# Patient Record
Sex: Male | Born: 1940
Health system: Southern US, Academic
[De-identification: ages and names within clinical notes are randomized; demographics above are authoritative.]

## PROBLEM LIST (undated history)

## (undated) ENCOUNTER — Ambulatory Visit

## (undated) ENCOUNTER — Encounter

## (undated) ENCOUNTER — Encounter
Attending: Pharmacist Clinician (PhC)/ Clinical Pharmacy Specialist | Primary: Pharmacist Clinician (PhC)/ Clinical Pharmacy Specialist

## (undated) ENCOUNTER — Encounter: Attending: Medical Oncology | Primary: Medical Oncology

## (undated) ENCOUNTER — Telehealth

## (undated) ENCOUNTER — Institutional Professional Consult (permissible substitution): Payer: MEDICARE

## (undated) ENCOUNTER — Telehealth: Attending: Medical Oncology | Primary: Medical Oncology

## (undated) ENCOUNTER — Encounter: Attending: Nurse Practitioner | Primary: Nurse Practitioner

## (undated) ENCOUNTER — Ambulatory Visit: Payer: MEDICARE

## (undated) ENCOUNTER — Encounter
Attending: Student in an Organized Health Care Education/Training Program | Primary: Student in an Organized Health Care Education/Training Program

## (undated) ENCOUNTER — Ambulatory Visit: Payer: MEDICARE | Attending: Medical Oncology | Primary: Medical Oncology

## (undated) ENCOUNTER — Ambulatory Visit
Payer: MEDICARE | Attending: Student in an Organized Health Care Education/Training Program | Primary: Student in an Organized Health Care Education/Training Program

## (undated) ENCOUNTER — Telehealth
Attending: Student in an Organized Health Care Education/Training Program | Primary: Student in an Organized Health Care Education/Training Program

## (undated) ENCOUNTER — Telehealth: Attending: Hematology & Oncology | Primary: Hematology & Oncology

## (undated) ENCOUNTER — Institutional Professional Consult (permissible substitution): Payer: MEDICARE | Attending: Medical Oncology | Primary: Medical Oncology

## (undated) ENCOUNTER — Ambulatory Visit: Attending: Radiation Oncology | Primary: Radiation Oncology

## (undated) ENCOUNTER — Ambulatory Visit
Payer: MEDICARE | Attending: Pharmacist Clinician (PhC)/ Clinical Pharmacy Specialist | Primary: Pharmacist Clinician (PhC)/ Clinical Pharmacy Specialist

## (undated) ENCOUNTER — Ambulatory Visit: Payer: MEDICARE | Attending: Hematology & Oncology | Primary: Hematology & Oncology

## (undated) ENCOUNTER — Encounter: Attending: Family Medicine | Primary: Family Medicine

## (undated) ENCOUNTER — Telehealth
Attending: Pharmacist Clinician (PhC)/ Clinical Pharmacy Specialist | Primary: Pharmacist Clinician (PhC)/ Clinical Pharmacy Specialist

## (undated) ENCOUNTER — Telehealth: Attending: Registered" | Primary: Registered"

## (undated) ENCOUNTER — Encounter: Attending: Internal Medicine | Primary: Internal Medicine

## (undated) ENCOUNTER — Encounter: Attending: Orthopaedic Surgery of the Spine | Primary: Orthopaedic Surgery of the Spine

## (undated) DIAGNOSIS — I10 Essential (primary) hypertension: Secondary | ICD-10-CM

## (undated) DIAGNOSIS — N419 Inflammatory disease of prostate, unspecified: Secondary | ICD-10-CM

## (undated) DIAGNOSIS — E785 Hyperlipidemia, unspecified: Secondary | ICD-10-CM

## (undated) HISTORY — PX: BACK SURGERY: SHX140

## (undated) MED ORDER — FERROUS SULFATE 325 MG (65 MG IRON) TABLET: Freq: Every day | ORAL | 0 days

## (undated) MED ORDER — IBUPROFEN 600 MG TABLET: Freq: Three times a day (TID) | ORAL | 0.00000 days | PRN

---
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
## 2006-08-30 ENCOUNTER — Emergency Department: Payer: Self-pay | Admitting: Emergency Medicine

## 2006-09-12 ENCOUNTER — Ambulatory Visit: Payer: Self-pay | Admitting: Urology

## 2007-02-02 ENCOUNTER — Emergency Department: Payer: Self-pay

## 2009-03-06 ENCOUNTER — Emergency Department: Payer: Self-pay | Admitting: Emergency Medicine

## 2011-08-18 ENCOUNTER — Ambulatory Visit: Payer: Self-pay | Admitting: Internal Medicine

## 2011-08-23 ENCOUNTER — Ambulatory Visit: Payer: Self-pay | Admitting: Oncology

## 2011-08-24 ENCOUNTER — Ambulatory Visit: Payer: Self-pay | Admitting: Oncology

## 2011-08-25 LAB — PROT IMMUNOELECTROPHORES(ARMC)

## 2011-08-25 LAB — CEA: CEA: 2.7 ng/mL (ref 0.0–4.7)

## 2011-09-24 ENCOUNTER — Ambulatory Visit: Payer: Self-pay | Admitting: Oncology

## 2012-05-27 ENCOUNTER — Emergency Department: Payer: Self-pay | Admitting: Internal Medicine

## 2012-05-27 LAB — COMPREHENSIVE METABOLIC PANEL
Alkaline Phosphatase: 127 U/L (ref 50–136)
Anion Gap: 13 (ref 7–16)
Bilirubin,Total: 0.5 mg/dL (ref 0.2–1.0)
Calcium, Total: 9.4 mg/dL (ref 8.5–10.1)
Chloride: 104 mmol/L (ref 98–107)
Co2: 25 mmol/L (ref 21–32)
Creatinine: 1.29 mg/dL (ref 0.60–1.30)
EGFR (African American): >60
EGFR (Non-African Amer.): 56 — ABNORMAL LOW
SGOT(AST): 35 U/L (ref 15–37)
SGPT (ALT): 26 U/L (ref 12–78)

## 2012-05-27 LAB — CBC
HCT: 40.2 % (ref 40.0–52.0)
HGB: 13.8 g/dL (ref 13.0–18.0)
MCV: 92 fL (ref 80–100)
RBC: 4.37 10*6/uL — ABNORMAL LOW (ref 4.40–5.90)
WBC: 9.6 10*3/uL (ref 3.8–10.6)

## 2012-05-27 LAB — URINALYSIS, COMPLETE
Ketone: NEGATIVE
Leukocyte Esterase: NEGATIVE
Nitrite: NEGATIVE
Ph: 6 (ref 4.5–8.0)
Protein: 75
Specific Gravity: 1.02 (ref 1.003–1.030)
WBC UR: NONE SEEN /HPF (ref 0–5)

## 2012-05-27 LAB — LIPASE, BLOOD: Lipase: 172 U/L (ref 73–393)

## 2013-11-06 ENCOUNTER — Inpatient Hospital Stay: Payer: Self-pay | Admitting: Urology

## 2013-11-06 LAB — COMPREHENSIVE METABOLIC PANEL
ALK PHOS: 108 U/L
AST: 30 U/L (ref 15–37)
Albumin: 4.2 g/dL (ref 3.4–5.0)
Anion Gap: 6 — ABNORMAL LOW (ref 7–16)
BUN: 29 mg/dL — ABNORMAL HIGH (ref 7–18)
Bilirubin,Total: 0.5 mg/dL (ref 0.2–1.0)
Calcium, Total: 9 mg/dL (ref 8.5–10.1)
Chloride: 102 mmol/L (ref 98–107)
Co2: 24 mmol/L (ref 21–32)
Creatinine: 3.02 mg/dL — ABNORMAL HIGH (ref 0.60–1.30)
EGFR (Non-African Amer.): 20 — ABNORMAL LOW
GFR CALC AF AMER: 23 — AB
GLUCOSE: 99 mg/dL (ref 65–99)
OSMOLALITY: 270 (ref 275–301)
POTASSIUM: 3.9 mmol/L (ref 3.5–5.1)
SGPT (ALT): 21 U/L (ref 12–78)
SODIUM: 132 mmol/L — AB (ref 136–145)
Total Protein: 9 g/dL — ABNORMAL HIGH (ref 6.4–8.2)

## 2013-11-06 LAB — URINALYSIS, COMPLETE
Bilirubin,UR: NEGATIVE
GLUCOSE, UR: NEGATIVE mg/dL (ref 0–75)
Ketone: NEGATIVE
Nitrite: NEGATIVE
PH: 5 (ref 4.5–8.0)
RBC,UR: 893 /HPF (ref 0–5)
Specific Gravity: 1.01 (ref 1.003–1.030)
Squamous Epithelial: <1
WBC UR: 10 /HPF (ref 0–5)

## 2013-11-06 LAB — CBC
HCT: 42.6 % (ref 40.0–52.0)
HGB: 14.1 g/dL (ref 13.0–18.0)
MCH: 30.6 pg (ref 26.0–34.0)
MCHC: 33.1 g/dL (ref 32.0–36.0)
MCV: 92 fL (ref 80–100)
Platelet: 243 10*3/uL (ref 150–440)
RBC: 4.61 10*6/uL (ref 4.40–5.90)
RDW: 13.7 % (ref 11.5–14.5)
WBC: 7.8 10*3/uL (ref 3.8–10.6)

## 2013-11-06 LAB — CK TOTAL AND CKMB (NOT AT ARMC)
CK, TOTAL: 705 U/L — AB
CK-MB: 0.5 ng/mL (ref 0.5–3.6)

## 2013-11-06 LAB — TROPONIN I

## 2013-11-07 LAB — BASIC METABOLIC PANEL
Anion Gap: 5 — ABNORMAL LOW (ref 7–16)
BUN: 28 mg/dL — ABNORMAL HIGH (ref 7–18)
CHLORIDE: 103 mmol/L (ref 98–107)
CO2: 27 mmol/L (ref 21–32)
Calcium, Total: 8.6 mg/dL (ref 8.5–10.1)
Creatinine: 3.05 mg/dL — ABNORMAL HIGH (ref 0.60–1.30)
EGFR (African American): 23 — ABNORMAL LOW
EGFR (Non-African Amer.): 20 — ABNORMAL LOW
GLUCOSE: 92 mg/dL (ref 65–99)
Osmolality: 275 (ref 275–301)
POTASSIUM: 4.1 mmol/L (ref 3.5–5.1)
Sodium: 135 mmol/L — ABNORMAL LOW (ref 136–145)

## 2013-11-07 LAB — CBC WITH DIFFERENTIAL/PLATELET
BASOS PCT: 0.7 %
Basophil #: 0 10*3/uL (ref 0.0–0.1)
Eosinophil #: 0.2 10*3/uL (ref 0.0–0.7)
Eosinophil %: 4.1 %
HCT: 37 % — ABNORMAL LOW (ref 40.0–52.0)
HGB: 12.4 g/dL — ABNORMAL LOW (ref 13.0–18.0)
LYMPHS ABS: 1.5 10*3/uL (ref 1.0–3.6)
LYMPHS PCT: 30.1 %
MCH: 30.9 pg (ref 26.0–34.0)
MCHC: 33.6 g/dL (ref 32.0–36.0)
MCV: 92 fL (ref 80–100)
Monocyte #: 0.8 x10 3/mm (ref 0.2–1.0)
Monocyte %: 15.8 %
Neutrophil #: 2.5 10*3/uL (ref 1.4–6.5)
Neutrophil %: 49.3 %
Platelet: 187 10*3/uL (ref 150–440)
RBC: 4.03 10*6/uL — AB (ref 4.40–5.90)
RDW: 13.8 % (ref 11.5–14.5)
WBC: 5 10*3/uL (ref 3.8–10.6)

## 2013-11-08 LAB — BASIC METABOLIC PANEL
ANION GAP: 5 — AB (ref 7–16)
BUN: 21 mg/dL — ABNORMAL HIGH (ref 7–18)
CALCIUM: 8.3 mg/dL — AB (ref 8.5–10.1)
CO2: 26 mmol/L (ref 21–32)
Chloride: 107 mmol/L (ref 98–107)
Creatinine: 2.44 mg/dL — ABNORMAL HIGH (ref 0.60–1.30)
EGFR (African American): 30 — ABNORMAL LOW
GFR CALC NON AF AMER: 26 — AB
Glucose: 79 mg/dL (ref 65–99)
Osmolality: 278 (ref 275–301)
POTASSIUM: 4.4 mmol/L (ref 3.5–5.1)
SODIUM: 138 mmol/L (ref 136–145)

## 2013-11-08 LAB — URINE CULTURE

## 2013-11-08 LAB — MAGNESIUM: MAGNESIUM: 1.8 mg/dL

## 2013-11-09 LAB — BASIC METABOLIC PANEL
ANION GAP: 4 — AB (ref 7–16)
BUN: 17 mg/dL (ref 7–18)
CREATININE: 1.72 mg/dL — AB (ref 0.60–1.30)
Calcium, Total: 8.4 mg/dL — ABNORMAL LOW (ref 8.5–10.1)
Chloride: 109 mmol/L — ABNORMAL HIGH (ref 98–107)
Co2: 27 mmol/L (ref 21–32)
EGFR (African American): 45 — ABNORMAL LOW
GFR CALC NON AF AMER: 39 — AB
GLUCOSE: 84 mg/dL (ref 65–99)
Osmolality: 280 (ref 275–301)
Potassium: 4.3 mmol/L (ref 3.5–5.1)
Sodium: 140 mmol/L (ref 136–145)

## 2013-11-15 ENCOUNTER — Ambulatory Visit: Payer: Self-pay | Admitting: Urology

## 2013-12-26 ENCOUNTER — Ambulatory Visit: Payer: Self-pay | Admitting: Urology

## 2014-01-10 ENCOUNTER — Ambulatory Visit: Payer: Self-pay | Admitting: Urology

## 2014-01-22 ENCOUNTER — Ambulatory Visit: Payer: Self-pay | Admitting: Urology

## 2014-01-22 LAB — CBC WITH DIFFERENTIAL/PLATELET
Basophil #: 0 10*3/uL (ref 0.0–0.1)
Basophil %: 0.8 %
EOS PCT: 4.3 %
Eosinophil #: 0.2 10*3/uL (ref 0.0–0.7)
HCT: 37.4 % — ABNORMAL LOW (ref 40.0–52.0)
HGB: 12.4 g/dL — AB (ref 13.0–18.0)
Lymphocyte #: 1.8 10*3/uL (ref 1.0–3.6)
Lymphocyte %: 49.2 %
MCH: 30.1 pg (ref 26.0–34.0)
MCHC: 33.1 g/dL (ref 32.0–36.0)
MCV: 91 fL (ref 80–100)
Monocyte #: 0.5 x10 3/mm (ref 0.2–1.0)
Monocyte %: 12.6 %
NEUTROS ABS: 1.2 10*3/uL — AB (ref 1.4–6.5)
Neutrophil %: 33.1 %
Platelet: 142 10*3/uL — ABNORMAL LOW (ref 150–440)
RBC: 4.12 10*6/uL — ABNORMAL LOW (ref 4.40–5.90)
RDW: 15.5 % — ABNORMAL HIGH (ref 11.5–14.5)
WBC: 3.7 10*3/uL — ABNORMAL LOW (ref 3.8–10.6)

## 2014-01-22 LAB — APTT: Activated PTT: 26.4 secs (ref 23.6–35.9)

## 2014-01-22 LAB — POTASSIUM: Potassium: 3.6 mmol/L (ref 3.5–5.1)

## 2014-01-22 LAB — PROTIME-INR
INR: 1.1
Prothrombin Time: 14.2 secs (ref 11.5–14.7)

## 2014-01-23 LAB — CBC WITH DIFFERENTIAL/PLATELET
Basophil #: 0 10*3/uL (ref 0.0–0.1)
Basophil %: 0.1 %
Eosinophil #: 0 10*3/uL (ref 0.0–0.7)
Eosinophil %: 0 %
HCT: 30.8 % — AB (ref 40.0–52.0)
HGB: 10.1 g/dL — AB (ref 13.0–18.0)
Lymphocyte #: 1.1 10*3/uL (ref 1.0–3.6)
Lymphocyte %: 12.2 %
MCH: 30 pg (ref 26.0–34.0)
MCHC: 32.8 g/dL (ref 32.0–36.0)
MCV: 91 fL (ref 80–100)
Monocyte #: 0.8 x10 3/mm (ref 0.2–1.0)
Monocyte %: 8.8 %
NEUTROS ABS: 7.2 10*3/uL — AB (ref 1.4–6.5)
Neutrophil %: 78.9 %
Platelet: 148 10*3/uL — ABNORMAL LOW (ref 150–440)
RBC: 3.37 10*6/uL — ABNORMAL LOW (ref 4.40–5.90)
RDW: 15.6 % — ABNORMAL HIGH (ref 11.5–14.5)
WBC: 9.1 10*3/uL (ref 3.8–10.6)

## 2014-01-23 LAB — MAGNESIUM: Magnesium: 1.6 mg/dL — ABNORMAL LOW

## 2014-01-23 LAB — BASIC METABOLIC PANEL
ANION GAP: 3 — AB (ref 7–16)
BUN: 20 mg/dL — ABNORMAL HIGH (ref 7–18)
CREATININE: 1.47 mg/dL — AB (ref 0.60–1.30)
Calcium, Total: 7.9 mg/dL — ABNORMAL LOW (ref 8.5–10.1)
Chloride: 108 mmol/L — ABNORMAL HIGH (ref 98–107)
Co2: 26 mmol/L (ref 21–32)
EGFR (Non-African Amer.): 47 — ABNORMAL LOW
GFR CALC AF AMER: 54 — AB
GLUCOSE: 127 mg/dL — AB (ref 65–99)
Osmolality: 278 (ref 275–301)
Potassium: 4.4 mmol/L (ref 3.5–5.1)
Sodium: 137 mmol/L (ref 136–145)

## 2014-12-14 NOTE — Discharge Summary (Signed)
PATIENT NAME:  [name] [name], [name] [name] MR#:  [id number] DATE OF BIRTH:  Aug 12, 1941  DATE OF ADMISSION:  11/06/2013 DATE OF DISCHARGE:  11/09/2013  PRINCIPAL DIAGNOSIS: Bilateral nephrolithiasis, right ureterolithiasis with obstruction, acute renal failure.   PROCEDURE: Right ureteroscopy with holmium laser lithotripsy, right double-J ureteral stent placement.   INDICATION: The patient is a 74 year old gentleman with a long history of uric acid nephrolithiasis. He presented to the Emergency Room with right-sided flank pain. A CT scan was obtained demonstrating a large segment of stone obstructing the mid right ureter. The segment was approximately 2.3 to 2.5 cm in length. He also had a large staghorn left calculus compromising renal function. There were other stones also within the right kidney. His serum creatinine had risen to 3. He was admitted for IV hydration, observation and intervention as indicated.   HOSPITAL COURSE: [name] [name] was admitted on 11/06/2013. He was placed on IV hydration and antibiotic therapy. The following morning labs demonstrated no significant change in his overall renal function status. Due to the large obstructing stone within the right ureter and the large left staghorn calculus with renal compromise, the decision was made to proceed with ureteroscopic stone removal. He was taken to the operating room on 11/07/2013 at which time he underwent right ureteroscopy with holmium laser lithotripsy and stent placement. There were no intraoperative problems or complications. His postoperative course was likewise without significant problems or complications. He remained afebrile, vital signs stable. He had minimal pain and discomfort. He had minimal hematuria. He had minimal overall discomfort related to the stent. He remained afebrile, vital signs stable throughout his hospitalization. His pain was easily controlled on oral pain medication. He had subsequent gradual reduction in his serum  creatinine over the course of the hospitalization. It had decreased to 1.72 by March 20th. This demonstrated a significant continued decrease with the stent placement. He was subsequently discharged to home on his usual admission medications in addition to Cipro 500 mg for a 5 day course and Percocet 1 to 2 every 4 to 6 hours as needed for pain. He is to follow up in 1 to 2 weeks for cystoscopy and stent removal. He will need eventual intervention regarding the left staghorn calculus. He is to notify us if there are any further problems or questions in the interim.   ____________________________ Denice Bors Jacqlyn Larsen, MD bsc:sb D: 11/13/2013 12:11:54 ET T: 11/13/2013 12:31:17 ET JOB#: [id number]  cc: Denice Bors. Jacqlyn Larsen, MD, <Dictator> Denice Bors Cruz Devilla MD ELECTRONICALLY SIGNED 11/13/2013 05:69

## 2014-12-14 NOTE — Consult Note (Signed)
Pt doing well.UOP, minimal bloodcontrolled on oral pain meds.Good.close PCN today.CVAT.no pain, will remove PCN tomorrow.to chair today.post op care.  Electronic Signatures: Murrell Redden (MD)  (Signed on 03-Jun-15 07:55)  Authored  Last Updated: 03-Jun-15 07:55 by Murrell Redden (MD)

## 2014-12-14 NOTE — Op Note (Signed)
PATIENT NAME:  [name] [name], [name] [name] MR#:  [id number] DATE OF BIRTH:  12/21/1940  DATE OF PROCEDURE:  11/07/2013  PRINCIPAL DIAGNOSIS:  Right ureterolithiasis, acute renal failure, hydronephrosis.   POSTOPERATIVE DIAGNOSIS:  Right ureterolithiasis, acute renal failure, hydronephrosis.  PROCEDURE:  Right ureteroscopy with holmium laser lithotripsy, right double-J ureteral stent placement.   SURGEON:  Edrick Oh, M.D.   ANESTHESIA:  General endotracheal anesthesia.   INDICATIONS:  The patient is a 74 year old African American gentleman with metastatic prostate cancer controlled on hormonal manipulation.  He also has a history of uric acid nephrolithiasis.  He presented to the Emergency Room with right-sided flank pain.  A CT scan demonstrated a 2.3 cm x 6 mm stone just below the level of the crossing vessels on the right with moderate hydronephrosis.  There was no evidence of sepsis.  His serum creatinine was elevated over 3.0 with his baseline being around 1.1.  He was admitted for IV hydration and observation.  There was persistent elevation of his serum creatinine.  He also has a large staghorn calculus on the left.  He presents for ureteroscopic stone removal.   DESCRIPTION OF PROCEDURE:  After informed consent was obtained, the patient was taken to the Operating Room and placed in the dorsal lithotomy position under general endotracheal anesthesia.  The patient was then prepped and draped in the usual standard fashion.  The 20 French rigid cystoscope was introduced into the urethra under direct vision with no urethral abnormalities noted.  Upon entering the prostatic fossa, moderate bilobar hypertrophy was noted with only partial visual obstruction.  The prostate fossa was noted to be relatively short.  Upon entering the bladder, the mucosa was inspected in its entirety with no gross mucosal lesions noted.  Multiple small sand-like uric acid stone fragments were present on the bladder base.  Blood was  noted oozing from the right ureteral orifice.  The left ureteral orifice demonstrated no significant abnormalities.  A flexible tip Glidewire was introduced into the left ureteral orifice.  Approximately 2 to 3 cm inside the orifice resistance was encountered.  Multiple attempts were made at passing the guidewire and this was initially unsuccessful, however the cystoscope was removed, the 6 French rigid ureteroscope was advanced into the urinary bladder.  It was advanced into the right ureteral orifice utilizing the guidewire through the scope.  The scope was advanced into the distal ureter to the level of the stones.  The guidewire was able to be advanced past the stone into the upper pole collecting system under fluoroscopic guidance without further difficulty.  The ureteroscope was removed.  The guidewire was left in place.  The ureteroscope was then replaced into the ureter to the level of the stone.  The holmium laser fiber was then utilized to fragment a large lead stone.  The initial CT imaging indicated a possible solid stone.  The initial stone was fragmented into multiple smaller pieces.  It fragmented into essentially dust consistent with uric acid-type stone.  As the bulk of the larger stone was removed, multiple smaller stones were encountered.  As these were basket extracted free additional larger stones were encountered.  The ureter appeared to be filled with multiple small and larger stones to the level of the crossing vessels.  This was approximately 3 cm in length total.  Multiple passes were made with the basket for extraction of the stone fragments.  Significant edema was noted of the ureter.  After the bulk of the pieces had been basket extracted  the scope was advanced to the level of the ureteropelvic junction.  Prominent dilation was noted in the ureter proximal to the edge of the stones.  No other significant abnormalities were appreciated.  Due to the degree of edema and his renal failure  status the decision was made to proceed with stent placement.  The ureteroscope was removed.  The cystoscope was replaced into the urinary bladder, backloaded over the guidewire.  A 6 French x 24 cm double-J ureteral stent was advanced over the guidewire into the upper pole collecting system under fluoroscopic and visual guidance.  Adequate curl was noted within the renal pelvis.  Upon withdrawal of the guidewire, adequate curl was also noted within the urinary bladder.  The bladder was irrigated free of the multiple stone fragments.  These were collected and will be sent for stone analysis.  The bladder was then drained.  The cystoscope was removed.  The patient was returned to the supine position and awakened from general endotracheal anesthesia.  He was taken to the recovery room in stable condition.  There were no problems or complications.  The patient tolerated the procedure well.    ____________________________ Denice Bors. Jacqlyn Larsen, MD bsc:ea D: 11/08/2013 19:59:00 ET T: 11/09/2013 03:35:51 ET JOB#: [id number]  cc: Denice Bors. Jacqlyn Larsen, MD, <Dictator> Denice Bors. Jacqlyn Larsen, MD Denice Bors [name] MD ELECTRONICALLY SIGNED 11/09/2013 12:37

## 2014-12-14 NOTE — Op Note (Signed)
PATIENT NAME:  [name] [name], [name] [name] MR#:  [id number] DATE OF BIRTH:  October 16, 1940  DATE OF PROCEDURE:  01/22/2014  PRINCIPAL DIAGNOSIS: Left uric acid nephrolithiasis.   POSTOPERATIVE DIAGNOSIS: Left uric acid nephrolithiasis.   PROCEDURE: Left percutaneous nephrolithotomy.   SURGEON: Edrick Oh, MD   ANESTHESIA: General endotracheal anesthesia.   INDICATIONS: The patient is a 74 year old gentleman who presented recently with right-sided ureteral obstruction related to uric acid stones. He underwent ureteroscopic stone removal. He was noted to have significant effect on renal function. He was noted to have a large stone burden with multiple large stones within the left kidney with minimal obstruction. He has recovered from his initial right stone treatment. He presents for percutaneous nephrolithotomy due to the large stone burden within the left kidney. He underwent nephroureteral stent placement by interventional radiology earlier in the morning. He presents for this purpose.   DESCRIPTION OF PROCEDURE: After informed consent was obtained, the patient was taken to the operating room and placed in the prone position on the operating table under general endotracheal anesthesia. The patient was then prepped and draped in the usual standard fashion. The previously placed nephroureteral stent was utilized. A flexible tip Glidewire was introduced through the nephroureteral stent into the urinary bladder. The nephroureteral stent was then removed under fluoroscopic guidance and dilators were then utilized to dilate the tract from 10-French to 14-French. The stiffener was then placed over the guidewire. The sheath was then placed over the stiffener. A Super Stiff guidewire was advanced through the sheath into the urinary bladder under fluoroscopic guidance. The sheath was then removed. The stiffener was then replaced over the Super Stiff guidewire. The flexible Glidewire was placed to the side as a safety wire. An  approximate 1 cm skin incision was made utilizing a knife. The dilation was then undertaken utilizing sequential dilators from 16-French to 28-French. The sheath was left on the 28-French dilator. The sheath was then advanced into the kidney on initial examination. Multiple very small stones  were encountered.  Many of these were very small and irrigated free through the sheath. Some clot was present within the calyces. This was removed utilizing the ultrasonic device and suction. An approximate 8 mm to 10 mm stone was encountered. This was then removed utilizing the ultrasonic lithotripter. An approximate 2 cm to 2.5 cm stone was then encountered within the renal pelvic region. This was also removed utilizing the ultrasonic lithotriptor. At least 2 additional stones were noted in calyces in very close proximity. Each was also approximately 1 cm in size. These were also removed utilizing the ultrasonic lithotripter. The scope was advanced into the lower pole calyces. Several additional 1 cm stones were identified. Multiple small stones were present. Most of these were either suctioned with the ultrasonic device or removed with irrigation. The larger stones were fragmented. One of the smaller stones was removed for stone analysis. This was performed utilizing graspers. Several additional fragments were also removed utilizing graspers for stone analysis. The scope was unable to be advanced into the more upper pole portion of the kidney with the bulk of the stones and fragments removed. A flexible ureteroscope was advanced into the upper pole calyces. One small stone was present in the upper pole calyx. This was washed back into the renal pelvis. There was an additional area of a calyx which could not be easily identified due to compression from a large upper pole renal cyst. Based on imaging, a small stone was present within this calyx. This  was unable to be accessed. The scope was removed. The rigid scope was  replaced back into the renal pelvis. Several additional smaller stones and fragments were removed utilizing the ultrasonic probe. There was 1 fragment approximately 6 mm in the proximal ureter. This was able to be removed utilizing grasping forceps through the rigid scope. After the bulk of the fragments and tiny stones were removed, the decision was made to place a stent. It was initially attempted over a second glidewire placed through the scope into the ureter and urinary bladder. As the stent was advanced, resistance was met in the distal ureter. We were not able to get adequate curl as had been hoped. The stent was removed utilizing grasping forceps. The guidewire was replaced back into the ureter. The stent was advanced through the scope under direct vision instead of fluoroscopic guidance. The guidewire was able to be advanced into the urinary bladder. The curl within the bladder was more than sufficient. The curl, however, in the renal pelvis was only partial. Several attempts were made at grasping the stent utilizing grasping forceps. This was difficult due to angulation. The decision was made to leave the stent at its current location due to concerns over potential injury from continued scope angulation. The scope was then removed. A 10-French nephrostomy tube was then placed through the sheath. The tip was present within the renal pelvis as the string was pulled to provide curl. Some resistance was met. Manipulation of the nephrostomy tube was undertaken with subsequent curl. Bright red blood was noted coming through the sheath and nephrostomy tube. The amount of bleeding and color did provide concern. As a result, the nephrostomy tube was removed. The [facility] tamponade balloon catheter was then placed through the sheath. The sheath was then removed over and around the tamponade catheter. The catheter was then inflated to maximum pressure. It was held for approximately 15 minutes. The tamponade catheter was  then deflated. No significant bleeding was noted. It was observed that the previously placed nephrostomy tube did not have the safety tip on the end as it should. It was noted that this was not present at the time of initial insertion. A second nephrostomy tube was obtained that did have the safety tip in place. This was placed without difficulty under fluoroscopic guidance. Good curl was noted within the renal pelvis. No significant bleeding was encountered. A nephrostogram was then performed demonstrating no evidence of extravasation except along the tract region. Contrast was noted to easily go down the ureter. The nephrostomy tube was secured to the skin utilizing a 2-0 Prolene suture. The skin was closed utilizing 3 interrupted 2-0 Prolene sutures. The remaining safety wire was then removed under fluoroscopy. The patient was returned to the supine position after placing a dressing over the site. He was then awakened from general endotracheal anesthesia and taken to the recovery room in stable condition.  The complication was bleeding after placement of the nephrostomy tube. This was controlled with a tamponade catheter and new catheter was placed.   ESTIMATED BLOOD LOSS: Approximately 250 mL.   SPECIMEN: Multiple fragments of uric acid stone.    ____________________________ Denice Bors Jacqlyn Larsen, MD bsc:dd D: 01/22/2014 28:00:34 ET T: 01/22/2014 21:38:26 ET JOB#: [id number]  cc: Denice Bors. Jacqlyn Larsen, MD, <Dictator> Denice Bors Serafin Decatur MD ELECTRONICALLY SIGNED 01/23/2014 21:26

## 2015-06-29 IMAGING — CT CT STONE STUDY
2 of 4 series · 15 of 46 positions shown, 17 images · non-contrast
Comparison: 05/27/2012

CLINICAL DATA: Sharp right flank pain. Hematuria. nausea and
vomiting.

EXAM:
CT ABDOMEN AND PELVIS WITHOUT CONTRAST
TECHNIQUE: Multidetector CT imaging of the abdomen and pelvis was performed
following the standard protocol without IV contrast.

[Series 2: stone standard full · axial · 0.72mm/px · z∈[-855,-490]mm · 12 of 84 slices shown, 14 images]
[im 7/84  soft-tissue]
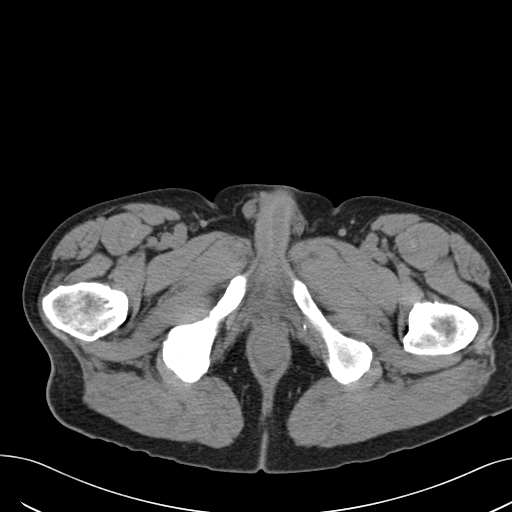
[im 7/84  bone]
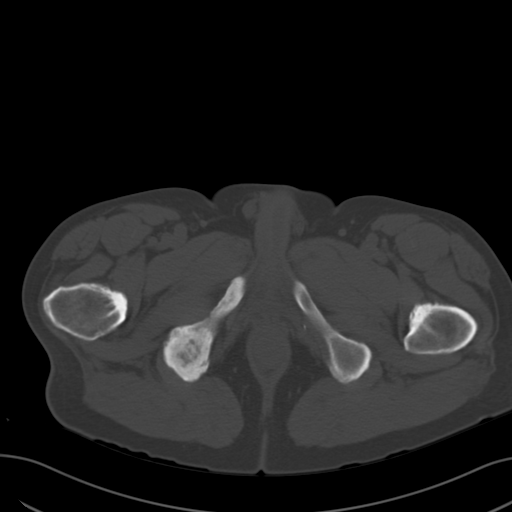
[im 14/84  soft-tissue]
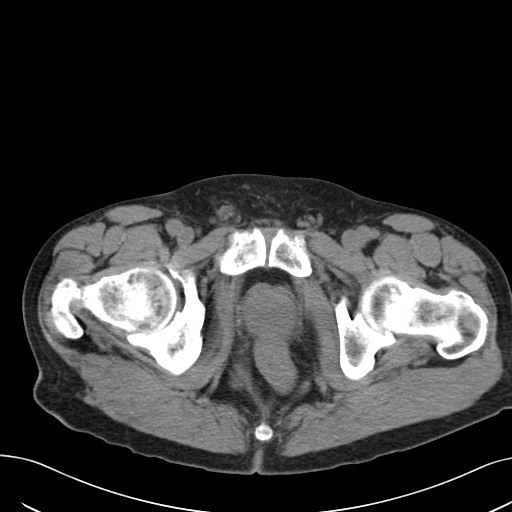
[im 20/84  soft-tissue]
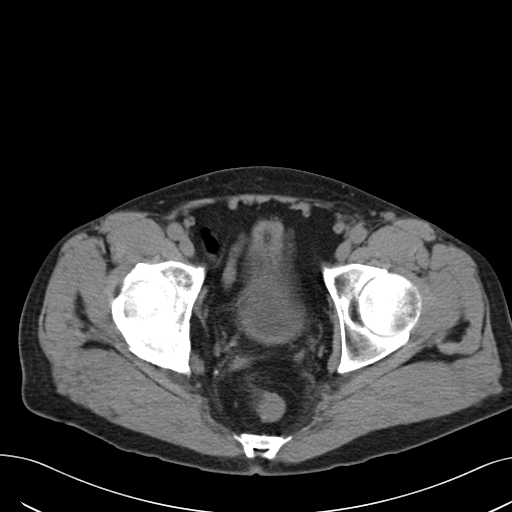
[im 27/84  soft-tissue]
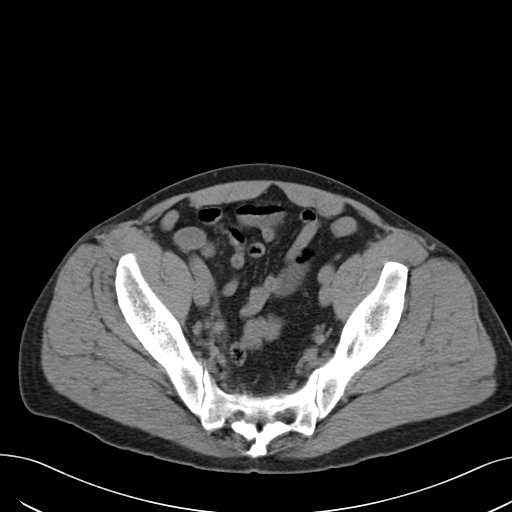
[im 34/84  soft-tissue]
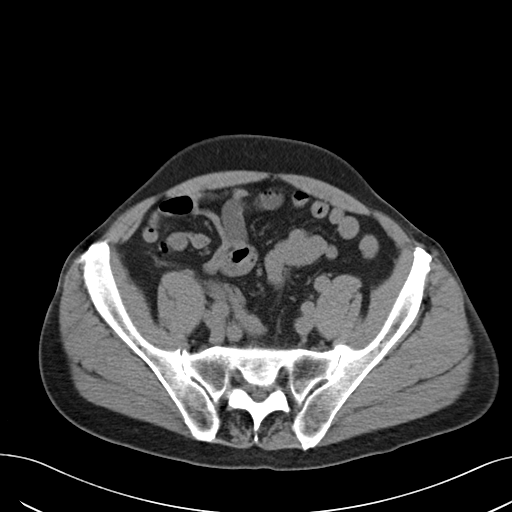
[im 40/84  soft-tissue]
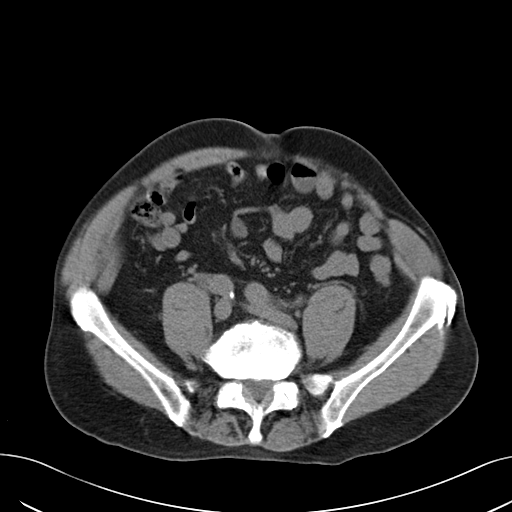
[im 47/84  soft-tissue]
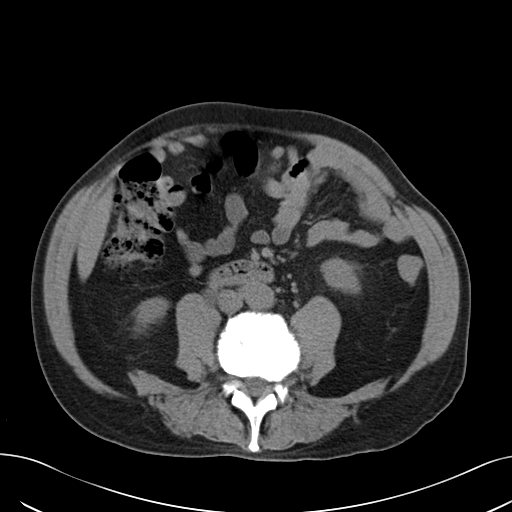
[im 54/84  soft-tissue]
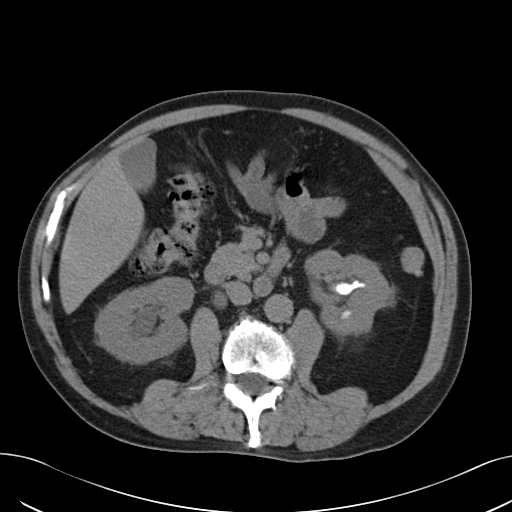
[im 60/84  soft-tissue]
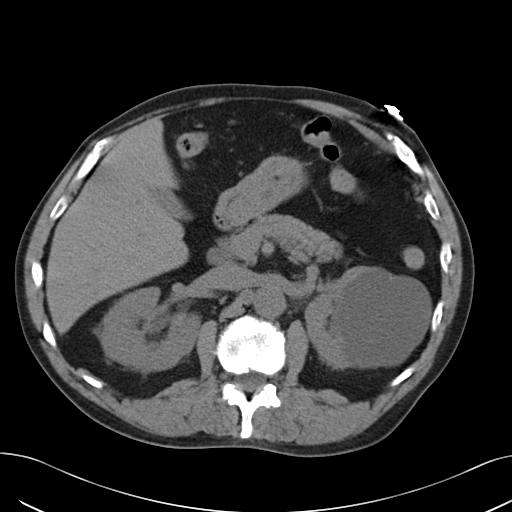
[im 60/84  bone]
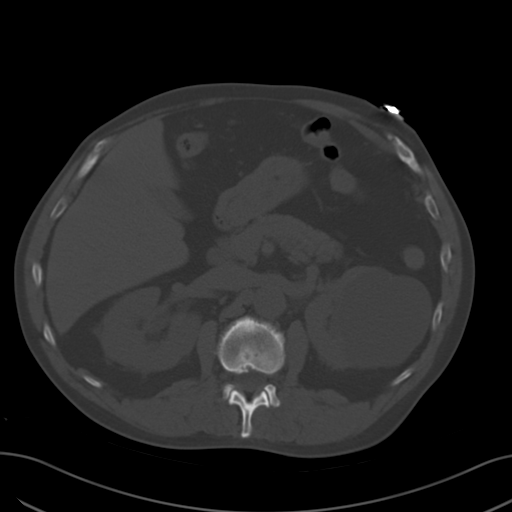
[im 67/84  soft-tissue]
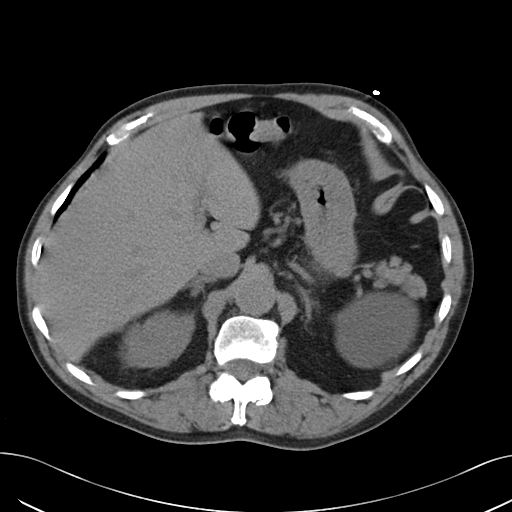
[im 74/84  soft-tissue]
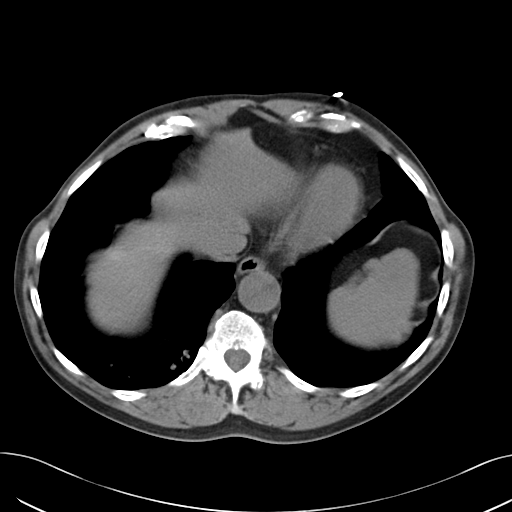
[im 80/84  soft-tissue]
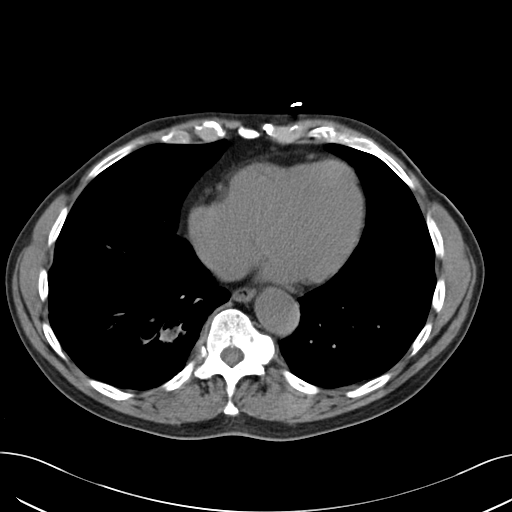

[Series 5: cor stone standard full · coronal · 0.63mm/px · 3 of 134 slices shown]
[im 45/134  soft-tissue]
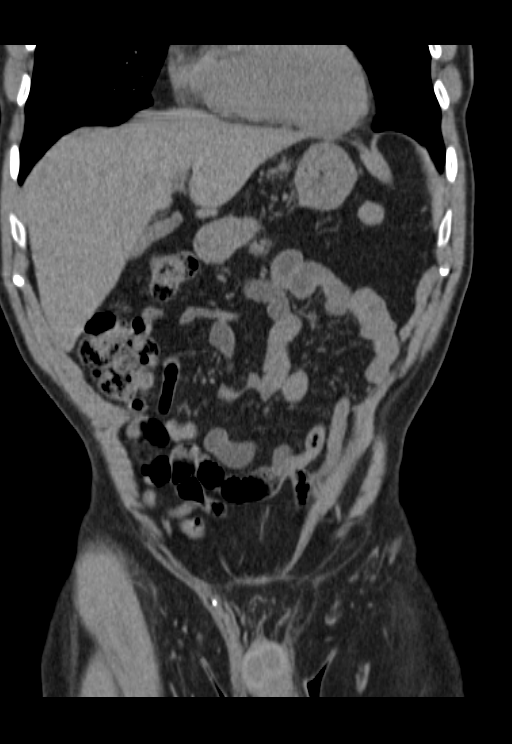
[im 60/134  soft-tissue]
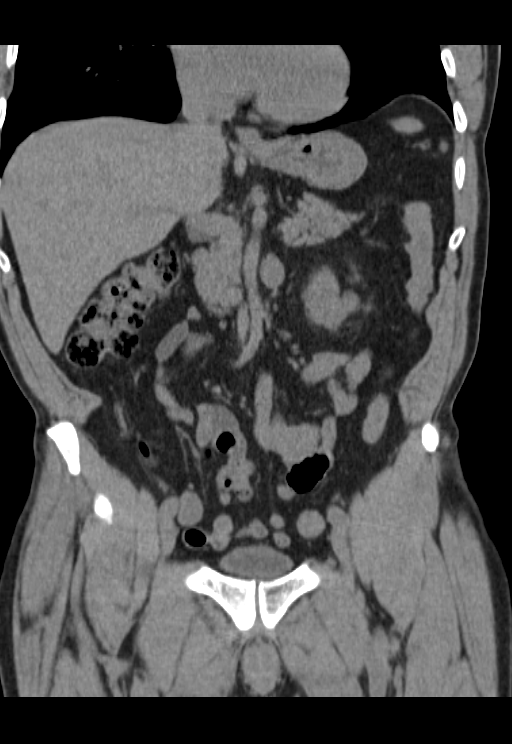
[im 74/134  soft-tissue]
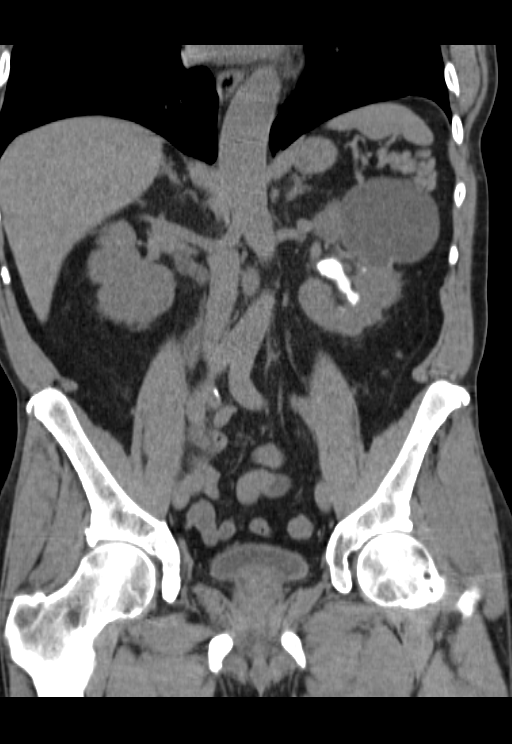

[15 of 46 positions shown; findings below may reference images not displayed]

FINDINGS: Images through the lung bases again show chronic bronchiectasis and
associated bronchoceles in the right lower lobe.

Left renal staghorn calculus is seen nearly completely filling the
the left intrarenal collecting system, however there is no evidence
of left-sided hydronephrosis. Large fluid attenuation cyst again
seen in the left kidney.

Small less than 1 cm calculi noted in the lower pole of the right
kidney. Mild to moderate right hydronephrosis and ureterectasis is
seen which is new since previous study. There is a large calculus or
multiple adjacent calculi seen in the distal right ureter measuring
approximately 5 mm in diameter and extending over a length of
approximately 2.3 cm.

Noncontrast images of the liver, gallbladder, pancreas, spleen, and
adrenal glands are normal in appearance. No soft tissue masses or
lymphadenopathy identified. No evidence of inflammatory process or
abnormal fluid collections. Normal appendix is visualized. No
evidence of dilated bowel loops. Left colonic diverticulosis is
noted, however there is no evidence of diverticulitis. Previously
seen sclerotic bone lesions involving the pelvis and lumbar spine
show improvement since previous study.
IMPRESSION: Mild to moderate right hydronephrosis due to distal right ureteral
calculi.

Left renal staghorn calculus. No evidence of left-sided
hydronephrosis.

## 2017-02-21 ENCOUNTER — Ambulatory Visit
Admission: RE | Admit: 2017-02-21 | Discharge: 2017-02-21 | Disposition: A | Discharge status: Home / Self Care | Payer: MEDICARE

## 2017-02-21 DIAGNOSIS — K869 Disease of pancreas, unspecified: Principal | ICD-10-CM

## 2017-02-21 DIAGNOSIS — C61 Malignant neoplasm of prostate: Secondary | ICD-10-CM

## 2017-02-21 DIAGNOSIS — D3A8 Other benign neuroendocrine tumors: Secondary | ICD-10-CM

## 2017-03-08 MED ORDER — TAMSULOSIN 0.4 MG CAPSULE
ORAL_CAPSULE | 3 refills | 0 days | Status: CP
Start: 2017-03-08 — End: 2018-06-16

## 2017-03-08 MED ORDER — BICALUTAMIDE 50 MG TABLET
ORAL_TABLET | 4 refills | 0 days | Status: CP
Start: 2017-03-08 — End: 2017-12-15

## 2017-03-10 NOTE — Unmapped (Signed)
Specialty Pharmacy Refill Coordination Note     [name] is a 76 y.o. male contacted today regarding refills of his specialty medication(s).    Reviewed and verified with patient:     [address]  [place] [address]  Specialty medication(s) and dose(s) confirmed: yes  Changes to medications: no  Changes to insurance: no    Medication Adherence    Patient reported X missed doses in the last month:  0  Specialty Medication:  PREDNISONE 5 MG  Patient is on additional specialty medications:  Yes  Additional Specialty Medications:  ZYTIGA   Patient Reported Additional Medication X Missed Doses in the Last Month:  0  Medication Assistance Program  Refill Coordination  Has the Patient's Contact Information Changed:  No  Is the Shipping Address Different:  No  Shipping Information  Delivery Scheduled:  Yes  Delivery Date:  03/15/17  Medications to be Shipped:  PREDNISONE 5 MG  ZYTIGA 250 MG          Prednisone 5 mg   Quantity filled last month: 60   # of tablets left on hand: 14     Lavonda Jumbo

## 2017-03-14 MED FILL — PREDNISONE/5MG/TAB: PREDNISONE/5MG/TAB | 30 days supply | Qty: 60 | Fill #4

## 2017-03-14 MED FILL — ZYTIGA/250MG/TAB: ZYTIGA/250MG/TAB | 30 days supply | Qty: 120 | Fill #4

## 2017-04-07 NOTE — Unmapped (Signed)
[facility] Pharm[facility] Refill Coordination Note  Specialty Medication(s): Zytiga  Additional Medications shipped: prednisone    [name], DOB: 10-Sep-1940  Phone: [phone] (home) , Alternate phone contact: N/A  Phone or address changes today?: No  All above HIPAA information was verified with patient.  Shipping Address: [address]  [place] [address]   Insurance changes? No    Completed refill call assessment today to schedule patient's medication shipment from the [facility] [phone]).      Confirmed the medication and dosage are correct and have not changed: Yes, regimen is correct and unchanged.    Confirmed patient started or stopped the following medications in the past month:  No, there are no changes reported at this time.    Are you tolerating your medication?:  [name] reports tolerating the medication.    ADHERENCE    Did you miss any doses in the past 4 weeks? Yes.  [name] reports missing 1 days of medication therapy in the last 4 weeks.  [name] reports pills getting stuck in throat as the cause of their non-adherance. - * he does report this was one time only and is feeling better now    FINANCIAL/SHIPPING    Delivery Scheduled: Yes, Expected medication delivery date: Tues, Aug 21     [name] did not have any additional questions at this time.    Delivery address validated in [facility] scheduling system: Yes, address listed in [facility] is correct.    We will follow up with patient monthly for standard refill processing and delivery.      Thank you,  Tawanna Solo Shared [facility] Specialty Pharmacist

## 2017-04-11 MED FILL — ZYTIGA/250MG/TAB: ZYTIGA/250MG/TAB | 30 days supply | Qty: 120 | Fill #5

## 2017-04-11 MED FILL — PREDNISONE/5MG/TAB: PREDNISONE/5MG/TAB | 30 days supply | Qty: 60 | Fill #5

## 2017-05-17 MED FILL — PREDNISONE/5MG/TAB: PREDNISONE/5MG/TAB | 30 days supply | Qty: 60 | Fill #6

## 2017-05-17 MED FILL — ZYTIGA/250MG/TAB: ZYTIGA/250MG/TAB | 30 days supply | Qty: 120 | Fill #6

## 2017-05-17 NOTE — Unmapped (Signed)
Specialty Pharmacy Refill Coordination Note     [name] is a 76 y.o. male contacted today regarding refills of his specialty medication(s).    Reviewed and verified with patient:     [address]  [place] [address]    Specialty medication(s) and dose(s) confirmed: yes  Changes to medications: no  Changes to insurance: no    Medication Adherence    Patient reported X missed doses in the last month:  0  Specialty Medication:  Zytiga 250mg   Patient is on additional specialty medications:  No  Demonstrates understanding of importance of adherence:  yes  Informant:  patient  Reliability of informant:  reliable  Provider-estimated medication adherence level:  90-100%  Confirmed plan for next specialty medication refill:  delivery by pharmacy         Refill Coordination    Has the Patients' Contact Information Changed:  No  Is the Shipping Address Different:  No       Shipping Information    Delivery Scheduled:  Yes  Delivery Date:  05/19/17  Medications to be Shipped:  Zytiga 250mg , Prednisone 5mg               Alfredia Ferguson

## 2017-05-19 ENCOUNTER — Ambulatory Visit
Admission: RE | Admit: 2017-05-19 | Discharge: 2017-05-19 | Disposition: A | Discharge status: Home / Self Care | Payer: MEDICARE

## 2017-05-19 DIAGNOSIS — C61 Malignant neoplasm of prostate: Principal | ICD-10-CM

## 2017-05-19 LAB — TESTOSTERONE TOTAL: Testosterone:MCnc:Pt:Ser/Plas:Qn:: 11 — ABNORMAL LOW

## 2017-05-19 LAB — CBC W/ AUTO DIFF
BASOPHILS ABSOLUTE COUNT: 0 10*9/L (ref 0.0–0.1)
EOSINOPHILS ABSOLUTE COUNT: 0.1 10*9/L (ref 0.0–0.4)
HEMATOCRIT: 38.9 % — ABNORMAL LOW (ref 41.0–53.0)
HEMOGLOBIN: 12.5 g/dL — ABNORMAL LOW (ref 13.5–17.5)
LARGE UNSTAINED CELLS: 1 % (ref 0–4)
LYMPHOCYTES ABSOLUTE COUNT: 1.9 10*9/L (ref 1.5–5.0)
MEAN CORPUSCULAR HEMOGLOBIN CONC: 32 g/dL (ref 31.0–37.0)
MEAN CORPUSCULAR HEMOGLOBIN: 30.4 pg (ref 26.0–34.0)
MEAN CORPUSCULAR VOLUME: 95.2 fL (ref 80.0–100.0)
MEAN PLATELET VOLUME: 7.9 fL (ref 7.0–10.0)
NEUTROPHILS ABSOLUTE COUNT: 4.2 10*9/L (ref 2.0–7.5)
PLATELET COUNT: 212 10*9/L (ref 150–440)
RED BLOOD CELL COUNT: 4.09 10*12/L — ABNORMAL LOW (ref 4.50–5.90)
RED CELL DISTRIBUTION WIDTH: 14.4 % (ref 12.0–15.0)
WBC ADJUSTED: 6.8 10*9/L (ref 4.5–11.0)

## 2017-05-19 LAB — COMPREHENSIVE METABOLIC PANEL
ALBUMIN: 4.4 g/dL (ref 3.5–5.0)
ALKALINE PHOSPHATASE: 71 U/L (ref 38–126)
ALT (SGPT): 36 U/L (ref 19–72)
AST (SGOT): 23 U/L (ref 19–55)
BILIRUBIN TOTAL: 0.6 mg/dL (ref 0.0–1.2)
BUN / CREAT RATIO: 18
CALCIUM: 9.5 mg/dL (ref 8.5–10.2)
CHLORIDE: 102 mmol/L (ref 98–107)
CO2: 26 mmol/L (ref 22.0–30.0)
CREATININE: 0.95 mg/dL (ref 0.70–1.30)
EGFR MDRD AF AMER: >=60 mL/min/{1.73_m2} (ref >=60)
EGFR MDRD NON AF AMER: >=60 mL/min/{1.73_m2} (ref >=60)
GLUCOSE RANDOM: 94 mg/dL (ref 65–179)
POTASSIUM: 3.8 mmol/L (ref 3.5–5.0)
PROTEIN TOTAL: 7.6 g/dL (ref 6.5–8.3)
SODIUM: 141 mmol/L (ref 135–145)

## 2017-05-19 LAB — CO2: Carbon dioxide:SCnc:Pt:Ser/Plas:Qn:: 26

## 2017-05-19 LAB — RED CELL DISTRIBUTION WIDTH: Lab: 14.4

## 2017-05-19 LAB — PROSTATE SPECIFIC ANTIGEN: Prostate specific Ag:MCnc:Pt:Ser/Plas:Qn:: 34.7 — ABNORMAL HIGH

## 2017-05-19 NOTE — Unmapped (Signed)
Lab drawn and sent for analysis, care provided by A Fenton Malling RN

## 2017-05-19 NOTE — Unmapped (Signed)
Great seeing you in clinic today.    Dorna Leitz, MD    For appointments & questions Monday through Friday 8 AM??? 5 PM   Please call [phone] or Toll free [phone].    On Nights, Weekends and Holidays  Call [phone] and ask for the oncologist on call.    Laury Deep is my Statistician. She can answer questions and connect you with me.  Please call [phone] or Toll free [phone].  [email or web address]    [facility]  [address]  [place], [place] [address]  [email or web address]

## 2017-05-25 NOTE — Unmapped (Signed)
The patient returns for follow-up for Metastatic Prostate Cancer     DIAGNOSIS:   The patient was referred to Johney Maine, MD with a 15 lb weight loss, right hip pain with a right posterior acetabulum lytic lesion, and a PSA over 900. He says he was unable to obtain a biopsy because he is unable to pay an outstanding $800 bill. Therefore, he has not been treated. He presented to the [facility] ER on 09/09/2011 and again on 09/14/2011 asking to establish care with an oncologist.  His PSA upon presentation at [facility] was ~900. He was started on ADT with Lupron, which was changed to avodart and casodex d/t breast tenderness and decreased libido.    HISTORY OF PRESENT ILLNESS:     He is on Lupron and Aberaterone/prednisone for his metastatic prostate cancer. His PSA is slowly rising, so at some point we may need to switch to Taxotere.    He continues to feel well. His Neuroendocrine tumor grade 1 pancreatic mass will be observed.    ALLERGIES: No known drug allergies.   Meds: Updated in EPIC    Vitals:    05/19/17 1014   BP: 156/84   Pulse: 73   Resp: 16   Temp: 36.2 ??C (97.2 ??F)   SpO2: 97%     PHYSICAL EXAMINATION:   GENERAL: Pleasant, NAD.   BP 156/84  - Pulse 73  - Temp 36.2 ??C (97.2 ??F) (Oral)  - Resp 16  - Ht 167.6 cm (5' 5.98)  - Wt 66.3 kg (146 lb 3.2 oz)  - SpO2 97%  - BMI 23.61 kg/m??    This patient was examined by me during this clinic encounter and the exam was unchanged from previous visits.  MOUTH/THROAT: No oral lesions  LYMPHATICS: No adenopathy noted.  CARDIOVASCULAR: No murmurs of gallops. RRR  PULMONARY/CHEST: clear bilaterally.  ABDOMINAL:  Soft, nontender, nondistended.   PSYCHIATRIC: Alert and oriented.  Appropriate mood and affect.  NEUROLOGIC: nonfocal.    MEDICAL DECISION MAKING:     PSA   Date Value Ref Range Status   05/19/2017 34.70 (H) 0.00 - 4.00 ng/mL Final   02/10/2017 30.50 (H) 0.00 - 4.00 ng/mL Final   01/20/2017 19.60 (H) 0.00 - 4.00 ng/mL Final   01/07/2017 18.70 (H) 0.00 - 4.00 ng/mL Final   12/22/2016 19.40 (H) 0.00 - 4.00 ng/mL Final   12/03/2016 31.70 (H) 0.00 - 4.00 ng/mL Final   11/04/2016 30.30 (H) 0.00 - 4.00 ng/mL Final   08/06/2016 18.80 (H) 0.00 - 4.00 ng/mL Final   05/06/2016 24.10 (H) 0.00 - 4.00 ng/mL Final   01/29/2016 12.50 (H) 0.00 - 4.00 ng/mL Final   10/30/2015 11.10 (H) 0.00 - 4.00 ng/mL Final   07/24/2015 7.41 (H) 0.00 - 4.00 ng/mL Final   04/03/2015 3.44 0.00 - 4.00 ng/mL Final   12/23/2014 12.50 (H) 0.00 - 4.00 ng/mL Final         Lab Results   Component Value Date    PSADIAG 8.5 (H) 11/14/2014    PSADIAG 8.2 (H) 10/31/2014    PSADIAG 5.7 (H) 09/12/2014     PSA: 12.5  Lab Results   Component Value Date    TESTOSTERONE 11 (L) 05/19/2017         ASSESSMENT/PLAN: Clinically-diagnosed prostate cancer with bone lesions and a very elevated PSA. He has had a dramatic clinical and biochemical response to androgen deprivation therapy. The patient requested that we try a potency-sparing regimen, so he was started on  Avodart and Casodex, withholding Lupron.[name] He then has responded briskly to Lupron alone but now has a rising PSA after Casodex added. Abiraterone has been added.     --Lupron today, 22.5 mg IM given due again in 3 months  --RTC 3 months for PSA, visit, Lupron  --changed Casodex to abiraterone

## 2017-06-23 MED FILL — ZYTIGA/250MG/TAB: ZYTIGA/250MG/TAB | 30 days supply | Qty: 120 | Fill #7

## 2017-06-23 NOTE — Unmapped (Signed)
[facility] Refill Coordination Note  Specialty Medication(s): ZYTIGA    **PT REPORTS PREDNISONE WAS FILLED THIS MONTH BY ANOTHER PHARMACY**    Stephen Bartlett, DOB: 1941-03-25  Phone: [phone] (home) , Alternate phone contact: N/A  Phone or address changes today?: No  All above HIPAA information was verified with patient.  Shipping Address: [address]  [place] [address]   Insurance changes? No    Completed refill call assessment today to schedule patient's medication shipment from the [facility] [phone]).      Confirmed the medication and dosage are correct and have not changed: Yes, regimen is correct and unchanged.    Confirmed patient started or stopped the following medications in the past month:  No, there are no changes reported at this time.    Are you tolerating your medication?:  Stephen Bartlett reports tolerating the medication.    ADHERENCE      Did you miss any doses in the past 4 weeks? No missed doses reported.    FINANCIAL/SHIPPING    Delivery Scheduled: Yes, Expected medication delivery date: 06/24/17     Stephen Bartlett did not have any additional questions at this time.    Delivery address validated in [facility] scheduling system: Yes, address listed in [facility] is correct.    We will follow up with patient monthly for standard refill processing and delivery.      Thank you,  Westley Gambles   [facility] Specialty Technician

## 2017-07-05 MED ORDER — ALLOPURINOL 100 MG TABLET
ORAL_TABLET | Freq: Every day | ORAL | 3 refills | 0 days | Status: CP
Start: 2017-07-05 — End: 2017-07-05

## 2017-07-08 MED ORDER — ALLOPURINOL 100 MG TABLET
ORAL_TABLET | 3 refills | 0 days | Status: CP
Start: 2017-07-08 — End: 2018-06-16

## 2017-07-19 MED FILL — ZYTIGA/250MG/TAB: ZYTIGA/250MG/TAB | 30 days supply | Qty: 120 | Fill #8

## 2017-08-05 ENCOUNTER — Inpatient Hospital Stay
Admission: EM | Admit: 2017-08-05 | Discharge: 2017-08-12 | Disposition: A | Discharge status: Skilled Nursing Facility | Payer: MEDICARE | Source: Intra-hospital

## 2017-08-05 ENCOUNTER — Inpatient Hospital Stay
Admission: EM | Admit: 2017-08-05 | Discharge: 2017-08-12 | Disposition: A | Discharge status: Skilled Nursing Facility | Payer: MEDICARE | Source: Intra-hospital | Attending: Anesthesiology | Admitting: Anesthesiology

## 2017-08-05 DIAGNOSIS — G9519 Other vascular myelopathies: Principal | ICD-10-CM

## 2017-08-05 LAB — URINALYSIS
BILIRUBIN UA: NEGATIVE
BLOOD UA: NEGATIVE
KETONES UA: NEGATIVE
LEUKOCYTE ESTERASE UA: NEGATIVE
NITRITE UA: NEGATIVE
PH UA: 6 (ref 5.0–9.0)
PROTEIN UA: NEGATIVE
RBC UA: <1 /HPF (ref <3)
SPECIFIC GRAVITY UA: 1.025 (ref 1.005–1.040)
SQUAMOUS EPITHELIAL: 1 /HPF (ref 0–5)
UROBILINOGEN UA: 1
WBC UA: 10 /HPF — ABNORMAL HIGH (ref <2)

## 2017-08-05 LAB — COMPREHENSIVE METABOLIC PANEL
ALBUMIN: 4.4 g/dL (ref 3.5–5.0)
ALKALINE PHOSPHATASE: 87 U/L (ref 38–126)
ALT (SGPT): 25 U/L (ref 19–72)
ANION GAP: 8 mmol/L — ABNORMAL LOW (ref 9–15)
AST (SGOT): 34 U/L (ref 19–55)
BILIRUBIN TOTAL: 0.8 mg/dL (ref 0.0–1.2)
BLOOD UREA NITROGEN: 18 mg/dL (ref 7–21)
BUN / CREAT RATIO: 18
CALCIUM: 10.1 mg/dL (ref 8.5–10.2)
CHLORIDE: 107 mmol/L (ref 98–107)
CREATININE: 1.01 mg/dL (ref 0.70–1.30)
EGFR MDRD AF AMER: >=60 mL/min/{1.73_m2} (ref >=60)
EGFR MDRD NON AF AMER: >=60 mL/min/{1.73_m2} (ref >=60)
GLUCOSE RANDOM: 91 mg/dL (ref 65–179)
POTASSIUM: 4.3 mmol/L (ref 3.5–5.0)
SODIUM: 144 mmol/L (ref 135–145)

## 2017-08-05 LAB — BASIC METABOLIC PANEL
ANION GAP: 12 mmol/L (ref 9–15)
BLOOD UREA NITROGEN: 15 mg/dL (ref 7–21)
CALCIUM: 10 mg/dL (ref 8.5–10.2)
CHLORIDE: 104 mmol/L (ref 98–107)
CO2: 27 mmol/L (ref 22.0–30.0)
CREATININE: 0.98 mg/dL (ref 0.70–1.30)
EGFR MDRD AF AMER: >=60 mL/min/{1.73_m2} (ref >=60)
EGFR MDRD NON AF AMER: >=60 mL/min/{1.73_m2} (ref >=60)
GLUCOSE RANDOM: 110 mg/dL (ref 65–179)
POTASSIUM: 4.4 mmol/L (ref 3.5–5.0)
SODIUM: 143 mmol/L (ref 135–145)

## 2017-08-05 LAB — CBC W/ AUTO DIFF
BASOPHILS ABSOLUTE COUNT: 0 10*9/L (ref 0.0–0.1)
EOSINOPHILS ABSOLUTE COUNT: 0.1 10*9/L (ref 0.0–0.4)
HEMATOCRIT: 39.7 % — ABNORMAL LOW (ref 41.0–53.0)
HEMOGLOBIN: 13.3 g/dL — ABNORMAL LOW (ref 13.5–17.5)
LARGE UNSTAINED CELLS: 2 % (ref 0–4)
LYMPHOCYTES ABSOLUTE COUNT: 1.9 10*9/L (ref 1.5–5.0)
MEAN CORPUSCULAR HEMOGLOBIN CONC: 33.4 g/dL (ref 31.0–37.0)
MEAN CORPUSCULAR HEMOGLOBIN: 31.3 pg (ref 26.0–34.0)
MEAN CORPUSCULAR VOLUME: 93.6 fL (ref 80.0–100.0)
MEAN PLATELET VOLUME: 7.9 fL (ref 7.0–10.0)
NEUTROPHILS ABSOLUTE COUNT: 3.7 10*9/L (ref 2.0–7.5)
PLATELET COUNT: 214 10*9/L (ref 150–440)
RED CELL DISTRIBUTION WIDTH: 14.5 % (ref 12.0–15.0)
WBC ADJUSTED: 6.4 10*9/L (ref 4.5–11.0)

## 2017-08-05 LAB — LIPASE: Triacylglycerol lipase:CCnc:Pt:Ser/Plas:Qn:: 190

## 2017-08-05 LAB — CBC
HEMATOCRIT: 39.8 % — ABNORMAL LOW (ref 41.0–53.0)
HEMOGLOBIN: 13.3 g/dL — ABNORMAL LOW (ref 13.5–17.5)
MEAN CORPUSCULAR HEMOGLOBIN CONC: 33.3 g/dL (ref 31.0–37.0)
MEAN CORPUSCULAR HEMOGLOBIN: 31.1 pg (ref 26.0–34.0)
MEAN PLATELET VOLUME: 7.4 fL (ref 7.0–10.0)
PLATELET COUNT: 234 10*9/L (ref 150–440)
RED BLOOD CELL COUNT: 4.27 10*12/L — ABNORMAL LOW (ref 4.50–5.90)
RED CELL DISTRIBUTION WIDTH: 13.8 % (ref 12.0–15.0)
WBC ADJUSTED: 8.7 10*9/L (ref 4.5–11.0)

## 2017-08-05 LAB — EGFR MDRD AF AMER: Glomerular filtration rate/1.73 sq M.predicted.black:ArVRat:Pt:Ser/Plas/Bld:Qn:Creatinine-based formula (MDRD): >=60

## 2017-08-05 LAB — MEAN PLATELET VOLUME
Lab: 7.4
Lab: 7.9

## 2017-08-05 LAB — TROPONIN I: Troponin I.cardiac:MCnc:Pt:Ser/Plas:Qn:: <0.06

## 2017-08-05 LAB — CO2: Carbon dioxide:SCnc:Pt:Ser/Plas:Qn:: 27

## 2017-08-05 LAB — UROBILINOGEN UA: Lab: 1

## 2017-08-05 NOTE — Unmapped (Addendum)
I entered the patient's room to complete care rounds. Pt explained to me that he just received bad news. I sat down and asked the patient what he meant. [name] explained he has had worsening cancer. I provided the patient with kleenex and we discussed his plan of care and what to expect during a transfer. I asked if he would like me to call anyone for him. He asked me to call [phone] Selena Lesser [phone]. I attempted to call these numbers without an answer. Updated pt's nurse Jearld Shines about patient's desire to reach family/friends. I offered the patient food and drink, but he declined. [name] denied any pain currently, explained his only sx's have been weakness and falls.

## 2017-08-05 NOTE — Unmapped (Signed)
Rounding completed.  Pt resting in stretcher.  A/o x 4. Breathing appears unlabored.  Plan of care discussed and questions answered.  Call bell at reach.  Stretcher in lower position.   Awaiting MRI.

## 2017-08-05 NOTE — Unmapped (Signed)
Nurse in to speak with pt. Pt is crying some at this time. Nurse let pt speak with her about his situation. Pt was encouraged by nurse and nurse informed pt that Raynelle Fanning, RN was reaching out to his family per his request. Pt is still denying pain at this time. Nurse explained that if pt needed anything to call her.

## 2017-08-05 NOTE — Unmapped (Signed)
Patient returned from CT Scan  Transported by Radiology  How tranported Stretcher  Cardiac Monitor no

## 2017-08-05 NOTE — Unmapped (Signed)
[facility]  Emergency Department Provider Note          ED Clinical Impression     Final diagnoses:   Cord compression (CMS-HCC) (Primary)       ED Assessment/ Plan and Course     76 y.o. male with history of HTN, pancreatitis, bronchiectasis, kidney stones, metastatic prostate cancer on ADT, and neuroendocrine tumor who presents to the ED for evaluation of multiple falls in the last 2 days.  He does have a fair amount of strength within lying in bed noted to be 4 out of 5 strength with hip flexion and extension.  He has decreased sensation bilateral lower extremities.  He has decreased reflexes.  He does denies any thoracic or lumbar pain.  He denies any history of metastases known to the spine.  He does not have any cerebellar symptoms.  His neurologic exam is reassuring with normal cerebellar exam.  I do not think that this is vertigo or posterior cerebral insufficiency as the etiology of his symptoms.  Will need further imaging of his spine, CBC, CMP, UA, lipase, troponin, EKG, CXR, CT head.     12:30 PM   CBC, CMP, and lipase unremarkable. Troponin wnl. Lumbar spine shows evidence of Paget's of the right hemipelvis, no clear fracture or metastasis.  Given his inability to walk with his cancer history, we will order MRI CTL.     2:49 PM   MRI CTL shows metastasis involving the T3-5 vertebral bodies with epidural extension causing severe spinal canal narrowing and compression of the spinal cord. Will admit to main campus [facility], will need to EDD transfer for spine surgery.  I discussed the case with orthopedics, who confirms that they are on-call for spine surgery.  They are in agreement with transfer to main ED.  We have contacted the transfer center and awaiting callback.    Case discussed with Dr. Emeterio Reeve, resident on-call for spine surgery, per his discussion with his attending, patient will need surgery (posterior thoracic decompression and fusion).  However prior to surgery, he will need a medicine evaluation for medical optimization.  They have also discussed the case with VIR for possible embolization/assessment of feeder vessels to the tumor.  They have requested upon patient's arrival to the [facility] ED to consult in addition to orthopedic surgery, medicine as well as to notify [facility] guarding patient's arrival.  I discussed the case with Dr. Micael Hampshire at [facility] who is except patient has ED to ED transfer.  We have given him 10 mg of dexamethasone here prior to transport.    ____________________________________________    Time seen: August 05, 2017 9:48 AM    I have reviewed the triage vital signs and the nursing notes.     History     Chief Complaint  Multiple Falls      HPI   Stephen Bartlett is a 76 y.o. male with history of HTN, pancreatitis, bronchiectasis, kidney stones, metastatic prostate cancer on ADT, neuroendocrine tumor, and right posterior acetabulum lytic lesion who presents to the ED for evaluation of multiple falls in the last 2 days. Patient reports every time he stands, his bilateral legs give out causing him to fall.  States he feels cold chills going down his bilateral legs with every fall. Reports a hit to head yesterday with no LOC. Prior to onset of symptoms, he was able to ambulate normally and without assistance and now is having to walk with a cane. Denies any headaches, vision  changes, or slurred speech. No BLE numbness/tingling, groin numbness, changes in chronic urinary hesitancy, back pain, dizziness, urinary incontinence, fevers, chills, abdominal pain, chest pain, shortness of breath, hip pain, or any other symptoms at this time. Denies any pain. He does not some constipation, with last BM 3 days ago.       Past Medical History:   Diagnosis Date   ??? Calculus of kidney    ??? Hypertension    ??? Malignant neoplasm of prostate (CMS-HCC)        Patient Active Problem List   Diagnosis   ??? Malignant neoplasm of prostate (CMS-HCC)   ??? 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 Hypertension ??? Calculus of kidney   ??? Acute renal failure (CMS-HCC)   ??? Foreign body in bladder and urethra   ??? Uric acid nephrolithiasis   ??? Renal cyst, acquired, left   ??? Pancreatic mass   ??? Acute pancreatitis   ??? Bronchiectasis (CMS-HCC)   ??? Acute kidney injury (CMS-HCC)   ??? Lesion of pancreas       Past Surgical History:   Procedure Laterality Date   ??? PR UPGI ENDOSCOPY,FN NEEDLE BX,GUIDED N/A 02/11/2017    Procedure: UGI W/TRANSENDOSCOPIC US-GUIDE INTRA/TRANSMURAL NEEDLE ASP/BX (INCL EXAM ESOPHAGUS, STOMACH, DOUDENUM/JEJ);  Surgeon: Vonda Antigua, MD;  [facility];  Service: Gastroenterology   ??? URETEROSCOPY         No current facility-administered medications for this encounter.     Current Outpatient Prescriptions:   ???  abiraterone (ZYTIGA) 250 mg Tab tablet, Take 4 tablets (1,000 mg total) by mouth daily., Disp: 120 tablet, Rfl: 11  ???  allopurinol (ZYLOPRIM) 100 MG tablet, TAKE 1 TABLET EVERY DAY, Disp: 90 tablet, Rfl: 3  ???  amLODIPine (NORVASC) 5 MG tablet, Take 5 mg by mouth daily. , Disp: , Rfl:   ???  atorvastatin (LIPITOR) 10 MG tablet, Take 10 mg by mouth nightly. , Disp: , Rfl:   ???  bicalutamide (CASODEX) 50 MG tablet, TAKE 1 TABLET EVERY DAY, Disp: 90 tablet, Rfl: 4  ???  predniSONE (DELTASONE) 5 MG tablet, Take 1 tablet (5 mg total) by mouth Two (2) times a day., Disp: 60 tablet, Rfl: 11  ???  tamsulosin (FLOMAX) 0.4 mg capsule, TAKE 1 CAPSULE EVERY DAY, Disp: 90 capsule, Rfl: 3    Allergies  Patient has no known allergies.    Family History   Problem Relation Age of Onset   ??? Hypertension Mother    ??? Stroke Mother    ??? Prostate cancer Other    ??? GU problems Neg Hx    ??? Kidney cancer Neg Hx    ??? Urolithiasis Neg Hx        Social History  Social History   Substance Use Topics   ??? Smoking status: Never Smoker   ??? Smokeless tobacco: Never Used   ??? Alcohol use 0.6 oz/week     1 Cans of beer per week      Comment: now and then       Review of Systems    ROS:   GEN: no fevers, no chills OPHTHO: no visual changes, no drainage   ENT: no headache, no rhinorrhea, no neck pain, no dysphagia   CV: no chest pain, no palpitations   PULM: no shortness of breath, no orthopnea, no PND   ABD: positive for constipation, no abdominal pain, no n/v/d, no flank pain   GU: no dysuria, no hematuria, no  discharge   EXT: no edema, no myalgias   ID: no hx of immunodeficiency/ immunosuppression   HEME: no bleeding, no easy bruising   NEURO: positive for BLE weakness, no parethesias,   SKIN: no rash, no erythema, no abrasions   PSYCH: no hallucinations, no depression, no HI/SI        Physical Exam     P/E   Vitals:    08/05/17 0939   BP: 140/79   Pulse: 86   Resp: 18   Temp: 36.3 ??C (97.3 ??F)   TempSrc: Oral   SpO2: 98%     GEN: alert, awake, answers sentences coherently   OPHTHO: EOMI B, PERRLA B, no drainage, nml conjunctiva, VFFTC, no APD, no jaundice   HEENT: NCAT, OP wnl, no lesions, MMM   NECK: neck supple, FROM, no cspine tenderness, no LAD, no bruit   CV: RRR, no m/r/g   PULM: CTAB, no crackles, no wheezes, no rhonchi   ABD: nondistended, + BS, no tenderness, no vol/invol guarding, no rebound  BACK: no flank tenderness, no TLS tenderness   EXT: no edema, no cyanosis, no clubbing, FROM all extremities   NEURO: CN 2-12 intact - symmetric facial expression, no facial droop noted, no dysmetria, no pronator drit, unsteady gait, unable to take one step. 5/5 strength BUE, 4/5 strength BLE, no decreased sensation to light touch of LUE and RUE. noted decreased sensation to light touch of LLE and RLE. No decreased sensation to B torso, thorax.   SKIN: no rash, no erythema   PSCYH: no hallucinations, no HI/SI, normal affect     EKG     NSR, no sT elevation/depression    Radiology     Ct Head Wo Contrast  No acute intracranial finding.    Xr Chest 2 Views  Bronchial wall thickening and nodular airspace opacity seen right lower lobe, compatible with bronchiectasis and mucoid impacted airways noted on the prior CT dated 02/17/2017. No new consolidation.    Xr Lumbar Spine Ap, Lateral, [place]  Findings compatible with Paget's of the right hemipelvis. Advanced multilevel degenerative disc disease with degenerative listhesis.    Mri Cervical Thoracic Lumbar Spine W Wo Contrast  -Metastasis involving the T3, T4, and T5 vertebral bodies with epidural extension at the T3-T4 levels causing severe spinal canal narrowing and compression of the spinal cord with resulting edema within the cord. -Severe narrowing of the bilateral T3-4 and T4-5 neural foramina related to the tumor.      Pertinent labs & imaging results that were available during my care of the patient were reviewed by me and considered in my medical decision making (see chart for details).    Documentation assistance was provided by Caroll Rancher, Scribe, on August 05, 2017 at 9:48 AM for Joaquim Lai, MD.        A scribe was used when documenting this visit. I agree with the above documentation. Signed by  Correna Meacham Y Quavion Boule on  August 05, 2017 at 4:07 PM           Baeleigh Devincent Faythe Ghee, MD  08/05/17 [phone]

## 2017-08-05 NOTE — Unmapped (Signed)
Rounding completed.  Pt resting in stretcher.  A/o x 4. Breathing appears unlabored.  Plan of care discussed and questions answered.  Call bell at reach.  Stretcher in lower position.

## 2017-08-05 NOTE — Unmapped (Signed)
Patient transported to MRI  Transported by Radiology  How tranported Stretcher  Cardiac Monitor no

## 2017-08-05 NOTE — Unmapped (Signed)
Pt states he has had several falls over the past 2 days, states his legs give way. Also been constipated for the past 2 days asa well. Denies fever, chills, nausea, vomiting, chest pain or SOB.

## 2017-08-05 NOTE — Unmapped (Signed)
Pt given permission to take his home medication as per Dr. Debarah Crape order.

## 2017-08-05 NOTE — Unmapped (Signed)
Patient transported to X-ray  Transported by Radiology  How tranported Stretcher  Cardiac Monitor no

## 2017-08-05 NOTE — Unmapped (Signed)
Report given to Alivia RN 

## 2017-08-05 NOTE — Unmapped (Signed)
Patient returned from MRI  Transported by Radiology  How tranported Stretcher  Cardiac Monitor no

## 2017-08-05 NOTE — Unmapped (Signed)
Rounding completed.  Pt resting in stretcher.  A/o x 4. Breathing appears unlabored.  Plan of care discussed and questions answered.  Call bell at reach.  Stretcher in lower position.   Awaiting xrays.

## 2017-08-05 NOTE — Unmapped (Signed)
Patient returned from X-ray  Transported by Radiology  How tranported Stretcher  Cardiac Monitor no

## 2017-08-06 LAB — CBC
HEMATOCRIT: 30.7 % — ABNORMAL LOW (ref 41.0–53.0)
HEMOGLOBIN: 10.4 g/dL — ABNORMAL LOW (ref 13.5–17.5)
MEAN CORPUSCULAR HEMOGLOBIN: 30.9 pg (ref 26.0–34.0)
MEAN CORPUSCULAR VOLUME: 91.7 fL (ref 80.0–100.0)
MEAN PLATELET VOLUME: 7.6 fL (ref 7.0–10.0)
PLATELET COUNT: 180 10*9/L (ref 150–440)
RED BLOOD CELL COUNT: 3.35 10*12/L — ABNORMAL LOW (ref 4.50–5.90)
WBC ADJUSTED: 9.2 10*9/L (ref 4.5–11.0)

## 2017-08-06 LAB — BLOOD GAS CRITICAL CARE PANEL, ARTERIAL
BASE EXCESS ARTERIAL: -8.8 — ABNORMAL LOW (ref -2.0–2.0)
BASE EXCESS ARTERIAL: -7.5 — ABNORMAL LOW (ref -2.0–2.0)
CALCIUM IONIZED ARTERIAL (MG/DL): 3.99 mg/dL — ABNORMAL LOW (ref 4.40–5.40)
CALCIUM IONIZED ARTERIAL (MG/DL): 4.43 mg/dL (ref 4.40–5.40)
CALCIUM IONIZED ARTERIAL (MG/DL): 4.48 mg/dL (ref 4.40–5.40)
FIO2 ARTERIAL: 35
FIO2 ARTERIAL: 50
GLUCOSE WHOLE BLOOD: 122 mg/dL
GLUCOSE WHOLE BLOOD: 140 mg/dL
HCO3 ARTERIAL: 16 mmol/L — ABNORMAL LOW (ref 22–27)
HCO3 ARTERIAL: 18 mmol/L — ABNORMAL LOW (ref 22–27)
HEMOGLOBIN BLOOD GAS: 11.1 g/dL — ABNORMAL LOW (ref 13.50–17.50)
HEMOGLOBIN BLOOD GAS: 9.7 g/dL — ABNORMAL LOW (ref 13.50–17.50)
LACTATE BLOOD ARTERIAL: 0.9 mmol/L (ref <=1.2)
LACTATE BLOOD ARTERIAL: 1 mmol/L (ref <=1.2)
LACTATE BLOOD ARTERIAL: 1 mmol/L (ref <=1.2)
O2 SATURATION ARTERIAL: 99 % (ref 94.0–100.0)
O2 SATURATION ARTERIAL: 99 % (ref 94.0–100.0)
PCO2 ARTERIAL: 33.6 mmHg — ABNORMAL LOW (ref 35.0–45.0)
PCO2 ARTERIAL: 38.1 mmHg (ref 35.0–45.0)
PCO2 ARTERIAL: 44.7 mmHg (ref 35.0–45.0)
PH ARTERIAL: 7.28 — ABNORMAL LOW (ref 7.35–7.45)
PH ARTERIAL: 7.29 — ABNORMAL LOW (ref 7.35–7.45)
PO2 ARTERIAL: 148 mmHg — ABNORMAL HIGH (ref 80.0–110.0)
PO2 ARTERIAL: 166 mmHg — ABNORMAL HIGH (ref 80.0–110.0)
PO2 ARTERIAL: 213 mmHg — ABNORMAL HIGH (ref 80.0–110.0)
POTASSIUM WHOLE BLOOD: 3.1 mmol/L — ABNORMAL LOW (ref 3.4–4.6)
POTASSIUM WHOLE BLOOD: 3.5 mmol/L (ref 3.4–4.6)
POTASSIUM WHOLE BLOOD: 3.8 mmol/L (ref 3.4–4.6)
SODIUM WHOLE BLOOD: 141 mmol/L (ref 135–145)
SODIUM WHOLE BLOOD: 144 mmol/L (ref 135–145)

## 2017-08-06 LAB — BASIC METABOLIC PANEL
ANION GAP: 15 mmol/L (ref 9–15)
BLOOD UREA NITROGEN: 15 mg/dL (ref 7–21)
BUN / CREAT RATIO: 19
CALCIUM: 8.6 mg/dL (ref 8.5–10.2)
CHLORIDE: 108 mmol/L — ABNORMAL HIGH (ref 98–107)
CO2: 20 mmol/L — ABNORMAL LOW (ref 22.0–30.0)
CREATININE: 0.79 mg/dL (ref 0.70–1.30)
EGFR MDRD AF AMER: >=60 mL/min/{1.73_m2} (ref >=60)
EGFR MDRD NON AF AMER: >=60 mL/min/{1.73_m2} (ref >=60)
GLUCOSE RANDOM: 171 mg/dL (ref 65–179)
SODIUM: 143 mmol/L (ref 135–145)

## 2017-08-06 LAB — PHOSPHORUS: Phosphate:MCnc:Pt:Ser/Plas:Qn:: 4.3

## 2017-08-06 LAB — MEAN CORPUSCULAR HEMOGLOBIN: Lab: 30.9

## 2017-08-06 LAB — EGFR MDRD AF AMER: Glomerular filtration rate/1.73 sq M.predicted.black:ArVRat:Pt:Ser/Plas/Bld:Qn:Creatinine-based formula (MDRD): >=60

## 2017-08-06 LAB — HCO3 ARTERIAL: Bicarbonate:SCnc:Pt:BldA:Qn:: 18 — ABNORMAL LOW

## 2017-08-06 LAB — FIO2 ARTERIAL: Blood gas studies:Cmplx:-:^Patient:Set:: 35

## 2017-08-06 LAB — PO2 ARTERIAL: Oxygen:PPres:Pt:BldA:Qn:: 213 — ABNORMAL HIGH

## 2017-08-06 LAB — MAGNESIUM: Magnesium:MCnc:Pt:Ser/Plas:Qn:: 1.6

## 2017-08-06 NOTE — Unmapped (Signed)
Patients belongings placed in bags, sent with patients. Glasses placed in tissue box per patient request and sent with patient

## 2017-08-06 NOTE — Unmapped (Signed)
Surgical ICU Progress Note    Assessment/Plan:  Stephen Bartlett is a 76 year old male with known metastatic prostate cancer presents today with new compressive lesion in the T3-T4 interspace with symptomatic spinal cord compression.  He is now status post spinal decompression and fusion with orthopedics on 12/15      Neuro: awake, oriented, appropriate.   *Pain control  -Dilaudid PCA  -Oxycodone as needed  -Scheduled Tylenol  ??  *Spinal cord compression status post decompression and fusion 12/15  -Drains to remain in place until minimal output  - every 2 hours neurochecks.  -Continue Decadron   ??  CV: HDS.  ??  *History of hypertension  -Holding home amlodipine  -Map goal 85-100 for spinal perfusion  -Phenylephrine as needed  ??  Pulm: Satting well on room air.   -Pulmonary toilet  ??  FEN/GI:  F: IVF.  LR at 75 cc/h  E: replete electrolytes PRN  N: CLD, ADAT  - bowel regimen  ??  Renal/GU:   - Cr wnl. Adequate UOP.  - Foley for accurate I/Os in critically ill patient  ??  *Hematuria and history of prostate cancer  - Urology consult in AM: NTD per consult resident  - Will continue foley in setting of aggressive MAP goals  ??  Heme: H/H stable.   - PSA, free testosterone sent per heme/onc recs   ??  ID: Afebrile.   - Keflex for prophylaxis per SRO  ??  Endo: No active issues.  ??  Lines, tubes, drains: PIV, foley  ??  Dispo: Admit to SICU.     Discussed with Dr. Celso Amy.    Subjective:   NAEON. Pain well controlled. No c/o numbness/tingling or weakness BLE.    Objective:  Temp:  [35.7 ??C-36.8 ??C] 36.8 ??C  Heart Rate:  [70-96] 90  SpO2 Pulse:  [90-95] 90  Resp:  [13-19] 14  BP: (128-168)/(79-96) 140/96  A BP-1: (133-157)/(64-72) 148/64  SpO2:  [98 %-100 %] 100 %        Gen: NAD, awake, alert, oriented  CV: RRR  Pulm: CTAB, no increased WOB  Abd: Soft, ND, NT  GU: Foley in place, hematuria in foley catheter and bag  MSK: Motor and sensation intact BLE, equal bilaterally      Medications (scheduled)  ??? acetaminophen 650 mg Oral Q6H   ??? ceFAZolin (ANCEF) IV  2 g Intravenous Q8H   ??? dexamethasone  10 mg Intravenous Q6H   ??? docusate sodium  100 mg Oral Daily   ??? polyethylene glycol  17 g Oral Daily       Medications (prn)  naloxone, ondansetron, oxyCODONE, phenylephrine HCl in 0.9% NaCl    Labs:   Recent Results (from the past 24 hour(s))   ECG 12 Lead    Collection Time: 08/05/17 10:25 AM   Result Value Ref Range    EKG Systolic BP  mmHg    EKG Diastolic BP  mmHg    EKG Ventricular Rate 71 BPM    EKG Atrial Rate 71 BPM    EKG P-R Interval 176 ms    EKG QRS Duration 86 ms    EKG Q-T Interval 410 ms    EKG QTC Calculation 445 ms    EKG Calculated P Axis 64 degrees    EKG Calculated R Axis -13 degrees    EKG Calculated T Axis 75 degrees   Comprehensive metabolic panel    Collection Time: 08/05/17 10:33 AM   Result Value Ref Range  Sodium 144 135 - 145 mmol/L    Potassium 4.3 3.5 - 5.0 mmol/L    Chloride 107 98 - 107 mmol/L    CO2 29.0 22.0 - 30.0 mmol/L    BUN 18 7 - 21 mg/dL    Creatinine 0.98 1.19 - 1.30 mg/dL    BUN/Creatinine Ratio 18     EGFR MDRD Non Af Amer >=60 >=60 mL/min/1.33m2    EGFR MDRD Af Amer >=60 >=60 mL/min/1.3m2    Anion Gap 8 (L) 9 - 15 mmol/L    Glucose 91 65 - 179 mg/dL    Calcium 14.7 8.5 - 82.9 mg/dL    Albumin 4.4 3.5 - 5.0 g/dL    Total Protein 7.7 6.5 - 8.3 g/dL    Total Bilirubin 0.8 0.0 - 1.2 mg/dL    AST 34 19 - 55 U/L    ALT 25 19 - 72 U/L    Alkaline Phosphatase 87 38 - 126 U/L   Troponin I    Collection Time: 08/05/17 10:33 AM   Result Value Ref Range    Troponin I <0.060 <0.060 ng/mL   Lipase    Collection Time: 08/05/17 10:33 AM   Result Value Ref Range    Lipase 190 44 - 232 U/L   CBC w/ Differential    Collection Time: 08/05/17 10:33 AM   Result Value Ref Range    WBC 6.4 4.5 - 11.0 10*9/L    RBC 4.24 (L) 4.50 - 5.90 10*12/L    HGB 13.3 (L) 13.5 - 17.5 g/dL    HCT 56.2 (L) 13.0 - 53.0 %    MCV 93.6 80.0 - 100.0 fL    MCH 31.3 26.0 - 34.0 pg    MCHC 33.4 31.0 - 37.0 g/dL    RDW 86.5 78.4 - 69.6 %    MPV 7.9 7.0 - 10.0 fL    Platelet 214 150 - 440 10*9/L    Absolute Neutrophils 3.7 2.0 - 7.5 10*9/L    Absolute Lymphocytes 1.9 1.5 - 5.0 10*9/L    Absolute Monocytes 0.5 0.2 - 0.8 10*9/L    Absolute Eosinophils 0.1 0.0 - 0.4 10*9/L    Absolute Basophils 0.0 0.0 - 0.1 10*9/L    Large Unstained Cells 2 0 - 4 %   Urinalysis    Collection Time: 08/05/17  2:28 PM   Result Value Ref Range    Color, UA Yellow     Clarity, UA Cloudy     Specific Gravity, UA 1.025 1.005 - 1.040    pH, UA 6.0 5.0 - 9.0    Leukocyte Esterase, UA Negative Negative    Nitrite, UA Negative Negative    Protein, UA Negative Negative    Glucose, UA Negative Negative    Ketones, UA Negative Negative    Urobilinogen, UA 1.0 mg/dL 0.2 - 2.0 mg/dL    Bilirubin, UA Negative Negative    Blood, UA Negative Negative    RBC, UA <1 <3 /HPF    WBC, UA 10 (H) <2 /HPF    Squam Epithel, UA 1 0 - 5 /HPF    Bacteria, UA Many (A) None Seen /HPF   Type and Screen    Collection Time: 08/05/17  6:45 PM   Result Value Ref Range    Check Type Needed Type Check     ABO Grouping B POS     Antibody Screen NEG    Basic Metabolic Panel    Collection Time: 08/05/17  8:58 PM  Result Value Ref Range    Sodium 143 135 - 145 mmol/L    Potassium 4.4 3.5 - 5.0 mmol/L    Chloride 104 98 - 107 mmol/L    CO2 27.0 22.0 - 30.0 mmol/L    BUN 15 7 - 21 mg/dL    Creatinine 1.61 0.96 - 1.30 mg/dL    BUN/Creatinine Ratio 15     EGFR MDRD Non Af Amer >=60 >=60 mL/min/1.54m2    EGFR MDRD Af Amer >=60 >=60 mL/min/1.30m2    Anion Gap 12 9 - 15 mmol/L    Glucose 110 65 - 179 mg/dL    Calcium 04.5 8.5 - 40.9 mg/dL   CBC    Collection Time: 08/05/17  8:58 PM   Result Value Ref Range    WBC 8.7 4.5 - 11.0 10*9/L    RBC 4.27 (L) 4.50 - 5.90 10*12/L    HGB 13.3 (L) 13.5 - 17.5 g/dL    HCT 81.1 (L) 91.4 - 53.0 %    MCV 93.2 80.0 - 100.0 fL    MCH 31.1 26.0 - 34.0 pg    MCHC 33.3 31.0 - 37.0 g/dL    RDW 78.2 95.6 - 21.3 %    MPV 7.4 7.0 - 10.0 fL    Platelet 234 150 - 440 10*9/L   Type and Screen    Collection Time: 08/05/17  9:33 PM   Result Value Ref Range    ABO Grouping B POS    Prepare Plasma    Collection Time: 08/05/17 11:37 PM   Result Value Ref Range    Unit Blood Type AB Pos     ISBT Number 8400     Unit # [id number]     Status Returned from Issue     Unit Blood Type AB Neg     ISBT Number 2800     Unit # [id number]     Status Returned from Issue     Unit Blood Type AB Pos     ISBT Number 8400     Unit # [id number]     Status Returned from Issue    ABGs w/LYTES    Collection Time: 08/06/17 12:02 AM   Result Value Ref Range    Specimen Source Arterial     FIO2 Arterial 50%     pH, Arterial 7.28 (L) 7.35 - 7.45    pCO2, Arterial 44.7 35.0 - 45.0 mm Hg    pO2, Arterial 213.0 (H) 80.0 - 110.0 mm Hg    HCO3 (Bicarbonate), Arterial 21 (L) 22 - 27 mmol/L    Base Excess, Arterial -5.0 (L) -2.0 - 2.0    O2 Sat, Arterial 99.0 94.0 - 100.0 %    Sodium Whole Blood 141 135 - 145 mmol/L    Potassium, Bld 3.8 3.4 - 4.6 mmol/L    Calcium, Ionized Arterial 4.48 4.40 - 5.40 mg/dL    Glucose Whole Blood 141 Undefined mg/dL    Lactate, Arterial 1.0 <=1.2 mmol/L    Hgb, blood gas 11.10 (L) 13.50 - 17.50 g/dL   ABGs w/LYTES    Collection Time: 08/06/17  1:21 AM   Result Value Ref Range    Specimen Source Arterial     FIO2 Arterial 35%     pH, Arterial 7.31 (L) 7.35 - 7.45    pCO2, Arterial 33.6 (L) 35.0 - 45.0 mm Hg    pO2, Arterial 166.0 (H) 80.0 - 110.0 mm Hg    HCO3 (Bicarbonate), Arterial 16 (L) 22 -  27 mmol/L    Base Excess, Arterial -8.8 (L) -2.0 - 2.0    O2 Sat, Arterial 99.0 94.0 - 100.0 %    Sodium Whole Blood 144 135 - 145 mmol/L    Potassium, Bld 3.1 (L) 3.4 - 4.6 mmol/L    Calcium, Ionized Arterial 3.99 (L) 4.40 - 5.40 mg/dL    Glucose Whole Blood 122 Undefined mg/dL    Lactate, Arterial 0.9 <=1.2 mmol/L    Hgb, blood gas 8.80 (L) 13.50 - 17.50 g/dL   ABGs w/LYTES    Collection Time: 08/06/17  2:24 AM   Result Value Ref Range    Specimen Source Arterial     FIO2 Arterial 24%     pH, Arterial 7.29 (L) 7.35 - 7.45    pCO2, Arterial 38.1 35.0 - 45.0 mm Hg    pO2, Arterial 148.0 (H) 80.0 - 110.0 mm Hg    HCO3 (Bicarbonate), Arterial 18 (L) 22 - 27 mmol/L    Base Excess, Arterial -7.5 (L) -2.0 - 2.0    O2 Sat, Arterial 98.6 94.0 - 100.0 %    Sodium Whole Blood 142 135 - 145 mmol/L    Potassium, Bld 3.5 3.4 - 4.6 mmol/L    Calcium, Ionized Arterial 4.43 4.40 - 5.40 mg/dL    Glucose Whole Blood 140 Undefined mg/dL    Lactate, Arterial 1.0 <=1.2 mmol/L    Hgb, blood gas 9.70 (L) 13.50 - 17.50 g/dL   Prepare RBC    Collection Time: 08/06/17  4:36 AM   Result Value Ref Range    Crossmatch Compatible     Unit Blood Type B Pos     ISBT Number 7300     Unit # [id number]     Status Ready     Spec Expiration [id number]     Product ID Red Blood Cells     PRODUCT CODE [id number]     Crossmatch Compatible     Unit Blood Type B Pos     ISBT Number 7300     Unit # [id number]     Status Ready     Spec Expiration [id number]     Product ID Red Blood Cells     PRODUCT CODE [id number]     Crossmatch Compatible     Unit Blood Type B Pos     ISBT Number 7300     Unit # [id number]     Status Ready     Spec Expiration [id number]     Product ID Red Blood Cells     PRODUCT CODE [id number]     Crossmatch Compatible     Unit Blood Type B Pos     ISBT Number 7300     Unit # [id number]     Status Ready     Spec Expiration [id number]     Product ID Red Blood Cells     PRODUCT CODE [id number]    Prepare Plasma    Collection Time: 08/06/17  4:36 AM   Result Value Ref Range    Unit Blood Type B Pos     ISBT Number 7300     Unit # [id number]     Status Returned from Issue     Unit Blood Type B Pos     ISBT Number 7300     Unit # [id number]     Status Returned from Issue     Unit Blood Type B Neg     ISBT Number 1700     Unit #  [id number]     Status Returned from Issue     Unit Blood Type B Pos     ISBT Number 7300     Unit # [id number]     Status Returned from Issue    Basic Metabolic Panel    Collection Time: 08/06/17  5:04 AM   Result Value Ref Range    Sodium 143 135 - 145 mmol/L    Potassium 3.8 3.5 - 5.0 mmol/L    Chloride 108 (H) 98 - 107 mmol/L    CO2 20.0 (L) 22.0 - 30.0 mmol/L    BUN 15 7 - 21 mg/dL    Creatinine 3.08 6.57 - 1.30 mg/dL    BUN/Creatinine Ratio 19     EGFR MDRD Non Af Amer >=60 >=60 mL/min/1.44m2    EGFR MDRD Af Amer >=60 >=60 mL/min/1.64m2    Anion Gap 15 9 - 15 mmol/L    Glucose 171 65 - 179 mg/dL    Calcium 8.6 8.5 - 84.6 mg/dL   Magnesium Level    Collection Time: 08/06/17  5:04 AM   Result Value Ref Range    Magnesium 1.6 1.6 - 2.2 mg/dL   Phosphorus Level    Collection Time: 08/06/17  5:04 AM   Result Value Ref Range    Phosphorus 4.3 2.9 - 4.7 mg/dL   CBC    Collection Time: 08/06/17  5:04 AM   Result Value Ref Range    WBC 9.2 4.5 - 11.0 10*9/L    RBC 3.35 (L) 4.50 - 5.90 10*12/L    HGB 10.4 (L) 13.5 - 17.5 g/dL    HCT 96.2 (L) 95.2 - 53.0 %    MCV 91.7 80.0 - 100.0 fL    MCH 30.9 26.0 - 34.0 pg    MCHC 33.7 31.0 - 37.0 g/dL    RDW 84.1 32.4 - 40.1 %    MPV 7.6 7.0 - 10.0 fL    Platelet 180 150 - 440 10*9/L       Attending Attestation    I evaluated this patient and  Performed a  physical examination and discussed the patient's management with the Resident. I reviewed the Resident's note and agree with the documented findings and plan of care.    This patient was critically ill during my evaluation due to acidosis - metabolic, acute kidney injury, altered mental status, delerium, fracture - spine, hypotension, oliguria, respiratory insufficiency, SIRS and tachycardia.    My interventions included antibiotics, beta blockade, diuresis, family discussion, fluid management, management of delerium, management of sedation and pain control, management of electrolytes, non-invasive ventilation, peri-operative care and wound care,    My total critical care time, excluding procedures, was 60 minutes.    Burnett Corrente MD

## 2017-08-06 NOTE — Unmapped (Signed)
ORTHOPAEDIC CONSULT    Assessment and Plan  76 y.o. male with the following:    -New compressive lesion identified in the T3-4 interspace with concern for malignant neoplasm/metastases from narrowing prostate or neuroendocrine tumor:  ?? Consented for posterior cervical and thoracic decompression from T3-T5, posterior cervical and thoracic fusion from C5-T8  ?? Patient has intact lower extremity function but slightly weak per below.  Intact bowel and bladder function with volitional control rectum  ?? Please keep patient n.p.o. until after the operating room.  ?? Typed and crossed 4 units of packed red cells, platelets  - Weight Bearing Status/Activity: non-weightbearing.  - Recommended labs: Preoperative labs: CBC, Chemistry, Coags, Type and screen and Type and cross 4 units PRBCs.  - Pertinent medical history: none and Prostate and neuroendocrine tumor, know prostate mets since 2016  - Pain control: per ED/primary  - If applicable, patient advised to stop use of tobacco.  - Follow-up plan: we will call patient at [phone] (home)  to arrange follow-up.    - Primary Service for this Patient: Emergency Medicine.    Please contact the resident who leaves daily progress notes with questions or concerns. Case was discussed with on call chief resident/attending.        Procedure(s)  none     Subjective     History of Present Illness:   Chief Complaint: Back pain and gait instability     76 y.o. male  hx of Prostate cancer and neuroendocrine tumor, know prostate mets since 2016 who is presenting with an expansile mass at T3-T4 causing significant cord compression. Patient was in his usual state of health until Wednesday where he began falling frequently.  He states that he fell in the bathroom and had to be helped up and now has to use a cane that he borrowed from his neighbor.  He notes that he does not have vertigo or a prodrome before falls he just feels unstable on his feet. He has had worsening back pain over the last few days but he attributes this to his falls. He has had no bowel or bladder changes. He has a history of prostate cancer with mets and was aware he had a met in his back.    Patient denies any past medical history other than the cancer history delineated above and hypertension.  He is otherwise relatively healthy.  He worked in the [employer] his entire life.  His son is a Clinical research associate.    Pain is described as sharp, made worse with movement, and relieved with rest.  Pain does not radiate.  Denies other pain/injury. Denies new onset numbness or tingling. Denies fevers or chills.    Medical History:  Past Medical History:   Diagnosis Date   ??? Calculus of kidney    ??? Hypertension    ??? Malignant neoplasm of prostate (CMS-HCC)     Surgical History  Past Surgical History:   Procedure Laterality Date   ??? PR UPGI ENDOSCOPY,FN NEEDLE BX,GUIDED N/A 02/11/2017    Procedure: UGI W/TRANSENDOSCOPIC US-GUIDE INTRA/TRANSMURAL NEEDLE ASP/BX (INCL EXAM ESOPHAGUS, STOMACH, DOUDENUM/JEJ);  Surgeon: Vonda Antigua, MD;  [facility];  Service: Gastroenterology   ??? URETEROSCOPY        Medications:   Current Facility-Administered Medications   Medication Dose Route Frequency Provider Last Rate Last Dose   ??? dexamethasone (DECADRON) 4 mg/mL injection 10 mg  10 mg Intravenous Q6H Benson Setting, MD   10 mg at 08/05/17 1845  Current Outpatient Prescriptions   Medication Sig Dispense Refill   ??? abiraterone (ZYTIGA) 250 mg Tab tablet Take 4 tablets (1,000 mg total) by mouth daily. 120 tablet 11   ??? allopurinol (ZYLOPRIM) 100 MG tablet TAKE 1 TABLET EVERY DAY 90 tablet 3   ??? amLODIPine (NORVASC) 5 MG tablet Take 5 mg by mouth daily.      ??? atorvastatin (LIPITOR) 10 MG tablet Take 10 mg by mouth nightly.      ??? bicalutamide (CASODEX) 50 MG tablet TAKE 1 TABLET EVERY DAY 90 tablet 4   ??? predniSONE (DELTASONE) 5 MG tablet Take 1 tablet (5 mg total) by mouth Two (2) times a day. 60 tablet 11   ??? tamsulosin (FLOMAX) 0.4 mg capsule TAKE 1 CAPSULE EVERY DAY 90 capsule 3    Allergies:  Patient has no known allergies.   Social History:  Tobacco use: denies.  Alcohol use: denies.  Drug use: denies.  Employment: retired.  Prior ambulatory status: No limitations, community ambulator. Family History:  family history includes Hypertension in his mother; Prostate cancer in his other; Stroke in his mother..     Review of Systems 10 point ROS completed and negative except as stated above.     Objective  Physical Exam  Vitals: Blood pressure 140/96, pulse 80, temperature 36.7 ??C, temperature source Oral, resp. rate 16, SpO2 100 %.  General Appearance: No acute distress, laying in bed  Cardiovascular: Palpable radial pulses and DP pulses bilaterally.   Neuro/Compartments: Bilateral upper extremities: +motor in Axillary, AIN, PIN, Ulnar distributions. SILT in axillary, medial, radial, and ulnar nerve distributions. Compartments are soft and compressible. No pain with passive stretch of fingers.  Bilateral lower extremities: +motor in GS/TA/EHL. SILT in superficial/deep peroneal, saphenous, sural and tibial nerve distributions. Compartments are soft and compressible. No pain with passive stretch of toes.    Musculoskeletal:    Right Upper Extremity  Patient has intact light touch in median, ulnar, radial descriptions.  Patient is able to fire axillary, and, PIN, ulnar nerves.  Fingers are warm and well-perfused.  Compartments are soft and compressible, no pain with passive stretch.    Left upper Extremity  Patient has intact light touch in median, ulnar, radial descriptions.  Patient is able to fire axillary, and, PIN, ulnar nerves.  Fingers are warm and well-perfused.  Compartments are soft and compressible, no pain with passive stretch.    Right lower Extremity  Patient is intact to light touch in the DP/PT/SPN/S/S distributions.  Is able to fire TA, GS, EHL, FHL.  Toes are warm and well-perfused.  No pain with passive stretch. Compartments soft and compressible.    Left Lower Extremity  Patient is intact to light touch in the DP/PT/SPN/S/S distributions.  Is able to fire TA, GS, EHL, FHL.  Toes are warm and well-perfused.  No pain with passive stretch.  Compartments soft and compressible.      Test Results  Imaging: MRI of the cervical, thoracic, lumbar spines are doing T3-T4 vertebral body obliteration, severe spinal cord stenosis.    Labs:   All lab results last 24 hours:    Recent Results (from the past 24 hour(s))   ECG 12 Lead    Collection Time: 08/05/17 10:25 AM   Result Value Ref Range    EKG Systolic BP  mmHg    EKG Diastolic BP  mmHg    EKG Ventricular Rate 71 BPM    EKG Atrial Rate 71 BPM    EKG P-R  Interval 176 ms    EKG QRS Duration 86 ms    EKG Q-T Interval 410 ms    EKG QTC Calculation 445 ms    EKG Calculated P Axis 64 degrees    EKG Calculated R Axis -13 degrees    EKG Calculated T Axis 75 degrees   Comprehensive metabolic panel    Collection Time: 08/05/17 10:33 AM   Result Value Ref Range    Sodium 144 135 - 145 mmol/L    Potassium 4.3 3.5 - 5.0 mmol/L    Chloride 107 98 - 107 mmol/L    CO2 29.0 22.0 - 30.0 mmol/L    BUN 18 7 - 21 mg/dL    Creatinine 1.61 0.96 - 1.30 mg/dL    BUN/Creatinine Ratio 18     EGFR MDRD Non Af Amer >=60 >=60 mL/min/1.4m2    EGFR MDRD Af Amer >=60 >=60 mL/min/1.5m2    Anion Gap 8 (L) 9 - 15 mmol/L    Glucose 91 65 - 179 mg/dL    Calcium 04.5 8.5 - 40.9 mg/dL    Albumin 4.4 3.5 - 5.0 g/dL    Total Protein 7.7 6.5 - 8.3 g/dL    Total Bilirubin 0.8 0.0 - 1.2 mg/dL    AST 34 19 - 55 U/L    ALT 25 19 - 72 U/L    Alkaline Phosphatase 87 38 - 126 U/L   Troponin I    Collection Time: 08/05/17 10:33 AM   Result Value Ref Range    Troponin I <0.060 <0.060 ng/mL   Lipase    Collection Time: 08/05/17 10:33 AM   Result Value Ref Range    Lipase 190 44 - 232 U/L   CBC w/ Differential    Collection Time: 08/05/17 10:33 AM   Result Value Ref Range    WBC 6.4 4.5 - 11.0 10*9/L    RBC 4.24 (L) 4.50 - 5.90 10*12/L    HGB 13.3 (L) 13.5 - 17.5 g/dL    HCT 81.1 (L) 91.4 - 53.0 %    MCV 93.6 80.0 - 100.0 fL    MCH 31.3 26.0 - 34.0 pg    MCHC 33.4 31.0 - 37.0 g/dL    RDW 78.2 95.6 - 21.3 %    MPV 7.9 7.0 - 10.0 fL    Platelet 214 150 - 440 10*9/L    Absolute Neutrophils 3.7 2.0 - 7.5 10*9/L    Absolute Lymphocytes 1.9 1.5 - 5.0 10*9/L    Absolute Monocytes 0.5 0.2 - 0.8 10*9/L    Absolute Eosinophils 0.1 0.0 - 0.4 10*9/L    Absolute Basophils 0.0 0.0 - 0.1 10*9/L    Large Unstained Cells 2 0 - 4 %   Urinalysis    Collection Time: 08/05/17  2:28 PM   Result Value Ref Range    Color, UA Yellow     Clarity, UA Cloudy     Specific Gravity, UA 1.025 1.005 - 1.040    pH, UA 6.0 5.0 - 9.0    Leukocyte Esterase, UA Negative Negative    Nitrite, UA Negative Negative    Protein, UA Negative Negative    Glucose, UA Negative Negative    Ketones, UA Negative Negative    Urobilinogen, UA 1.0 mg/dL 0.2 - 2.0 mg/dL    Bilirubin, UA Negative Negative    Blood, UA Negative Negative    RBC, UA <1 <3 /HPF    WBC, UA 10 (H) <2 /HPF    Squam Epithel,  UA 1 0 - 5 /HPF    Bacteria, UA Many (A) None Seen /HPF   Type and Screen    Collection Time: 08/05/17  6:45 PM   Result Value Ref Range    Check Type Needed Type Check     ABO Grouping B POS     Antibody Screen NEG          Problem List  Active Problems:    * No active hospital problems. *      Note created by Rochele Raring, August 05, 2017 8:14 PM                         Motor R L  Reflexes R L   Deltoid 5 5  Tricep absent absent   Bicep 5 5  Bicep absent 1+   Tricep 5 5  Brachiorad absent inverted   WE 5 5       Grip 5 5  Patellar brisk brisk   IO 5 5  Achilles absent absent   IP 4 4       Quad 4 4  Pathologic R L   TA 4+ 4+  Hoffmann's neg. neg.   EHL 3 3  Babinski neg. neg.   GS 5 5  Clonus 1-2 beats 1-2 beats

## 2017-08-06 NOTE — Unmapped (Signed)
Ortho team at bedside

## 2017-08-06 NOTE — Unmapped (Signed)
Pt resting in bed. Bed lowered and locked, side rails up x 1, call bell within reach. Respirations even and unlabored. No signs of distress of discomfort noted, will continue to reassess.

## 2017-08-06 NOTE — Unmapped (Signed)
ORTHOPAEDIC SURGERY PROGRESS NOTE  Attending: Cameron Ali    s/p posterior cervical and thoracic decompression from T3-T5, posterior cervical and thoracic fusion from C7-T8     ASSESSMENT/PLAN:  Stephen Bartlett is a 76 y.o. male doing well postoperatively.     -Activities as tolerated  -Decadron Q6 hours for 48 hours  -MAP pushes for maps >55mmHg  -Ancef until drains are out   -Drains to bulb suction  -HOB as tolerated  -Q1 hour neuro checks  -Dressing: Keep intact, change POD2-3  -DVT Ppx: Will discuss. Please hold for now.     SUBJECTIVE:  Doing well post-operatively. Pain controlled.     OBJECTIVE:  PE:    Vitals:    08/06/17 0700   BP:    Pulse: 90   Resp: 14   Temp:    SpO2: 100%     Alert and oriented.   RLE:   4/5 TA, 4/5 EHL, 5-/5 GS, 4/5 quad, 4/5 IP  LLE:  4/5 TA, 5-/5 EHL, 5-/5 GS, 4/5 quad, 4/5 IP  SILT: L3-S1    * Please page Clement Sayres, NP [phone] with any questions while patient in inpatient. Please contact the resident who leaves daily progress notes for any questions between 3-5PM and then Page orthopaedic consult pager [phone]) on nights (after 5PM) and weekends    - Current orthopaedic contact resident: Warren Danes [phone]  - Current orthopaedic care attending: Cavanaugh  - On nights (6pm-6am), weekends, and holidays, please page Orthopaedic Consult pager.

## 2017-08-06 NOTE — Unmapped (Signed)
Bed: 09-A  Expected date:   Expected time:   Means of arrival:   Comments:  charge

## 2017-08-06 NOTE — Unmapped (Signed)
Transfer from [facility] for evaluation of multiple falls. Prostate CA, no chemo, mets to spine. EMS reports VS WNL.

## 2017-08-06 NOTE — Unmapped (Signed)
[facility]  Emergency Department Progress Note for Continuation of Care      August 05, 2017 6:02 PM    Stephen Bartlett is a 76 y.o. male who was transported to this emergency department from the [facility] Emergency Department.  I have reviewed the available documentation and test results.     Brief History and Assessment:     Briefly, this is a 76 y.o. male with a history of HTN, pancreatitis, bronchiectasis, kidney stones, metastatic prostate cancer on ADT, and neuroendocrine tumor who presented to the [facility] ED for bilateral leg weakness and multiple falls over the last 2 days. MRI showed metastasis involving the T3, T4, and T5 vertebral bodies with epidural extension at the T3-T4 levels causing severe spinal canal narrowing and compression of the spinal cord with resulting edema within the cord. Severe narrowing of the bilateral T3-4 and T4-5 neural foramina related to the tumor. Patient transferred for further evaluation. Given 10mg  of dexamethasone prior to transfer.      [facility] ED Course:     6:39 PM  Orthopedics has evaluated patient.  They plan to take patient to the OR later this evening for surgical decompression.  They recommend VIR consult to discuss potential embolization of tumor prior to surgery.     6:40 PM  Spoke with VIR.  They recommend neuro interventionalist consult for potential embolization. Orthopedics aware and have taken patient up to OR.      ED Clinical Impression     Final diagnoses:   None     Documentation assistance was provided by Jobe Marker, Scribe, on August 05, 2017 at 6:02 PM for Gevena Mart, MD.      August 05, 2017 7:22 PM. Documentation assistance provided by the scribe. I was present during the time the encounter was recorded. The information recorded by the scribe was done at my direction and has been reviewed and validated by me.        Gevena Mart, MD  Resident  08/05/17 2002

## 2017-08-06 NOTE — Unmapped (Addendum)
Pt transported to OR via transport in stretcher.

## 2017-08-06 NOTE — Unmapped (Signed)
Oncology Consult Note    Requesting Attending Physician :  Timothy Lasso, MD  Service Requesting Consult : Orthopedics [facility])    Assessment/Recommendations:    Metastatic Prostate Cancer with new compressive T3-T4 lesion s/p decompression and fusion: 76y/o male who has known diagnosis of metastatic prostate cancer (presenting PSA 900) since 2016, he has had slowly rising PSA in the outpatient setting since 01/2017 (19 ->34). His current management consists of Abiraterone/Prednisone and 3 monthly Lupron (last dose 05/19/17).  The patient states he has been compliant with his current therapy. 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 His primary oncologist has previously had discussion of changing therapy to Docetaxel, however this will now be deferred to the outpatient setting once he has recovered from his spinal fusion surgery.    Plan:  1) Please check a) total testosterone b) Serum PSA  2) May continue Abiraterone 1000mg  once daily and Prednisone 5mg  BID at present  3) We will liaise with his outpatient oncologist about     The patient was seen by and discussed with Dr, Max Fickle.    Benay Pike, MD, MS  Hematology and Oncology Fellow    TEACHING PHYSICIAN ATTESTATION    I was present with Dr. Lucianne Muss, fellow during the history and exam.  I discussed the findings, assessment and plan with him and agree with the findings and plan as documented in his note. Will eventually need XRT and further decisions regarding management of progressive metastatic CRPC with likely cessation of abiraterone and consideration of docetaxel chemotherapy.     Barbie Haggis, MD  Attending Physician    History of Present Illness: :    Reason for Consult:   This patient is being seen in consultation at the request of Dr. Cameron Ali for the management of metastatic prostate cancer admitted with new T3-T4 lesion requiring spinal fusion surgery.    HPI:  75 year old male with history of metastatic prostate cancer known to involve the spine since 2016 to the spine who presented with a 3 day history of an unsteady gait with recurrent falls, but no back pain or wwakness. He was found to have an expansile mass at T3-T4 causing significant cord compression. He denied any chest pain, pre-syncopal symptoms that precipitated the falls.     He went to the OR emergently with orthopedics for posterior cervical and thoracic decompression from T3-T5, and posterior cervical and thoracic fusion from C5-T8.  He is currently being managed in the Surgical ICU    Oncology history:  Primary oncologist: Dr. Verl Bangs  - 2013 - 5 lb weight loss, right hip pain with a right posterior acetabulum lytic lesion, and a PSA over 900.   - He was started on ADT with Lupron, which was changed to avodart and casodex d/t breast tenderness and decreased libido  - Currently on Lupron and Aberaterone/prednisone for his metastatic prostate cancer    Allergies: No Known Allergies    Current Medications:      ??? acetaminophen  650 mg Oral Q6H   ??? ceFAZolin (ANCEF) IV  2 g Intravenous Q8H   ??? dexamethasone  10 mg Intravenous Q6H   ??? docusate sodium  100 mg Oral Daily   ??? flu vacc [id number] 6mos up(PF)  0.5 mL Intramuscular During hospitalization   ??? pneumococcal polysacchride (23-valps)  0.5 mL Subcutaneous During hospitalization   ??? polyethylene glycol  17 g Oral Daily       Medical History:   Past Medical History:   Diagnosis Date   ??? Calculus of kidney    ??? Hypertension    ??? Malignant neoplasm of prostate (CMS-HCC)        Surgical History:   Past Surgical History:   Procedure Laterality Date   ??? PR UPGI ENDOSCOPY,FN NEEDLE BX,GUIDED N/A 02/11/2017    Procedure: UGI W/TRANSENDOSCOPIC US-GUIDE INTRA/TRANSMURAL NEEDLE ASP/BX (INCL EXAM ESOPHAGUS, STOMACH, DOUDENUM/JEJ);  Surgeon: Vonda Antigua, MD;  [facility];  Service: Gastroenterology   ??? URETEROSCOPY         Social History:   Social History     Social History   ??? Marital status: Married     Spouse name: N/A   ??? Number of children: N/A   ??? Years of education: N/A     Occupational History   ??? Not on file.     Social History Main Topics   ??? Smoking status: Never Smoker   ??? Smokeless tobacco: Never Used   ??? Alcohol use 0.6 oz/week     1 Cans of beer per week      Comment: now and then   ??? Drug use: Yes     Types: Marijuana      Comment: Every now and then for pain. per 01/31/17 report.    ??? Sexual activity: Not on file     Other Topics Concern   ??? Not on file     Social History Narrative   ??? No narrative on file       Family History: family history includes Hypertension in his mother; Prostate cancer in his other; Stroke in his mother.    Code Status:  Full Code    Review of Systems:  A 12 system review of systems was negative except as noted in HPI.    Objective: :  Patient Vitals for the past 8 hrs:   Temp Temp src Pulse SpO2 Pulse Resp SpO2   08/06/17 1400 - - 88 87 16 100 %   08/06/17 1300 - - 83 83 13 96 %   08/06/17 1200 36.9 ??C (98.5 ??F) Oral 87 87 10 100 %   08/06/17 1100 - - 96 95 14 99 %   08/06/17 1000 - - 97 97 23 100 %   08/06/17 0900 - - 88 89 18 98 %   08/06/17 0800 36.8 ??C (98.3 ??F) Oral 92 92 13 100 %   08/06/17 0700 - - 90 90 14 100 %   08/06/17 0630 - - 96 95 17 100 %     I/O this shift:  In: 650 [I.V.:600; IV Piggyback:50]  Out: 1100 [Urine:900; Drains:200]    Physical Exam:  GENERAL: Appears stated age. NAD, Limited exam as patient post-op  HEENT: Pupils equal, round, and reactive to light. No cervical or supraclav LN  HEART: Regular rate and rhythm. No rubs, gallops or murmurs. 2+ radial and DP pulses.  CHEST/LUNG: CTAB - auscultated anteriorly only  ABDOMEN: Soft, nontender, nondistended. No hepatosplenomegaly.   EXTREMITIES: No edema  SKIN: No rash or petechiae.   NEURO EXAM: Limited exam but no lower limb sensory deficit and able to flex both hips. No upper extremity weakness.  LYMPH NODES: No palpable supraclavicular, axillary or inguinal lymph nodes.    LABORATORY RESULTS:     CBC - Results in Past 2 Days  Result Component Current Result   WBC  9.2 (08/06/2017)   RBC 3.35 (L) (08/06/2017)   HCT 30.7 (L) (08/06/2017)   HGB 10.4 (L) (08/06/2017)   Platelet 180 (08/06/2017)   RDW 13.7 (08/06/2017)   MCV 91.7 (08/06/2017)   MCH 30.9 (08/06/2017)   MCHC 33.7 (08/06/2017)   MPV 7.6 (08/06/2017)     BMP - Results in Past 2 Days  Result Component Current Result   Sodium 143 (08/06/2017)   Potassium 3.8 (08/06/2017)   Chloride 108 (H) (08/06/2017)   CO2 20.0 (L) (08/06/2017)   BUN 15 (08/06/2017)   Creatinine 0.79 (08/06/2017)   Glucose 171 (08/06/2017)     Coagulation - No results found for requested labs within last 2 days.     LFT's - Results in Past 2 Days  Result Component Current Result   ALT 25 (08/05/2017)   AST 34 (08/05/2017)   Alkaline Phosphatase 87 (08/05/2017)   Albumin 4.4 (08/05/2017)   Total Protein 7.7 (08/05/2017)   Total Bilirubin 0.8 (08/05/2017)   Bilirubin, Direct Not in Time Range       IMAGING:  Metastasis involving the T3, T4, and T5 vertebral bodies with epidural extension at the T3-T4 levels causing severe spinal canal narrowing and compression of the spinal cord with resulting edema within the cord.    -Severe narrowing of the bilateral T3-4 and T4-5 neural foramina related to the tumor.      PATHOLOGY:  02/11/17  A: Pancreas, fine needle biopsy  - Neuroendocrine tumor grade 1    ??        PSA   Date Value Ref Range Status   05/19/2017 34.70 (H) 0.00 - 4.00 ng/mL Final   02/10/2017 30.50 (H) 0.00 - 4.00 ng/mL Final   01/20/2017 19.60 (H) 0.00 - 4.00 ng/mL Final   01/07/2017 18.70 (H) 0.00 - 4.00 ng/mL Final   12/22/2016 19.40 (H) 0.00 - 4.00 ng/mL Final   12/03/2016 31.70 (H) 0.00 - 4.00 ng/mL Final   11/04/2016 30.30 (H) 0.00 - 4.00 ng/mL Final   08/06/2016 18.80 (H) 0.00 - 4.00 ng/mL Final   05/06/2016 24.10 (H) 0.00 - 4.00 ng/mL Final   01/29/2016 12.50 (H) 0.00 - 4.00 ng/mL Final   10/30/2015 11.10 (H) 0.00 - 4.00 ng/mL Final   07/24/2015 7.41 (H) 0.00 - 4.00 ng/mL Final   04/03/2015 3.44 0.00 - 4.00 ng/mL Final   12/23/2014 12.50 (H) 0.00 - 4.00 ng/mL Final

## 2017-08-06 NOTE — Unmapped (Signed)
Inpatient Surgery Update      PRE-OP DOCUMENTATION: PROVIDER CERTIFICATION    I certify that the appropriate documentation of the patient's evaluation, assessment and treatment plan is found within the medical record and that the patient???s condition is unchanged from this earlier assessment.    SITE MARKING ATTESTATION    Site Marked: Yes    CONSENT FOR OPERATION OR PROCEDURE: PROVIDER CERTIFICATION    I hereby certify that the nature, purpose, benefits, usual and most frequent risks of, and alternatives to, the operation or procedure have been explained to the patient (or person authorized to sign for the patient) either by a physician or by the provider who is to perform the operation or procedure, that the patient has had an opportunity to ask questions, and that those questions have been answered. The patient or the patient's representatiive has been advised that the selected tasks may be performed by assistants to the primary health care provider(s). I believe that the patient (or person authorized to sign for the patient) understands what has been explained, and has consented to the operation or procedure.

## 2017-08-06 NOTE — Unmapped (Signed)
Report given to Reita Cliche, RNs. Patient care transferred at this time.

## 2017-08-06 NOTE — Unmapped (Signed)
Patient rounds completed. The following patient needs were addressed:  Pain, Toileting, Positioning;   SUPINE, Personal Belongings, Plan of Care, Call Bell in Reach and Bed Position Low . HOB elevated. NAD

## 2017-08-06 NOTE — Unmapped (Signed)
Attending Attestation  I saw and evaluated the patient, participating in the key portions of the service. I reviewed the resident???s note. I agree with the resident???s findings and plan.    Patient is admitted to the surgical ICU for monitoring after spine surgery.  Goal maps greater than 85-90.  Will monitor neurologic exam.  Is extubated and hemodynamically stable.  Will hold anticoagulation per surgical team.    Stephen Livings Makaleigh Reinard, MD    Surgical ICU Admission Note    Assessment:   76 year old male with known metastatic prostate cancer presents today with new compressive lesion in the T3-T4 interspace with symptomatic spinal cord compression.  He is now status post spinal decompression and fusion with orthopedics on 12/15 hemodynamically stable and extubated.    Plan: Transfer to the SICU under Trauma Surgery/Critical Care Service for critical care management.    Neuro: awake, oriented, appropriate.   *Pain control  -Dilaudid PCA  -Oxycodone as needed  -Scheduled Tylenol    *Spinal cord compression status post decompression and fusion 12/15  -Drains to remain in place until minimal output  -Continue Keflex until drains are out  - every 2 hours neurochecks. Motor and sensation grossly intact to lower extremity  -Continue Decadron for now    CV: HDS.    *History of hypertension  -Holding home amlodipine  -Map goal 85-100 for spinal perfusion  -Phenylephrine as needed    Pulm: Satting well on room air.   -Pulmonary toilet    FEN/GI:  F: IVF.  LR at 75 cc/h  E: replete electrolytes PRN  N: clear liquid diet.  Can advance diet as tolerated.  - bowel regimen    Renal/GU:   - Cr wnl. Adequate UOP.  - Foley for accurate I/Os in critically ill patient    *Hematuria and history of prostate cancer  - Urology consult in AM    Heme: H/H stable.     ID: Afebrile.   - Keflex for prophylaxis per SRO    Endo: No active issues.    Lines, tubes, drains: PIV, foley    Dispo: Admit to SICU. __________________________________________________  PCP: [facility]     History of Present Illness:     76 year old male with history of prostate cancer and hypertension with known prostatic metastasis since 2016 to the spine who presented today with a mass at T3-T4 causing cord compression.  Per report he was in his usual state of health until 3 days prior to admission when he began to fall frequently.  He denied any prodromal symptoms or syncope.  He also endorses worsening back pain over the last several days.  No history of bowel or bladder changes.  He went to the OR emergently with orthopedics for posterior cervical and thoracic decompression from T3-T5, and posterior cervical and thoracic fusion from C5-T8.  He was transferred to the ICU postoperatively for monitoring of his MAPS with a goal of 85-100.    Allergies:  Patient has no known allergies.    Home Medications:   Prescriptions Prior to Admission   Medication Sig Dispense Refill Last Dose   ??? abiraterone (ZYTIGA) 250 mg Tab tablet Take 4 tablets (1,000 mg total) by mouth daily. 120 tablet 11 Taking   ??? allopurinol (ZYLOPRIM) 100 MG tablet TAKE 1 TABLET EVERY DAY 90 tablet 3    ??? amLODIPine (NORVASC) 5 MG tablet Take 5 mg by mouth daily.    Taking   ??? atorvastatin (LIPITOR) 10 MG tablet Take  10 mg by mouth nightly.    Taking   ??? bicalutamide (CASODEX) 50 MG tablet TAKE 1 TABLET EVERY DAY 90 tablet 4    ??? predniSONE (DELTASONE) 5 MG tablet Take 1 tablet (5 mg total) by mouth Two (2) times a day. 60 tablet 11 Taking   ??? tamsulosin (FLOMAX) 0.4 mg capsule TAKE 1 CAPSULE EVERY DAY 90 capsule 3        Medical History:  Past Medical History:   Diagnosis Date   ??? Calculus of kidney    ??? Hypertension    ??? Malignant neoplasm of prostate (CMS-HCC)        Surgical History:  Past Surgical History:   Procedure Laterality Date   ??? PR UPGI ENDOSCOPY,FN NEEDLE BX,GUIDED N/A 02/11/2017    Procedure: UGI W/TRANSENDOSCOPIC US-GUIDE INTRA/TRANSMURAL NEEDLE ASP/BX (INCL EXAM ESOPHAGUS, STOMACH, DOUDENUM/JEJ);  Surgeon: Vonda Antigua, MD;  [facility];  Service: Gastroenterology   ??? URETEROSCOPY         Social History:  Tobacco use:   reports that he has never smoked. He has never used smokeless tobacco.  Alcohol use:   reports that he drinks about 0.6 oz of alcohol per week .  Drug use:  reports that he uses drugs, including Marijuana.    Review of Systems:  A 12 system review of systems was negative except as noted in HPI.    Objective  Vitals Reviewed:    Temp:  [35.7 ??C-36.8 ??C] 36.8 ??C  Heart Rate:  [70-92] 91  SpO2 Pulse:  [91-92] 91  Resp:  [13-19] 16  BP: (128-168)/(79-96) 140/96  A BP-1: (133-152)/(65-72) 152/70  MAP:  [93 mmHg-101 mmHg] 100 mmHg  SpO2:  [98 %-100 %] 99 %   Temp (24hrs), Avg:36.3 ??C, Min:35.7 ??C, Max:36.8 ??C     SpO2: 99 %             There is no height or weight on file to calculate BMI.    There is no height or weight on file to calculate BSA.     Intake/Output Summary (Last 24 hours) at 08/06/17 0554  Last data filed at 08/06/17 0329   Gross per 24 hour   Intake             2650 ml   Output              950 ml   Net             1700 ml        No intake/output data recorded.   I/O this shift:  In: 2650 [I.V.:1400; IV Piggyback:1250]  Out: 950 [Urine:450; Blood:500]         Physical Exam:    Gen: well-appearing, no acute distress  HEENT: sclera anicteric. PERRLA. Moist mucous membranes.  2 drain in place with SS output.   CV: regular rate and rhythm  Resp: Good air movement. Breathing is comfortable and non-labored.  Abd: soft, non-tender, non-distended  Ext: warm, well-perfused. No edema. Distal pulses are palpable. Strength 4-5/5 with dorsiflexion and plantar flexion. Sensation grossly intact  Neuro: alert and oriented, non-focal      Labs/Studies:   Lab Results   Component Value Date    WBC 9.2 08/06/2017    HGB 10.4 (L) 08/06/2017    HCT 30.7 (L) 08/06/2017    PLT 180 08/06/2017       Lab Results   Component Value Date    NA 143 08/06/2017  K 3.8 08/06/2017    CL 108 (H) 08/06/2017    CO2 20.0 (L) 08/06/2017    BUN 15 08/06/2017    CREATININE 0.79 08/06/2017    CALCIUM 8.6 08/06/2017    MG 1.6 08/06/2017    PHOS 4.3 08/06/2017       Lab Results   Component Value Date    PT 16.0 (H) 02/17/2017    INR 1.41 02/17/2017    APTT 27.9 02/17/2017

## 2017-08-07 LAB — BASIC METABOLIC PANEL
ANION GAP: 10 mmol/L (ref 9–15)
BLOOD UREA NITROGEN: 20 mg/dL (ref 7–21)
BUN / CREAT RATIO: 27
CALCIUM: 8.9 mg/dL (ref 8.5–10.2)
CHLORIDE: 104 mmol/L (ref 98–107)
CO2: 25 mmol/L (ref 22.0–30.0)
CREATININE: 0.75 mg/dL (ref 0.70–1.30)
EGFR MDRD AF AMER: >=60 mL/min/{1.73_m2} (ref >=60)
GLUCOSE RANDOM: 118 mg/dL (ref 65–179)
POTASSIUM: 4.2 mmol/L (ref 3.5–5.0)
SODIUM: 139 mmol/L (ref 135–145)

## 2017-08-07 LAB — CBC
HEMATOCRIT: 26.7 % — ABNORMAL LOW (ref 41.0–53.0)
MEAN CORPUSCULAR HEMOGLOBIN CONC: 34.6 g/dL (ref 31.0–37.0)
MEAN CORPUSCULAR HEMOGLOBIN: 31.3 pg (ref 26.0–34.0)
MEAN CORPUSCULAR VOLUME: 90.5 fL (ref 80.0–100.0)
MEAN PLATELET VOLUME: 8 fL (ref 7.0–10.0)
PLATELET COUNT: 183 10*9/L (ref 150–440)
RED BLOOD CELL COUNT: 2.96 10*12/L — ABNORMAL LOW (ref 4.50–5.90)
RED CELL DISTRIBUTION WIDTH: 13.8 % (ref 12.0–15.0)
WBC ADJUSTED: 15.1 10*9/L — ABNORMAL HIGH (ref 4.5–11.0)

## 2017-08-07 LAB — CALCIUM: Calcium:MCnc:Pt:Ser/Plas:Qn:: 8.9

## 2017-08-07 LAB — HEMOGLOBIN: Lab: 9.3 — ABNORMAL LOW

## 2017-08-07 LAB — PHOSPHORUS: Phosphate:MCnc:Pt:Ser/Plas:Qn:: 3.8

## 2017-08-07 LAB — MAGNESIUM: Magnesium:MCnc:Pt:Ser/Plas:Qn:: 1.8

## 2017-08-07 NOTE — Unmapped (Signed)
Problem: Patient Care Overview  Goal: Plan of Care Review  Outcome: Progressing  VSS, A&Ox4. O2 sats maintained on RA. Pain controlled w/ dPCA 0.1/10/4.8. 2 JP drains to back w/ moderate amount of dark red bloody output. Dressing to back remains clean & intact. Foley in w/ adequate UOP, red amber in color. Pt tolerating regular diet. No falls or injuries. Skin remains intact. All safety measures in place, HOB @ 30 degrees per order, will continue to monitor.   Goal: Individualization and Mutuality  Outcome: Progressing    Goal: Discharge Needs Assessment  Outcome: Progressing    Goal: Interprofessional Rounds/Family Conf  Outcome: Progressing      Problem: Skin Injury Risk (Adult)  Goal: Identify Related Risk Factors and Signs and Symptoms  Related risk factors and signs and symptoms are identified upon initiation of Human Response Clinical Practice Guideline (CPG).   Outcome: Progressing    Goal: Skin Health and Integrity  Patient will demonstrate the desired outcomes by discharge/transition of care.   Outcome: Progressing      Problem: Fall Risk (Adult)  Goal: Identify Related Risk Factors and Signs and Symptoms  Related risk factors and signs and symptoms are identified upon initiation of Human Response Clinical Practice Guideline (CPG).   Outcome: Progressing    Goal: Absence of Fall  Patient will demonstrate the desired outcomes by discharge/transition of care.   Outcome: Progressing      Problem: Self-Care Deficit (Adult,Obstetrics,Pediatric)  Goal: Identify Related Risk Factors and Signs and Symptoms  Related risk factors and signs and symptoms are identified upon initiation of Human Response Clinical Practice Guideline (CPG).   Outcome: Progressing    Goal: Improved Ability to Perform BADL and IADL  Patient will demonstrate the desired outcomes by discharge/transition of care.   Outcome: Progressing

## 2017-08-07 NOTE — Unmapped (Signed)
Problem: Patient Care Overview  Goal: Plan of Care Review   08/06/17 1828   OTHER   Plan of Care Reviewed With patient;spouse   Neuro assessments have been consistent throughout the day. Pt denies any numbness or tingling. Can break gravity with all extremities. Map has remained greater than 80.  See flow sheet for VS and assessments. All treatments and procedures explained, questions addressed.   Goal: Individualization and Mutuality   08/06/17 1828   Individualization   Patient Specific Preferences Pt wants to be home for Christmas  (Pt wants to be home for Christmas)       Problem: Skin Injury Risk (Adult)  Intervention: Turn/Reposition Often   08/06/17 1828   Interventions   Pressure Reduction Techniques frequent weight shift encouraged      08/06/17 1828   Interventions   Pressure Reduction Techniques frequent weight shift encouraged;positioned off wounds         Problem: Fall Risk (Adult)  Intervention: Review Medications/Identify Contributors to Fall Risk   08/06/17 1828   Interventions   Medication Review/Management medications reviewed;high risk medications identified         Problem: Self-Care Deficit (Adult,Obstetrics,Pediatric)  Intervention: Promote Patient Activity Focusing on Functional Independence   08/06/17 1828   Interventions   Activity Management activity adjusted per tolerance   Activity Assistance Provided assistance, 2 people   Environmental Safety Modification lighting adjusted   Self-Care Promotion independence encouraged

## 2017-08-07 NOTE — Unmapped (Addendum)
Problem: Skin Injury Risk (Adult)  Goal: Identify Related Risk Factors and Signs and Symptoms  Related risk factors and signs and symptoms are identified upon initiation of Human Response Clinical Practice Guideline (CPG).   Outcome: Progressing   08/07/17 0009   Skin Injury Risk (Adult)   Related Risk Factors (Skin Injury Risk) advanced age;mobility impaired     Goal: Skin Health and Integrity  Patient will demonstrate the desired outcomes by discharge/transition of care.   Outcome: Progressing   08/07/17 0009   Skin Injury Risk (Adult)   Skin Health and Integrity making progress toward outcome       Problem: Fall Risk (Adult)  Goal: Identify Related Risk Factors and Signs and Symptoms  Related risk factors and signs and symptoms are identified upon initiation of Human Response Clinical Practice Guideline (CPG).   Outcome: Progressing   08/07/17 0009   Fall Risk (Adult)   Related Risk Factors (Fall Risk) age-related changes;history of falls;fear of falling   Signs and Symptoms (Fall Risk) presence of risk factors     Goal: Absence of Fall  Patient will demonstrate the desired outcomes by discharge/transition of care.   Outcome: Progressing   08/07/17 0009   Fall Risk (Adult)   Absence of Fall making progress toward outcome       Problem: Self-Care Deficit (Adult,Obstetrics,Pediatric)  Goal: Identify Related Risk Factors and Signs and Symptoms  Related risk factors and signs and symptoms are identified upon initiation of Human Response Clinical Practice Guideline (CPG).   Outcome: Progressing   08/07/17 0009   Self-Care Deficit (Adult,Obstetrics,Pediatric)   Related Risk Factors (Self-Care Deficit) environmental barriers;medication effects   Signs and Symptoms (Self-Care Deficit) weakness, paresis, paralysis     Goal: Improved Ability to Perform BADL and IADL  Patient will demonstrate the desired outcomes by discharge/transition of care.   Outcome: Progressing   08/07/17 0009   Self-Care Deficit (Adult,Obstetrics,Pediatric)   Improved Ability to Perform BADL and IADL making progress toward outcome       Comments:Pain well controlled, patient rested all night with vitals stable. Will continue to monitor.

## 2017-08-08 DIAGNOSIS — G9519 Other vascular myelopathies: Principal | ICD-10-CM

## 2017-08-08 LAB — WBC ADJUSTED: Lab: 15.6 — ABNORMAL HIGH

## 2017-08-08 LAB — CBC
HEMATOCRIT: 28.7 % — ABNORMAL LOW (ref 41.0–53.0)
HEMOGLOBIN: 9.7 g/dL — ABNORMAL LOW (ref 13.5–17.5)
MEAN CORPUSCULAR HEMOGLOBIN CONC: 33.6 g/dL (ref 31.0–37.0)
MEAN CORPUSCULAR HEMOGLOBIN: 31.3 pg (ref 26.0–34.0)
MEAN PLATELET VOLUME: 7.9 fL (ref 7.0–10.0)
RED BLOOD CELL COUNT: 3.09 10*12/L — ABNORMAL LOW (ref 4.50–5.90)
RED CELL DISTRIBUTION WIDTH: 13.9 % (ref 12.0–15.0)
WBC ADJUSTED: 15.6 10*9/L — ABNORMAL HIGH (ref 4.5–11.0)

## 2017-08-08 LAB — PHOSPHORUS: Phosphate:MCnc:Pt:Ser/Plas:Qn:: 2 — ABNORMAL LOW

## 2017-08-08 LAB — TESTOSTERONE TOTAL: Testosterone:MCnc:Pt:Ser/Plas:Qn:: 13 — ABNORMAL LOW

## 2017-08-08 LAB — MAGNESIUM: Magnesium:MCnc:Pt:Ser/Plas:Qn:: 2.1

## 2017-08-08 LAB — PSA, DIAGNOSTIC: PROSTATE SPECIFIC ANTIGEN: 80.9 ng/mL — ABNORMAL HIGH (ref 0.00–4.00)

## 2017-08-08 LAB — PROSTATE SPECIFIC ANTIGEN: Prostate specific Ag:MCnc:Pt:Ser/Plas:Qn:: 80.9 — ABNORMAL HIGH

## 2017-08-08 NOTE — Unmapped (Signed)
Problem: Patient Care Overview  Goal: Plan of Care Review  Outcome: Progressing  No acute events this shift, pt. Progressing appropriately. A/O x4. VS relatively stable, BP slightly elevated at beginning of shift. O2 sats maintained on RA. PCA pump d/c during shift. Pt. Educated about pain medication options. PRN pain medication given throughout shift, pt. Having a hard time managing pain in neck. Surgical dressings on neck and mid back remain clean, dry, and intact. PIV maintained with MIVF and antibiotics. Adequate intake and output through shift. Foley catheter remains in place, urine becoming more yellow. JP drains from back draining moderate amount of bloody serosanguinous fluid. Pt. On q2 turning schedule. All safety measures in place, will continue to monitor closely.  Goal: Individualization and Mutuality  Outcome: Progressing    Goal: Discharge Needs Assessment  Outcome: Progressing    Goal: Interprofessional Rounds/Family Conf  Outcome: Progressing      Problem: Skin Injury Risk (Adult)  Goal: Identify Related Risk Factors and Signs and Symptoms  Related risk factors and signs and symptoms are identified upon initiation of Human Response Clinical Practice Guideline (CPG).   Outcome: Progressing    Goal: Skin Health and Integrity  Patient will demonstrate the desired outcomes by discharge/transition of care.   Outcome: Progressing      Problem: Fall Risk (Adult)  Goal: Identify Related Risk Factors and Signs and Symptoms  Related risk factors and signs and symptoms are identified upon initiation of Human Response Clinical Practice Guideline (CPG).   Outcome: Progressing    Goal: Absence of Fall  Patient will demonstrate the desired outcomes by discharge/transition of care.   Outcome: Progressing      Problem: Self-Care Deficit (Adult,Obstetrics,Pediatric)  Goal: Identify Related Risk Factors and Signs and Symptoms  Related risk factors and signs and symptoms are identified upon initiation of Human Response Clinical Practice Guideline (CPG).   Outcome: Progressing    Goal: Improved Ability to Perform BADL and IADL  Patient will demonstrate the desired outcomes by discharge/transition of care.   Outcome: Progressing      Problem: VTE, DVT and PE (Adult)  Goal: Signs and Symptoms of Listed Potential Problems Will be Absent, Minimized or Managed (VTE, DVT and PE)  Signs and symptoms of listed potential problems will be absent, minimized or managed by discharge/transition of care (reference VTE, DVT and PE (Adult) CPG).   Outcome: Progressing

## 2017-08-08 NOTE — Unmapped (Signed)
ORTHOPAEDIC SURGERY PROGRESS NOTE  Attending: Cameron Ali    s/p posterior cervical and thoracic decompression from T3-T5, posterior cervical and thoracic fusion from C7-T8     ASSESSMENT/PLAN:  [name] is a 76 y.o. male doing well postoperatively.     -Activities as tolerated  -To floor today  -Prednisone 5mg  BID per onc recs  -Ancef until drains are out   -Drains to bulb suction  -HOB as tolerated  -Q4 hour neuro checks  -Dressing: Keep intact, change POD2-3  -DVT Ppx: Will discuss. Please hold for now.     SUBJECTIVE:  Doing well post-operatively. Pain controlled.  TL floor today    OBJECTIVE:  PE:    Vitals:    08/07/17 1854   BP: 165/90   Pulse: 86   Resp: 16   Temp: 36.5 ??C   SpO2: 98%     Alert and oriented.   RLE:   5-/5 TA, 4/5 EHL, 5-/5 GS, 4/5 quad, 4/5 IP  LLE:  5-/5 TA, 5-/5 EHL, 5-/5 GS, 4/5 quad, 4/5 IP  SILT: L3-S1    * Please page Clement Sayres, NP [phone] with any questions while patient in inpatient. Please contact the resident who leaves daily progress notes for any questions between 3-5PM and then Page orthopaedic consult pager [phone]) on nights (after 5PM) and weekends    - Current orthopaedic contact resident: Warren Danes [phone]  - Current orthopaedic care attending: Cavanaugh  - On nights (6pm-6am), weekends, and holidays, please page Orthopaedic Consult pager.

## 2017-08-08 NOTE — Unmapped (Signed)
OCCUPATIONAL THERAPY  Evaluation (08/08/17 1135)    Patient Name:  Stephen Bartlett       Medical Record Number: [id number]   Date of Birth: 1941-03-10  Sex: Male          OT Treatment Diagnosis:  OT consult - debility     Assessment  Stephen Bartlett is a 76 year old male with known metastatic prostate cancer presents today with new compressive lesion in the T3-T4 interspace with symptomatic spinal cord compression.  He is now status post spinal decompression and fusion with orthopedics on 12/15.  Pt. presents with impairments in activity tolerance, ADL management, transfers, functional mobility and balance.      Today's Interventions: Adaptive equipment;ADL retraining;Balance activities;Bed mobility;Compensatory tech. training;Education - Patient;Education - Family / caregiver;Endurance activities;Functional mobility;Home exercise program;Safety education;Therapeutic exercise;Transfer training;UE Strength / coordination exercise;Neuromuscular re-education;Postular / Proximal stability;Positioning;Functional cognition. Based on the daily activity AM-PAC raw score of 19/24, the pt is considered to be 43% impaired with self care. This indicates pt. Would benefit from 5x/week low intensive OT post acute.      Activity Tolerance During Today's Session  Patient tolerated treatment well    Plan  Planned Frequency of Treatment:  1x per day for: 3-4x week       Planned Interventions:  Adaptive equipment;ADL retraining;Bed mobility;Balance activities;Compensatory tech. training;Education - Patient;Education - Family / caregiver;Endurance activities;Functional mobility;Home exercise program;Safety education;Range of motion;Neuromuscular re-education;Therapeutic exercise;Transfer training;UE Strength / coordination exercise    Post-Discharge Occupational Therapy Recommendations:  OT Post Acute Discharge Recommendations: 5x weekly;Low intensity    ;    OT DME Recommendations: Defer to post acute    GOALS:   Patient and Family Goals: to go home     Long Term Goal #1: Pt. will return to PLOF        Short Term:  Pt. will complete full body dressing at mod I    Time Frame : 2 weeks  Pt. will complete standing grooming at sink for 2+ minutes at mod I    Time Frame : 2 weeks  Pt. will complete toilet transfer at mod I    Time Frame : 2 weeks                  Prognosis:  Good  Positive Indicators:     Barriers to Discharge: Endurance deficits;Inability to safely perform ADLS;Impaired Balance    Subjective  Current Status    Prior Functional Status independent prior to admission for ADL and functional mobility until fall ~ 3 weeks ago when pt. began to use a walker        Services patient receives: OT;PT       Past Medical History:   Diagnosis Date   ??? Calculus of kidney    ??? Hypertension    ??? Malignant neoplasm of prostate (CMS-HCC)     Social History   Substance Use Topics   ??? Smoking status: Never Smoker   ??? Smokeless tobacco: Never Used   ??? Alcohol use 0.6 oz/week     1 Cans of beer per week      Comment: now and then      Past Surgical History:   Procedure Laterality Date   ??? PR UPGI ENDOSCOPY,FN NEEDLE BX,GUIDED N/A 02/11/2017    Procedure: UGI W/TRANSENDOSCOPIC US-GUIDE INTRA/TRANSMURAL NEEDLE ASP/BX (INCL EXAM ESOPHAGUS, STOMACH, DOUDENUM/JEJ);  Surgeon: Vonda Antigua, MD;  [facility];  Service: Gastroenterology   ??? URETEROSCOPY      Family History  Problem Relation Age of Onset   ??? Hypertension Mother    ??? Stroke Mother    ??? Prostate cancer Other    ??? GU problems Neg Hx    ??? Kidney cancer Neg Hx    ??? Urolithiasis Neg Hx         Patient has no known allergies.     Objective Findings  Precautions / Restrictions   (post-op back precautions )    Weight Bearing  Non-applicable    Required Braces or Orthoses  Non-applicable    Communication Preference       Pain  pt. reported mild neck pain but did not quantify, alleviated with rest     Equipment / Environment  Vascular access (PIV, TLC, Port-a-cath, PICC) Living Situation   Living environment: House   Lives With: Spouse (Pt. reported that his spouse works during the day )   Home Living: One level home;Stairs to enter with rails;Tub/shower unit;Standard height toilet   Equipment available at home: [employer]   Orientation Level:  Oriented x 4   Arousal/Alertness:  Appropriate responses to stimuli   Attention Span:  Attends with cues to redirect   Memory:  Appears intact   Following Commands:  Follows one step commands with increased time;Follows one step commands with repetition   Safety Judgment:  Good awareness of safety precautions   Awareness of Errors:  Good awareness of safety precautions   Problem Solving:  Able to problem solve independently   Comments: Pt. scored 27/30 on O-log assessment.  Pt. needed cues for redirection and often responded off topic.      Vision / Perception  Vision: Wears glasses all the time          Hand Function     bilateral hands WFL     Skin Inspection  visible skin c/d/i     ROM / Strength/Coordination  UE ROM/ Strength/ Coordination: BUE WFL        Sensation:  no c/o paresthesia     Balance:  CGA for static standing balance with two handed support     Mobility/Gait/Transfers: sit to stand independently     ADL:     Grooming: min A for standing grooming at sink   Dressing: SBA for lower body dressing   Eating: setup   Toileting: min A        Vitals/ Orthostatics:  At Rest: no s/s of distress   With Activity: no s/s of distress        Interventions Performed During Today's Session: Pt. educated on AE (tub transfer bench, 3 in 1 commode chair), safety, transfers.  Pt. able to complete ADL assessment as well as room level functional mobility, lower body dressing with education on comepsnatory dressing techniques, and standing grooming at sink.      OT G-codes  Functional Assessment Tool Used: clinical reasoning - AMPAC   Functional Limitation: Self care  Self Care Current Status [phone]): At least 40 percent but less than 60 percent impaired, limited or restricted  Self Care Goal Status (U0454): At least 20 percent but less than 40 percent impaired, limited or restricted  Rationale: Pt. scored 19 on AMPAC indicating a 43% impairment in self care     Eval Duration (OT): 25 Min.         I attest that I have reviewed the above information.  Signed: Samuel Bouche, OT  Filed 08/08/2017

## 2017-08-08 NOTE — Unmapped (Signed)
Orthopaedic Surgery Operative Note (CSN: [id number])  Date of Surgery: 08/05/2017  Admit Date: 08/05/2017      Preoperative Diagnosis: Thoracic metastasis with cord progression and progressive neurologic deficit    Postoperative Diagnosis: Same    Procedure:  1.  Posterior fusion of C7-T8 (CPT 22600, [id number] x 7)  2.  Posterior segmental instrumentation from C7-T8 (CPT [id number])  3.  Decompressive laminectomies including partial medial facet resection of T2 through T5 (CPT 63045, 63048 x 3)  4.  Use of intraoperative CT guidance for screw placement (CPT [phone])  5.  Application of allograft bone (CPT 20930)    Surgeons:  Primary: Timothy Lasso, MD  Resident - Assisting: Benson Setting, MD; Jacklynn Barnacle, MD    Operative Findings:  1.  Successful instrumentation from C7-T8 with use of CT guidance.  2.  Severe cord compression secondary to extensive tumor from T3-T5  3.  Neuro monitoring utilized for the case.  At baseline, somatosensory potentials are present in all extremities.  No motor function could be obtained in the lower extremities at baseline.  The signals remained unchanged throughout the case.    Implants:   Medtronic Solera 5.5 and Medtronic Infinity  C7: 4.5 x 28 mm (L), 4.5 x 24 mm (R) pedicle  T1: 5.5 x 25 mm (B)  T2: 5.5 x 35 mm (B)  T3-5: skipped  T6-8: 5.5 x 40 mm (B)    Rods: 3.5-5.5 mm CoCr    Bone: medtronic Magnifuse allograft    Specimens: All laminectomy bone was sent for permanent pathology    Anesthesia: General.     EBL: 400 cc    Indications for Surgery: Stephen Bartlett is a 76 y.o. male with history of metastatic prostate cancer.  He was previously ambulatory but developed progressive lower extremity weakness and increasing falling.  He was noted to have severe cord compression secondary to retropulsion of tumor in his upper thoracic spine.  Given the extent of the cord compression, I felt that the wide laminectomy was required to adequately decompress his cord and thus fusion to prevent instability.  I indicated him for decompression fusion.    Risks and benefits of the procedure were discussed. These include but are not limited to adjacent segment disease, dural tear, CSF leak, bleeding, need for transfusion, infection, damage to neurovascular structures, maluninion, nonunion, loss of fixation, post-operative stiffness, need for revision surgery or failure of graft or implant. Catastrophic consequences including but not limited to death, paralysis or pulmonary embolism were also discussed. The patient understood these risks and elected to proceed.    Procedure:    The patient was identified in the preoperative holding area and brought back to the operative suite. A pre-procedure timeout was performed. Anesthesia was induced. The patient was then positioned prone with hips extended on a Jackson frame. Care was taken to pad all bony prominences. Neuromonitoring was used and baseline signals were obtained. Somatosensory potentials were obtained in all extremities, however motor function was not present in the bilateral lower extremities at baseline. The spine was then prepped in the usual sterile fashion. The patient received preoperative antibiotics according to protocol. A second pre-incision timeout was performed.    Longitudinal incision was made over the C7-T8 interval.  Subcutaneous tissue was dissected with Bovie meticulous hemostasis maintained throughout the procedure.  Dorsal fascia was identified divided longitudinally.  I then dissected the paraspinal muscle subperiosteally off of the spinous processes and lamina from C7-T8.  Levels were confirmed with  fluoroscopic imaging and deep retractors were then placed.  At this point I then attached navigational clamps of the spine and then brought in the intraoperative CT.  CT scanning was then performed for guidance.  The CT scan was then removed.  I then used intraoperative CT scan guidance with Stealth to place bilateral pedicle screws at C7, T1, T2.  Levels of T3 through 5 were skipped given the extent of the tumor involvement.  Bilateral pedicle screws are placed at T6 through 8 with excellent purchase at all levels.  All signals remained unchanged after screws were placed.  At this point I then proceeded with decompression.  I used a rongeur to remove the inferior half of the T2 spinous process, the T3-T5 spinous processes.  Gross laminectomy of the levels was performed to the [facility].  All bone was sent for pathology for permanent specimen.  Once a gross laminectomy was performed, I then thinned the lamina of the inferior half of T2, all of T3 and T4, and the superior half of T5 with a 6 mm cutting bur followed by 6 mm diamond bur.  I then performed a central decompression using a 2 and a 3 mm Kerrison.  Under the T3 and T4 lamina, there is severe cord compression secondary to invasion of the canal with tumor ventrally from the body.  Additionally, there is some dorsal invasion of the canal from the lamina as well.  I then proceeded to assess the lateral recess and there was severe cord compression in this area as well and I undercut the facet joints bilaterally at all levels with a 3 mm Kerrison.  The cord was well decompressed throughout these levels.  Although there was still some ventral tumor at the level of T3 and T4, I do not feel that it was terribly compressive.  At this point I then irrigated the wound copiously with a dilute solution of Betadine followed by saline.  I then selected rods and placed into the screw cradles after they were contoured and secured to construct in place.  I then decorticated the lateral mass of C7 as well as the transverse processes of T1 through T8.  I decorticated the remaining lamina did not been resected with the laminectomy as well as the facet joints at each levels with a 4 mm bur.  I then packed these areas with allograft bone.  2 size 15 Blake drains were then left below the fascia.  2 g of vancomycin were placed below the fascia.  I then closed fascia with interrupted #1 Vicryl.  Subcutaneous tissue was closed with 2-0 Vicryl and skin with 2-0 nylon.    Anesthesia was discontinued. Patient was flipped back supine on to the stretcher. Patient then awoke moving all extremities and left the operating room in stable condition. All instrument counts were correct at the end of the case. Neuromonitoring signals remained at baseline at the end of the case.    Post-operative Plan: He will mobilize with physical and Occupational Therapy.  We will consult radiation oncology about potentially starting radiation treatment through the remainder of his tumor 2 weeks after surgery    VTE prophylaxis: Patient is high risk for compressive hematoma, will use SCDs and early ambulation.    Teaching Surgeon Attestation: I was present, scrubbed and an active participant for the entirety of the case.      Rande Brunt Lajean Boese   Date: 08/08/2017  Time: 12:33 PM

## 2017-08-08 NOTE — Unmapped (Signed)
ABG with lytes and cbc final results have been forwarded to the ordering providers, Dr Alfonse Spruce and Dr Linton Rump for their review and their course of action.

## 2017-08-08 NOTE — Unmapped (Signed)
PHYSICAL THERAPY  Evaluation (08/08/17 0825)     Patient Name:  Stephen Bartlett       Medical Record Number: [id number]   Date of Birth: 1941-04-18  Sex: Male            Treatment Diagnosis: s/p spinal decompression and fusion     ASSESSMENT    Pt is 76 y.o. male with history of HTN, pancreatitis, bronchiectasis, kidney stones, metastatic prostate cancer on ADT, and neuroendocrine tumor who presents to the ED for evaluation of multiple falls in the last 2 days.  Pt with new compressive lesion in the T3-T4 interspace with symptomatic spinal cord compression.  He is now status post spinal decompression and fusion and requiring min A for all mobility at this time.  Pt with variant help available at home and with mild cognitive deficits requiring redirection during session.  At this time, pt is most appropriate for post acute PT 5x/week.  Based on the AM-PAC 5 item raw score of 16/24, the patient is considered to be 47.12% impaired with basic mobility.    Today's Interventions: Performed PT Evaluation, educated on post op back precautions, transfer training with RW          PLAN  Planned Frequency of Treatment:  1-2x per day for: 4-5x week      Planned Interventions: Balance activities;Education - Patient;Education - Family / caregiver;Functional mobility;Gait training;Therapeutic exercise;Therapeutic activity;Transfer training;Diaphragmatic / Pursed-lip breathing;Functional cognition    Post-Discharge Physical Therapy Recommendations:  5x weekly;Low intensity        PT DME Recommendations: Defer to post acute     Goals:   Patient and Family Goals: to get better             SHORT GOAL #1: Pt will perform bed mobility with mod I               Time Frame : 2 weeks  SHORT GOAL #2: pt will perform all tranfsers with supervision using LRAD               Time Frame : 2 weeks  SHORT GOAL #3: Pt will ambulate >/= 100 ft with LRAD with supervision               Time Frame : 2 weeks  SHORT GOAL #4: Pt will negotiate up/down 4 steps with supervision               Time Frame : 2 weeks                      Prognosis:  Good  Barriers to Discharge: Gait instability;Impaired balance;Pain  Positive Indicators: age, PLOF, motivation     SUBJECTIVE  Patient reports: Pt agreeable to PT   Current Functional Status: Pt min A for bed mobility, transfers and ambulation with RW   Services patient receives: PT;OT  Prior functional status: Pt reports he was independent with mobility and no use of assistive device until ~ 1 month ago in which he began using a cane   Equipment available at home: [employer]    Past Medical History:   Diagnosis Date   ??? Calculus of kidney    ??? Hypertension    ??? Malignant neoplasm of prostate (CMS-HCC)     Social History   Substance Use Topics   ??? Smoking status: Never Smoker   ??? Smokeless tobacco: Never Used   ??? Alcohol use 0.6 oz/week     1 Cans of  beer per week      Comment: now and then      Past Surgical History:   Procedure Laterality Date   ??? PR UPGI ENDOSCOPY,FN NEEDLE BX,GUIDED N/A 02/11/2017    Procedure: UGI W/TRANSENDOSCOPIC US-GUIDE INTRA/TRANSMURAL NEEDLE ASP/BX (INCL EXAM ESOPHAGUS, STOMACH, DOUDENUM/JEJ);  Surgeon: Vonda Antigua, MD;  Location: [facility];  Service: Gastroenterology   ??? URETEROSCOPY      Family History   Problem Relation Age of Onset   ??? Hypertension Mother    ??? Stroke Mother    ??? Prostate cancer Other    ??? GU problems Neg Hx    ??? Kidney cancer Neg Hx    ??? Urolithiasis Neg Hx         Allergies: Patient has no known allergies.                Objective Findings              Precautions:  (post op back precautions )              Weight Bearing Status: Non- applicable              Required Braces or Orthoses: Non- applicable       Pain Comments: reports 7/10 pain in back   Medical Tests / Procedures: OR 12/15: post spinal decompression and fusion MRI spine: Metastasis involving the T3, T4, and T5 vertebral bodies with epidural extension at the T3-T4 levels causing severe spinal canal narrowing and compression of the spinal cord with resulting edema within the cord.  Equipment / Environment: Vascular access (PIV, TLC, Port-a-cath, PICC);JP drain(s)             Airway Clearance: mobility     Living environment: House  Lives With: Spouse  Home Living: One level home;Stairs to enter with rails        Number of Stairs: 3    Cognition: Pt alert, oriented x 3  but at times with tangential conversation but easily redirected   Visual / Perception Status: wearing glasses   Skin Inspection: dressing in place to back along with drains being in place     UE ROM: B UE WFL   UE Strength: B UE WFL   LE ROM: B LE WFL   LE Strength: B LE WFL                 Coordination: intact    Proprioception: normal   Sensation: intact   Balance: sitting balance with supervision, standing balance with CGA    Posture: Upright      Bed Mobility: Supine to sit via log rolling with min A   Transfers: Sit<>Stand with min A using RW    Gait: Ambulated x 12 ft using RW and min A for stability.          Endurance: no issues     Eval Duration(PT): 25 Min.       PT G-Codes  Functional Assessment Tool Used: AMPAC   Score: 16/24   Functional Limitation: Mobility: Walking and moving around  Mobility: Walking and Moving Around Current Status [phone]): At least 40 percent but less than 60 percent impaired, limited or restricted  Mobility: Walking and Moving Around Goal Status [phone]): At least 1 percent but less than 20 percent impaired, limited or restricted  Rationale: AMPAC 16/24 47.12 % impaired   I attest that I have reviewed the above information.  Signed: Gloris Ham,  PT  Filed 08/08/2017

## 2017-08-08 NOTE — Unmapped (Addendum)
Care Management  Initial Transition Planning Assessment              General  Care Manager assessed the patient by : In person interview with patient  Orientation Level: Oriented X4    Contact/Decision Maker:    Contact Details  Contact Details: Primary Contact, Secondary Contact  Primary Contact Name: Reed Pandy  Phone #1: [phone]  Secondary Contact Name: Baird Cancer  Phone #3: [phone]    Advance Directive (Medical Treatment)  Does patient have an advance directive covering medical treatment?: Patient would not like information., Patient does not have advance directive covering medical treatment.  Surrogate decision maker appointed:: No  Information provided on advance directive:: No  Patient requests assistance:: No    Patient Information:    Lives with: Spouse/significant other    Type of Residence: Private residence (3steps to enter)   Support Systems: Spouse (Wife Luster Landsberg [phone])    Responsibilities/Dependents at home?: No    Home Care services in place prior to admission?: No     Equipment Currently Used at Home: cane, straight       Currently receiving outpatient dialysis?: No       Financial Information:     Patient source of income:  (SS)    Need for financial assistance?:  (Has [employer] Medicare Adv)       Discharge Needs Assessment:    Concerns to be Addressed: adjustment to diagnosis/illness, basic needs    Clinical Risk Factors: New Diagnosis, Principal Diagnosis: Cancer, Stroke, COPD, Heart Failure, AMI, Pneumonia, Joint Replacment, > 65, Multiple Diagnoses (Chronic)    Barriers to taking medications: No  Equipment Needed After Discharge:  (TBD)    Discharge Facility/Level of Care Needs:      Patient at risk for readmission?: Yes    Discharge Plan:  Expected Discharge Date: 08/10/17    Expected Transfer from Critical Care:      Patient and/or family were provided with choice of facilities / services that are available and appropriate to meet post hospital care needs?: Yes List choices in order highest to lowest preferred, if applicable. :  (No Preference)    Initial Assessment complete?: Yes   Type of Residence: Mailing Address:  [address]  [place] [address]  Contacts: Accompanied by: Alone  Password: Tilford Pillar Details: Primary Contact, Secondary Contact  Primary Contact Name: Reed Pandy  Phone #1: [phone]  Secondary Contact Name: Baird Cancer  Phone #3: [phone]  Patient Phone Number: [phone]        Medical Provider(s): [facility]  Reason for Admission: Admitting Diagnosis:  No admission diagnoses are documented for this encounter.  Past Medical History:   has a past medical history of Calculus of kidney; Hypertension; and Malignant neoplasm of prostate (CMS-HCC).  Past Surgical History:   has a past surgical history that includes Ureteroscopy and pr upgi endoscopy,fn needle bx,guided (N/A, 02/11/2017).   Previous admit date: 02/17/2017    Primary Insurance- Payor: [employer] MEDICARE ADV / Plan: [employer] MEDICARE ADV PPO / Product Type: *No Product type* /   Secondary Insurance ??? None  Prescription Coverage ???   Preferred Pharmacy - [id number] - [place], [place] - [address] AT [facility] [address]  [facility] - [place] ([place]), [place] - [address] [place]  [facility] - [place], [place] - [address]  [facility] - [place], [place] -  [address]    Transportation home: Private vehicle  Level of function prior to admission: Requires Assistance uses cane  Patient gave  CM permission to talk with his wife.

## 2017-08-08 NOTE — Unmapped (Signed)
Radiation Oncology Initial Visit Note    Encounter Date: 08/05/2017  Patient Name: Stephen Bartlett  Date of Birth: 09/02/1940  ZOX:[id number]  Patient Age: 76 y.o.     Primary Care Provider:   [facility]    Assessment:  Stephen Bartlett is a 76 y.o. male with long history of metastatic prostate cancer currently on Abiraterone with Lupron newly presenting with cord compression at T3-4 s/p T3-5 thoracic decompression. He also has a low-grade pancreatic neuroendocrine tumor under observation.    Recommendations:  After reviewing his case, we believe that he would benefit from post-op radiation to his thoracic spine in the T2-T6 region (to encompass the T3-T5 regions of initial vertebral disease) for local control. Conventionally, this would be treated to 30 Gy in 10 fractions, but alternative fractionation schemes are also possible, with final dose to be determined at the time of treatment planning. It is preferred to begin treatment 2-4 weeks post-op and we will bring him back for a CT simulation in ~2 weeks to begin the radiation planning process. At the time of CT simulation, we will also re-explore if any other sites of metastatic disease are present and also in need of treatment.    We reviewed with the patient the benefits and potential risks of radiation therapy including, but not limited to, possible fatigue, nausea, sore throat, esophagitis and skin irritation within the treatment field. After extensive discussion, all of patient's questions were answered, consent was obtained, and he is in agreement with the plan. We will arrange a follow-up and CT simulation in ~2 weeks. Our contact information has been provided in case any additional questions arise in the meantime.     ----- Thank you for this interesting consult. Case has been discussed with the attending Dr. Imogene Burn ------    -----------------------------------------------------------      History of Present Illness:    2013: Presented with metastatic prostate cancer with a right acetabular lesion and suspicious vertebral lesions (L1, T4, and T5) (presented with PSA in the 900s). Treated with Lupron and eventually changed to Avodart and Casodex (due to breast tenderness and decreased libido).     2014: Restarted on Lupron and Casodex    11/08/2014: Bone scan with decreased intensity in right pelvis and upper T-spine    10/2016: Switched to Abiraterone with Lupron (due to rising PSA).    01/07/2017: Surveillance CT abdomen/pelvis revealed a 1.7 cm lesion in the body of the pancreas concerning for neuroendocrine tumor.   -- Bone scan with increased uptake in the thoracic spine without new sites of activity    02/11/2017: FNA revealed low-grade neuroendocrine tumor. Plan for observance.    08/05/2017: Presented to [facility] after a fall due to leg weakness with MRI spine revealing an expansile mass involving the T3-5 vertebral bodies causing cord compression at T3-4.    08/06/2017: Seen by orthopedic surgery and taken to the OR for T3-5 thoracic decompression he also had a posterior cervical and thoracic fusion from C5-T8.     Overall he is doing well today. He is still sore from the surgery but reports that his leg strength has improved. He denies new weakness, sensory disturbance, bowel/bladder incontinence. He lives in [place] but prefers treatment here.     Prior Radiation Therapy: No  Pacemaker: No  Pregnancy status: No; male patient    Review of Systems:  All others negative except for any pertinent positives noted in the HPI     PAST MEDICAL HISTORY:  Past  Medical History:   Diagnosis Date   ??? Calculus of kidney    ??? Hypertension    ??? Malignant neoplasm of prostate (CMS-HCC)        FAMILY HISTORY:  Family History   Problem Relation Age of Onset   ??? Hypertension Mother    ??? Stroke Mother    ??? Prostate cancer Other    ??? GU problems Neg Hx    ??? Kidney cancer Neg Hx    ??? Urolithiasis Neg Hx        SOCIAL HISTORY:   Social History     Social History   ??? Marital status: Married     Spouse name: N/A   ??? Number of children: N/A   ??? Years of education: N/A     Occupational History   ??? Not on file.     Social History Main Topics   ??? Smoking status: Never Smoker   ??? Smokeless tobacco: Never Used   ??? Alcohol use 0.6 oz/week     1 Cans of beer per week      Comment: now and then   ??? Drug use: Yes     Types: Marijuana      Comment: Every now and then for pain. per 01/31/17 report.    ??? Sexual activity: Not on file     Other Topics Concern   ??? Not on file     Social History Narrative   ??? No narrative on file       No Known Allergies    Current Medications:  Current Facility-Administered Medications   Medication Dose Route Frequency Provider Last Rate Last Dose   ??? abiraterone (ZYTIGA) tablet 1,000 mg **patient supplied**  1,000 mg Oral Daily Jacklynn Barnacle, MD   1,000 mg at 08/08/17 0945   ??? acetaminophen (TYLENOL) tablet 650 mg  650 mg Oral Q6H Wyonia Hough, MD   650 mg at 08/08/17 1121   ??? ceFAZolin (ANCEF) IVPB 2 g in 50 ml dextrose (premix)  2 g Intravenous Q8H Jacklynn Barnacle, MD 100 mL/hr at 08/08/17 1249 2 g at 08/08/17 1249   ??? docusate sodium (COLACE) capsule 100 mg  100 mg Oral Daily Wyonia Hough, MD   100 mg at 08/08/17 0935   ??? influenza vaccine quad syringe (FLULAVAL) (6 MOS & UP) (PF) 2018-19  0.5 mL Intramuscular During hospitalization Katherina Mires, MD       ??? MORPhine 4 mg/mL injection 2 mg  2 mg Intravenous Q4H PRN Jacklynn Barnacle, MD   2 mg at 08/08/17 0004   ??? naloxone (NARCAN) injection 0.1 mg  0.1 mg Intravenous Q5 Min PRN Jacklynn Barnacle, MD       ??? ondansetron (ZOFRAN-ODT) disintegrating tablet 4 mg  4 mg Oral Q6H PRN Wyonia Hough, MD       ??? oxyCODONE (ROXICODONE) immediate release tablet 5 mg  5 mg Oral Q4H PRN Wyonia Hough, MD   5 mg at 08/08/17 0532   ??? pneumococcal polysacchride (23-valps) (PNEUMOVAX) vaccine 0.5 mL  0.5 mL Subcutaneous During hospitalization Katherina Mires, MD       ??? polyethylene glycol (MIRALAX) packet 17 g  17 g Oral Daily Wyonia Hough, MD   17 g at 08/08/17 0935   ??? predniSONE (DELTASONE) tablet 5 mg  5 mg Oral BID Jacklynn Barnacle, MD   5 mg at 08/08/17 0532  Physical Exam:   Vitals:    08/08/17 0435   BP: 154/84   Pulse: 79   Resp: 16   Temp: 36.7 ??C   SpO2: 97%     General: Well developed, well nourished, no acute distress  Eyes: Extra occular movements intact.   EARS/Nose/Mouth/Throat: Moist mucous membranes.   Cardio: Normal rate.  Respiratory: Normal work of breathing.  Gastrointestinal: Abdomen non-distended.  Neuro: Alert and oriented.   MSK/Extremities:  No clubbing cyanosis, or edema.  Psychiatric:  Normal mood and affect.  Converses clearly and emotionally appropriate.       Imaging, Labs, and Pathology:  We have personally reviewed the imaging studies as detailed above.       Augustine Radar, M.D. M.S.  PGY4, Radiation Oncology.  [facility]  [id number]  [place] [address]  Pager: [phone]    ATTENDING NOTE:  I discussed this patient with Dr Sabino Gasser, but did not see this patient.  Will do formal consult when patient returns in outpatient visit, and will assess surgical healing and readiness for RT at that point.

## 2017-08-09 LAB — CBC
HEMATOCRIT: 26.5 % — ABNORMAL LOW (ref 41.0–53.0)
HEMOGLOBIN: 8.8 g/dL — ABNORMAL LOW (ref 13.5–17.5)
MEAN CORPUSCULAR HEMOGLOBIN CONC: 33.3 g/dL (ref 31.0–37.0)
MEAN CORPUSCULAR VOLUME: 92.5 fL (ref 80.0–100.0)
PLATELET COUNT: 184 10*9/L (ref 150–440)
RED BLOOD CELL COUNT: 2.86 10*12/L — ABNORMAL LOW (ref 4.50–5.90)
RED CELL DISTRIBUTION WIDTH: 14 % (ref 12.0–15.0)
WBC ADJUSTED: 9.2 10*9/L (ref 4.5–11.0)

## 2017-08-09 LAB — PHOSPHORUS: Phosphate:MCnc:Pt:Ser/Plas:Qn:: 2.4 — ABNORMAL LOW

## 2017-08-09 LAB — MAGNESIUM: Magnesium:MCnc:Pt:Ser/Plas:Qn:: 2.2

## 2017-08-09 LAB — WBC ADJUSTED: Lab: 9.2

## 2017-08-09 NOTE — Unmapped (Signed)
ORTHOPAEDIC SURGERY PROGRESS NOTE  Attending: Cameron Ali    s/p posterior cervical and thoracic decompression from T3-T5, posterior cervical and thoracic fusion from C7-T8     ASSESSMENT/PLAN:  Stephen Bartlett is a 76 y.o. male doing well postoperatively.     -Activities as tolerated  -To floor today  -Prednisone 5mg  BID per onc recs  -Ancef until drains are out   -Drains to bulb suction  -HOB as tolerated  -Q4 hour neuro checks  -Dressing: Keep intact, change with drain pull  -DVT Ppx: Will discuss. Please hold for now.     SUBJECTIVE:  Doing well post-operatively. Sitting in chair this afternnon. Has been mobilizing. Drain output decreasing.     OBJECTIVE:  PE:    Vitals:    08/08/17 1800   BP: 152/78   Pulse: 82   Resp: 17   Temp: 36 ??C   SpO2: 97%     Alert and oriented.   RLE:   5-/5 TA, 4/5 EHL, 5-/5 GS, 4/5 quad, 4/5 IP  LLE:  5-/5 TA, 5-/5 EHL, 5-/5 GS, 4/5 quad, 4/5 IP  SILT: L3-S1    * Please page Clement Sayres, NP [phone] with any questions while patient in inpatient. Please contact the resident who leaves daily progress notes for any questions between 3-5PM and then Page orthopaedic consult pager [phone]) on nights (after 5PM) and weekends    - Current orthopaedic contact resident: Warren Danes [phone]  - Current orthopaedic care attending: Cavanaugh  - On nights (6pm-6am), weekends, and holidays, please page Orthopaedic Consult pager.

## 2017-08-09 NOTE — Unmapped (Signed)
S/p trimming and filing pt's toenails. His great nails were very thick with fungus and I soaked his feet in soapy water x 15 min to soften them enough to partially trim them. I suggested to pt that he should see a podiatrist after discharge to manage his great nails. Afterwards I washed, rinsed and applied lotion to his feet and replaced his socks. Pt tol well.

## 2017-08-09 NOTE — Unmapped (Signed)
ORTHOPAEDIC SURGERY PROGRESS NOTE  Attending: Cameron Ali    s/p posterior cervical and thoracic decompression from T3-T5, posterior cervical and thoracic fusion from C7-T8     ASSESSMENT/PLAN:  Stephen Bartlett is a 76 y.o. male doing well postoperatively.     -Activities as tolerated  -To floor today  -Prednisone 5mg  BID per onc recs  -Ancef until drains are out   -Drains to bulb suction  -HOB as tolerated  -Q4 hour neuro checks  -Dressing: Keep intact, change with drain pull  -DVT Ppx: Will discuss. Please hold for now.     SUBJECTIVE:  Doing well post-operatively. Sitting in chair this afternnon. Has been mobilizing. Drain output decreasing.     OBJECTIVE:  PE:    Vitals:    08/09/17 0539   BP: 183/88   Pulse: 80   Resp: 17   Temp: 35.7 ??C   SpO2: 98%     Alert and oriented.   RLE:   5-/5 TA, 4/5 EHL, 5-/5 GS, 4/5 quad, 4/5 IP  LLE:  5-/5 TA, 5-/5 EHL, 5-/5 GS, 4/5 quad, 4/5 IP  SILT: L3-S1    * Please page Clement Sayres, NP [phone] with any questions while patient in inpatient. Please contact the resident who leaves daily progress notes for any questions between 3-5PM and then Page orthopaedic consult pager [phone]) on nights (after 5PM) and weekends    - Current orthopaedic contact resident: Warren Danes [phone]  - Current orthopaedic care attending: Cavanaugh  - On nights (6pm-6am), weekends, and holidays, please page Orthopaedic Consult pager.

## 2017-08-09 NOTE — Unmapped (Signed)
Problem: Patient Care Overview  Goal: Plan of Care Review  Outcome: Progressing  Went to x-ray this AM for pictures of the spine. Back pain reportedwith movement, but no pain at rest. Ambulated with walker and NA assistance, mentioned he felt stronger as the day progressed.

## 2017-08-09 NOTE — Unmapped (Signed)
Problem: Patient Care Overview  Goal: Plan of Care Review  Outcome: Progressing  No acute events this shift, pt. Progressing appropriately. A/O x4. VSS. O2 sats maintained on RA. PRN pain medication given for surgical pain. Pt. States pain is a little better than yesterday but still rough. Surgical dressings remain clean, dry, and intact after being changed from previous shift. PIV maintained with MIVF and antibiotics. Adequate intake and output through shift. Tolerating diet well, foley catheter remains in place until d/c Urine a dark yellow color. Pt. Able to turn self better after ambulating during shift. All safety measures in place, will continue to monitor closely.

## 2017-08-09 NOTE — Unmapped (Signed)
Hospitalized-moving specialty refill reminder call to appropriate date and removed call attempt data.

## 2017-08-10 LAB — CBC
HEMATOCRIT: 26.7 % — ABNORMAL LOW (ref 41.0–53.0)
HEMOGLOBIN: 8.8 g/dL — ABNORMAL LOW (ref 13.5–17.5)
MEAN CORPUSCULAR HEMOGLOBIN CONC: 32.8 g/dL (ref 31.0–37.0)
MEAN CORPUSCULAR HEMOGLOBIN: 30.4 pg (ref 26.0–34.0)
MEAN CORPUSCULAR VOLUME: 92.6 fL (ref 80.0–100.0)
MEAN PLATELET VOLUME: 7.8 fL (ref 7.0–10.0)
PLATELET COUNT: 203 10*9/L (ref 150–440)
RED BLOOD CELL COUNT: 2.89 10*12/L — ABNORMAL LOW (ref 4.50–5.90)
RED CELL DISTRIBUTION WIDTH: 14.2 % (ref 12.0–15.0)
WBC ADJUSTED: 8.8 10*9/L (ref 4.5–11.0)

## 2017-08-10 LAB — MAGNESIUM: Magnesium:MCnc:Pt:Ser/Plas:Qn:: 2.1

## 2017-08-10 LAB — PHOSPHORUS: Phosphate:MCnc:Pt:Ser/Plas:Qn:: 2.9

## 2017-08-10 LAB — HEMOGLOBIN: Lab: 8.8 — ABNORMAL LOW

## 2017-08-10 NOTE — Unmapped (Signed)
Please advise 

## 2017-08-10 NOTE — Unmapped (Signed)
Problem: Patient Care Overview  Goal: Plan of Care Review  Outcome: Progressing  No acute events this shift, pt. Progressing appropriately. A/O x4. VSS. O2 sats maintained on RA. PRN pain medication given for soreness in back. Surgical dressings remain clean, dry, and intact. PIV maintained with MIVF. Adequate intake and output through shift. Pt.'s foley catheter remains in place, will stay past d/c. Pt. Ambulated from chair to bed with standby assist. Still hesitant to transfer and move without help. All safety measures in place, will continue to monitor closely.  Goal: Individualization and Mutuality  Outcome: Progressing    Goal: Discharge Needs Assessment  Outcome: Progressing    Goal: Interprofessional Rounds/Family Conf  Outcome: Progressing      Problem: Skin Injury Risk (Adult)  Goal: Identify Related Risk Factors and Signs and Symptoms  Related risk factors and signs and symptoms are identified upon initiation of Human Response Clinical Practice Guideline (CPG).   Outcome: Progressing    Goal: Skin Health and Integrity  Patient will demonstrate the desired outcomes by discharge/transition of care.   Outcome: Progressing      Problem: Fall Risk (Adult)  Goal: Identify Related Risk Factors and Signs and Symptoms  Related risk factors and signs and symptoms are identified upon initiation of Human Response Clinical Practice Guideline (CPG).   Outcome: Progressing    Goal: Absence of Fall  Patient will demonstrate the desired outcomes by discharge/transition of care.   Outcome: Progressing      Problem: Self-Care Deficit (Adult,Obstetrics,Pediatric)  Goal: Identify Related Risk Factors and Signs and Symptoms  Related risk factors and signs and symptoms are identified upon initiation of Human Response Clinical Practice Guideline (CPG).   Outcome: Progressing    Goal: Improved Ability to Perform BADL and IADL  Patient will demonstrate the desired outcomes by discharge/transition of care.   Outcome: Progressing Problem: VTE, DVT and PE (Adult)  Goal: Signs and Symptoms of Listed Potential Problems Will be Absent, Minimized or Managed (VTE, DVT and PE)  Signs and symptoms of listed potential problems will be absent, minimized or managed by discharge/transition of care (reference VTE, DVT and PE (Adult) CPG).   Outcome: Progressing

## 2017-08-10 NOTE — Unmapped (Signed)
Problem: Patient Care Overview  Goal: Plan of Care Review  Outcome: Progressing  VSS, A&Ox4. O2 sats maintained on RA. Island dressing on back & mid neck c/d/i w/ 2 JPs to back putting out moderate amount of serosanguineous drainage. Foley remains c/d/i w/ adequate UOP. Pt receiving oxy & IV morphine q4 PRN for pain. Up in chair for most of shift. No falls or injuries. Skin intact. All safety measures in place, will continue to monitor.   Goal: Individualization and Mutuality  Outcome: Progressing    Goal: Discharge Needs Assessment  Outcome: Progressing    Goal: Interprofessional Rounds/Family Conf  Outcome: Progressing      Problem: Skin Injury Risk (Adult)  Goal: Identify Related Risk Factors and Signs and Symptoms  Related risk factors and signs and symptoms are identified upon initiation of Human Response Clinical Practice Guideline (CPG).   Outcome: Progressing    Goal: Skin Health and Integrity  Patient will demonstrate the desired outcomes by discharge/transition of care.   Outcome: Progressing      Problem: Fall Risk (Adult)  Goal: Identify Related Risk Factors and Signs and Symptoms  Related risk factors and signs and symptoms are identified upon initiation of Human Response Clinical Practice Guideline (CPG).   Outcome: Progressing    Goal: Absence of Fall  Patient will demonstrate the desired outcomes by discharge/transition of care.   Outcome: Progressing      Problem: Self-Care Deficit (Adult,Obstetrics,Pediatric)  Goal: Identify Related Risk Factors and Signs and Symptoms  Related risk factors and signs and symptoms are identified upon initiation of Human Response Clinical Practice Guideline (CPG).   Outcome: Progressing    Goal: Improved Ability to Perform BADL and IADL  Patient will demonstrate the desired outcomes by discharge/transition of care.   Outcome: Progressing      Problem: VTE, DVT and PE (Adult)  Goal: Signs and Symptoms of Listed Potential Problems Will be Absent, Minimized or Managed (VTE, DVT and PE)  Signs and symptoms of listed potential problems will be absent, minimized or managed by discharge/transition of care (reference VTE, DVT and PE (Adult) CPG).   Outcome: Progressing

## 2017-08-10 NOTE — Unmapped (Signed)
Oncology Consult Note    Requesting Attending Physician :  Timothy Lasso, MD  Service Requesting Consult : Orthopedics [facility])    Assessment/Recommendations:    Metastatic Prostate Cancer with new compressive T3-T4 lesion s/p decompression and fusion: 76y/o male who has known diagnosis of metastatic prostate cancer (presenting PSA 900) since 2016, he has had slowly rising PSA in the outpatient setting since 01/2017 (19 ->34). His current management consists of Abiraterone/Prednisone and 3 monthly Lupron (last dose 05/19/17).  The patient states he has been compliant with his current therapy. 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 His primary oncologist has previously had discussion of changing therapy to Docetaxel or Radium-223, however this will be deferred to the outpatient setting once he has recovered from his spinal fusion surgery.    Plan:  1) We will follow up path from his surgery performed during this amission  2) Please continue Abiraterone 1000mg  once daily and Prednisone 5mg  BID to prevent disease flare  3) Please get nuclear medicine bone scan to help stage his disease  4) Appreciate outpatient Radiation oncology follow up for palliative XRT to the spine  5) We will coordinate his outpatient follow up for his Lupron (due 08/18/2017) and discussions on alternate systemic agents probably Radium-223.    The patient was seen by and discussed with Dr. [name].    Benay Pike, MD, MS  Hematology and Oncology Fellow        ========================================================================    Reason for Consult:   This patient is being seen in consultation at the request of Dr. Cameron Ali for the management of metastatic prostate cancer admitted with new T3-T4 lesion requiring spinal fusion surgery.    Interval History:  Slowly improving mobility. Sitting out in the chair, appetite improving. No bowel movements as yet, has been on opiates with increasing bowel regimen.    Objective: :  Patient Vitals for the past 8 hrs:   BP Temp Temp src Pulse Resp SpO2   08/10/17 0451 143/76 36.5 ??C (97.7 ??F) Oral 85 16 97 %     No intake/output data recorded.    Physical Exam:  GENERAL: Appears stated age. Sitting out in chair - NAD  HEENT: Pupils equal, round, and reactive to light. No cervical or supraclav LN  HEART: Quiet heart sounds  CHEST/LUNG: CTA anteriorly  ABDOMEN: Soft, nontender, nondistended. No hepatosplenomegaly.   EXTREMITIES: No edema  SKIN: No rash or petechiae.   NEURO EXAM: Power and sensation preserved lower limb      LABORATORY RESULTS:     CBC -   Results in Past 2 Days  Result Component Current Result   WBC 8.8 (08/10/2017)   RBC 2.89 (L) (08/10/2017)   HCT 26.7 (L) (08/10/2017)   HGB 8.8 (L) (08/10/2017)   Platelet 203 (08/10/2017)   RDW 14.2 (08/10/2017)   MCV 92.6 (08/10/2017)   MCH 30.4 (08/10/2017)   MCHC 32.8 (08/10/2017)   MPV 7.8 (08/10/2017)     BMP -   No results found for requested labs within last 2 days.     Coagulation -   No results found for requested labs within last 2 days.     LFT's -   No results found for requested labs within last 2 days.       IMAGING:  Metastasis involving the T3, T4,  and T5 vertebral bodies with epidural extension at the T3-T4 levels causing severe spinal canal narrowing and compression of the spinal cord with resulting edema within the cord.    -Severe narrowing of the bilateral T3-4 and T4-5 neural foramina related to the tumor.      PATHOLOGY:  02/11/17  A: Pancreas, fine needle biopsy  - Neuroendocrine tumor grade 1    PSA - 08/08/17 - 80  Testosterone - 13??          PSA   Date Value Ref Range Status   05/19/2017 34.70 (H) 0.00 - 4.00 ng/mL Final   02/10/2017 30.50 (H) 0.00 - 4.00 ng/mL Final   01/20/2017 19.60 (H) 0.00 - 4.00 ng/mL Final 01/07/2017 18.70 (H) 0.00 - 4.00 ng/mL Final   12/22/2016 19.40 (H) 0.00 - 4.00 ng/mL Final   12/03/2016 31.70 (H) 0.00 - 4.00 ng/mL Final   11/04/2016 30.30 (H) 0.00 - 4.00 ng/mL Final   08/06/2016 18.80 (H) 0.00 - 4.00 ng/mL Final   05/06/2016 24.10 (H) 0.00 - 4.00 ng/mL Final   01/29/2016 12.50 (H) 0.00 - 4.00 ng/mL Final   10/30/2015 11.10 (H) 0.00 - 4.00 ng/mL Final   07/24/2015 7.41 (H) 0.00 - 4.00 ng/mL Final   04/03/2015 3.44 0.00 - 4.00 ng/mL Final   12/23/2014 12.50 (H) 0.00 - 4.00 ng/mL Final

## 2017-08-10 NOTE — Unmapped (Signed)
ORTHOPAEDIC SURGERY PROGRESS NOTE  Attending: Cameron Ali    s/p posterior cervical and thoracic decompression from T3-T5, posterior cervical and thoracic fusion from C7-T8     ASSESSMENT/PLAN:  Stephen Bartlett is a 76 y.o. male doing well postoperatively.     -Activities as tolerated  -Prednisone 5mg  BID per onc recs  -Ancef until drains are out   - Will pull drains when output decreaseed  -Drains to bulb suction  -HOB as tolerated  -Q4 hour neuro checks  -Dressing: Keep intact, change with drain pull  -DVT Ppx: Will discuss. Please hold for now.     SUBJECTIVE:  Doing well post-operatively. Sitting in chair this afternnon. Has been mobilizing. Did not work with PT yesterday. Drain output decreasing.     OBJECTIVE:  PE:    Vitals:    08/10/17 0451   BP: 143/76   Pulse: 85   Resp: 16   Temp: 36.5 ??C   SpO2: 97%     Alert and oriented.   RLE:   5-/5 TA, 4/5 EHL, 5-/5 GS, 4/5 quad, 4/5 IP  LLE:  5-/5 TA, 5-/5 EHL, 5-/5 GS, 4/5 quad, 4/5 IP  SILT: L3-S1    * Please page Clement Sayres, NP [phone] with any questions while patient in inpatient. Please contact the resident who leaves daily progress notes for any questions between 3-5PM and then Page orthopaedic consult pager [phone]) on nights (after 5PM) and weekends    - Current orthopaedic contact resident: Rodell Perna [phone]  - Current orthopaedic care attending: Cavanaugh  - On nights (6pm-6am), weekends, and holidays, please page Orthopaedic Consult pager.

## 2017-08-10 NOTE — Unmapped (Signed)
Please schedule follow up with Dr. Cameron Ali in two weeks for post op wound check.     Will need upright XR C and T spine prior.

## 2017-08-11 DIAGNOSIS — G9519 Other vascular myelopathies: Principal | ICD-10-CM

## 2017-08-11 LAB — CBC
HEMATOCRIT: 27 % — ABNORMAL LOW (ref 41.0–53.0)
HEMOGLOBIN: 8.8 g/dL — ABNORMAL LOW (ref 13.5–17.5)
MEAN CORPUSCULAR HEMOGLOBIN CONC: 32.5 g/dL (ref 31.0–37.0)
MEAN CORPUSCULAR HEMOGLOBIN: 30.4 pg (ref 26.0–34.0)
MEAN CORPUSCULAR VOLUME: 93.5 fL (ref 80.0–100.0)
MEAN PLATELET VOLUME: 7.9 fL (ref 7.0–10.0)
PLATELET COUNT: 223 10*9/L (ref 150–440)
RED BLOOD CELL COUNT: 2.89 10*12/L — ABNORMAL LOW (ref 4.50–5.90)
RED CELL DISTRIBUTION WIDTH: 14.6 % (ref 12.0–15.0)
WBC ADJUSTED: 8.5 10*9/L (ref 4.5–11.0)

## 2017-08-11 LAB — HEMATOCRIT: Lab: 27 — ABNORMAL LOW

## 2017-08-11 LAB — PHOSPHORUS: Phosphate:MCnc:Pt:Ser/Plas:Qn:: 3.2

## 2017-08-11 LAB — MAGNESIUM: Magnesium:MCnc:Pt:Ser/Plas:Qn:: 2.1

## 2017-08-11 NOTE — Unmapped (Signed)
Problem: Patient Care Overview  Goal: Plan of Care Review  Outcome: Progressing  Pt plan of care discussed, d/c transportation cancelled for SNF today per case manager, Selena Batten. Pt indwelling foley cath hematuria reported to MD, order for d/c foley clarified by MD, Clement Sayres, made aware and d/c foley cath, aware of hematuria. Followed by urology for TOV per MD.

## 2017-08-11 NOTE — Unmapped (Signed)
Problem: Patient Care Overview  Goal: Plan of Care Review  Outcome: Progressing  Pt plan of care discussed, pt verbalizes understanding. OOB to stretcher with assist 2, to nuclear scan. Foley cath removed, voids and incontinent, MD aware pt having hematuria, no clots noted. Taking fluids well.

## 2017-08-11 NOTE — Unmapped (Signed)
Problem: Patient Care Overview  Goal: Plan of Care Review  Outcome: Progressing  No acute events overnight, all safety measures in place.pain managed with scheduled tylenol.foley with adequate urine.patient turns self with assist. No falls or injury during the shift. No signs of skin breakdown noted.Will continue to monitor.  Goal: Individualization and Mutuality  Outcome: Progressing    Goal: Discharge Needs Assessment  Outcome: Progressing    Goal: Interprofessional Rounds/Family Conf  Outcome: Progressing      Problem: Skin Injury Risk (Adult)  Goal: Identify Related Risk Factors and Signs and Symptoms  Related risk factors and signs and symptoms are identified upon initiation of Human Response Clinical Practice Guideline (CPG).   Outcome: Progressing    Goal: Skin Health and Integrity  Patient will demonstrate the desired outcomes by discharge/transition of care.   Outcome: Progressing      Problem: Fall Risk (Adult)  Goal: Identify Related Risk Factors and Signs and Symptoms  Related risk factors and signs and symptoms are identified upon initiation of Human Response Clinical Practice Guideline (CPG).   Outcome: Progressing    Goal: Absence of Fall  Patient will demonstrate the desired outcomes by discharge/transition of care.   Outcome: Progressing      Problem: VTE, DVT and PE (Adult)  Goal: Signs and Symptoms of Listed Potential Problems Will be Absent, Minimized or Managed (VTE, DVT and PE)  Signs and symptoms of listed potential problems will be absent, minimized or managed by discharge/transition of care (reference VTE, DVT and PE (Adult) CPG).   Outcome: Progressing

## 2017-08-11 NOTE — Unmapped (Signed)
Problem: Patient Care Overview  Goal: Plan of Care Review  Outcome: Progressing  VSS, A&Ox4. O2 sats maintained on RA. Island dressing on back & mid neck c/d/i w/ 2 JPs to back putting out moderate amount of serosanguineous drainage. Foley remains c/d/i w/ adequate UOP. Pt tolerating pain w/ scheduled tylenol, denying need for PRN pain meds. Up in chair for most of shift, ambulated in hallway several times today w/ RN/NA. No falls or injuries. Skin intact. All safety measures in place, will continue to monitor.   Goal: Individualization and Mutuality  Outcome: Progressing    Goal: Discharge Needs Assessment  Outcome: Progressing    Goal: Interprofessional Rounds/Family Conf  Outcome: Progressing      Problem: Skin Injury Risk (Adult)  Goal: Identify Related Risk Factors and Signs and Symptoms  Related risk factors and signs and symptoms are identified upon initiation of Human Response Clinical Practice Guideline (CPG).   Outcome: Progressing    Goal: Skin Health and Integrity  Patient will demonstrate the desired outcomes by discharge/transition of care.   Outcome: Progressing      Problem: Fall Risk (Adult)  Goal: Identify Related Risk Factors and Signs and Symptoms  Related risk factors and signs and symptoms are identified upon initiation of Human Response Clinical Practice Guideline (CPG).   Outcome: Progressing    Goal: Absence of Fall  Patient will demonstrate the desired outcomes by discharge/transition of care.   Outcome: Progressing      Problem: Self-Care Deficit (Adult,Obstetrics,Pediatric)  Goal: Identify Related Risk Factors and Signs and Symptoms  Related risk factors and signs and symptoms are identified upon initiation of Human Response Clinical Practice Guideline (CPG).   Outcome: Progressing    Goal: Improved Ability to Perform BADL and IADL  Patient will demonstrate the desired outcomes by discharge/transition of care.   Outcome: Progressing      Problem: VTE, DVT and PE (Adult)  Goal: Signs and Symptoms of Listed Potential Problems Will be Absent, Minimized or Managed (VTE, DVT and PE)  Signs and symptoms of listed potential problems will be absent, minimized or managed by discharge/transition of care (reference VTE, DVT and PE (Adult) CPG).   Outcome: Progressing

## 2017-08-11 NOTE — Unmapped (Signed)
ORTHOPAEDIC SURGERY PROGRESS NOTE  Attending: Cameron Ali    s/p posterior cervical and thoracic decompression from T3-T5, posterior cervical and thoracic fusion from C7-T8     ASSESSMENT/PLAN:  Stephen Bartlett is a 76 y.o. male doing well postoperatively.     -Activities as tolerated  -Prednisone 5mg  BID per onc recs  -Drains removed this morning, antibiotics discontinued.   -Foley with continued blood clots in urine output. Urology consulted. Appreciate recs.   -HOB as tolerated  -Q4 hour neuro checks  -Dressing: change prn  -DVT Ppx: Will discuss. Please hold for now.     SUBJECTIVE:  Doing well post-operatively. Sitting in chair this afternnon. Has been mobilizing. Worked well with PT yesterday. Drain output decreasing. Hoping to discharge home     OBJECTIVE:  PE:    Vitals:    08/11/17 0457   BP: 137/72   Pulse: 84   Resp: 16   Temp: 36.7 ??C   SpO2: 100%     Alert and oriented.   RLE:   5-/5 TA, 4/5 EHL, 5-/5 GS, 4/5 quad, 4/5 IP  LLE:  5-/5 TA, 5-/5 EHL, 5-/5 GS, 4/5 quad, 4/5 IP  SILT: L3-S1    * Please page Clement Sayres, NP [phone] with any questions while patient in inpatient. Please contact the resident who leaves daily progress notes for any questions between 3-5PM and then Page orthopaedic consult pager [phone]) on nights (after 5PM) and weekends    - Current orthopaedic contact resident: Rodell Perna [phone]  - Current orthopaedic care attending: Cavanaugh  - On nights (6pm-6am), weekends, and holidays, please page Orthopaedic Consult pager.

## 2017-08-12 LAB — CBC
HEMATOCRIT: 26.4 % — ABNORMAL LOW (ref 41.0–53.0)
MEAN CORPUSCULAR HEMOGLOBIN CONC: 32.5 g/dL (ref 31.0–37.0)
MEAN CORPUSCULAR HEMOGLOBIN: 30.3 pg (ref 26.0–34.0)
MEAN CORPUSCULAR VOLUME: 93.5 fL (ref 80.0–100.0)
MEAN PLATELET VOLUME: 7.5 fL (ref 7.0–10.0)
PLATELET COUNT: 246 10*9/L (ref 150–440)
RED BLOOD CELL COUNT: 2.82 10*12/L — ABNORMAL LOW (ref 4.50–5.90)
WBC ADJUSTED: 8.6 10*9/L (ref 4.5–11.0)

## 2017-08-12 LAB — MAGNESIUM: Magnesium:MCnc:Pt:Ser/Plas:Qn:: 2.1

## 2017-08-12 LAB — PHOSPHORUS: Phosphate:MCnc:Pt:Ser/Plas:Qn:: 3.3

## 2017-08-12 LAB — PLATELET COUNT: Lab: 246

## 2017-08-12 MED ORDER — DOCUSATE SODIUM 100 MG CAPSULE
ORAL_CAPSULE | Freq: Every day | ORAL | 0 refills | 0.00000 days | Status: CP
Start: 2017-08-12 — End: 2017-09-11

## 2017-08-12 MED ORDER — ABIRATERONE 250 MG TABLET
ORAL_TABLET | Freq: Every day | ORAL | 0 refills | 0 days | Status: CP
Start: 2017-08-12 — End: 2017-09-11

## 2017-08-12 MED ORDER — POLYETHYLENE GLYCOL 3350 17 GRAM ORAL POWDER PACKET
PACK | Freq: Every day | ORAL | 0 refills | 0.00000 days | Status: CP
Start: 2017-08-12 — End: 2017-08-15

## 2017-08-12 MED ORDER — ENOXAPARIN 40 MG/0.4 ML SUBCUTANEOUS SYRINGE: 40 mg | mL | 0 refills | 0 days | Status: AC

## 2017-08-12 MED ORDER — OXYCODONE 5 MG TABLET
ORAL_TABLET | ORAL | 0 refills | 0.00000 days | Status: CP | PRN
Start: 2017-08-12 — End: 2017-08-17

## 2017-08-12 MED ORDER — PREDNISONE 5 MG TABLET
ORAL_TABLET | Freq: Two times a day (BID) | ORAL | 0 refills | 0.00000 days | Status: CP
Start: 2017-08-12 — End: 2017-12-15

## 2017-08-12 MED ORDER — ONDANSETRON 4 MG DISINTEGRATING TABLET
ORAL_TABLET | Freq: Four times a day (QID) | ORAL | 0 refills | 0.00000 days | Status: CP | PRN
Start: 2017-08-12 — End: 2017-09-11

## 2017-08-12 NOTE — Unmapped (Signed)
[facility]   [address] [place], [place] [address]  [phone]

## 2017-08-12 NOTE — Unmapped (Signed)
Discharge Summary    Admit date: 08/05/2017    Discharge date and time: 08/12/2017    Discharge to:  Skilled nursing facility    Discharge Service: Orthopedics [facility])    Discharge Attending Physician: Timothy Lasso, MD    Discharge  Diagnoses: Metastatic cancer to spine    Secondary Diagnosis: Active Problems:    * No active hospital problems. *      OR Procedures:  C5-T8 PLF, T3-T5 PLD     Ancillary Procedures: no procedures    Discharge Day Services:    Subjective   No acute events overnight.    Objective  Patient Vitals for the past 8 hrs:   BP Temp Temp src Pulse Resp SpO2   08/12/17 0501 112/70 36 ??C Oral 80 16 98 %     I/O this shift:  In: 200 [P.O.:200]  Out: 150 [Urine:150]    Physical Exam:  General:  Alert and oriented, No acute distress  Extremity/wound location: Posterior Cervical and thoracic spine   Incision: dressing clean, dry, and intact   Neurovascular: Distal affected extremity is well perfused. Intact sensation to the affected hand in the median, ulnar, and radial nerve distribution. Intact sensation to the affected foot in the superficial and deep peroneal, sural, saphenous and tibial nerve distribution.    Hospital Course: Patient presented to the hospital on 12/14 with worsening back pain. Additionally the patient noted that he was having balance difficulties ad was falling frequently.New compressive lesion was identified in the T3-4 interspace and in the setting of the patients history of metastatic prostate cancer this was thought to be a metastases. The patient underwent a C5-T8 PLF, T3-T5 PLD with no complications on 12/15. He has done well since. His drains have been removed. His weakness and back pain are improving subjectiviely. His foley was removed on 09-09-23 and he has passed his voiding trials.    Hemo-onc has followed patient and will arrange for outpatient XRT.         Condition at Discharge: Improved  Discharge Medications:      Your Medication List      START taking these medications    docusate sodium 100 MG capsule  Commonly known as:  COLACE  Take 1 capsule (100 mg total) by mouth daily.     enoxaparin 40 mg/0.4 mL Syrg  Commonly known as:  LOVENOX  Inject 0.4 mL (40 mg total) under the skin daily. for 17 days  Start taking on:  08/13/2017     ondansetron 4 MG disintegrating tablet  Commonly known as:  ZOFRAN-ODT  Take 1 tablet (4 mg total) by mouth every six (6) hours as needed.     oxyCODONE 5 MG immediate release tablet  Commonly known as:  ROXICODONE  Take 1 tablet (5 mg total) by mouth every four (4) hours as needed. for up to 5 days     polyethylene glycol 17 gram packet  Commonly known as:  MIRALAX  Take 17 g by mouth daily. for 3 days        CONTINUE taking these medications    abiraterone 250 mg Tab tablet  Commonly known as:  ZYTIGA  Take 4 tablets (1,000 mg total) by mouth daily.     allopurinol 100 MG tablet  Commonly known as:  ZYLOPRIM  TAKE 1 TABLET EVERY DAY     amLODIPine 5 MG tablet  Commonly known as:  NORVASC  Take 5 mg by mouth daily.     atorvastatin  10 MG tablet  Commonly known as:  LIPITOR  Take 10 mg by mouth nightly.     bicalutamide 50 MG tablet  Commonly known as:  CASODEX  TAKE 1 TABLET EVERY DAY     predniSONE 5 MG tablet  Commonly known as:  DELTASONE  Take 1 tablet (5 mg total) by mouth Two (2) times a day.     tamsulosin 0.4 mg capsule  Commonly known as:  FLOMAX  TAKE 1 CAPSULE EVERY DAY            Pending Test Results:     Diet: Regular Diet- Advance as tolerated     Discharge Instructions:       [place] DEPARTMENT OF ORTHOPAEDICS - SPINE SURGERY    Dr. Frederik Pear                                DISCHARGE INSTRUCTIONS  Thank you for entrusting your care to [place].  We would like to make your healing process as rapid as possible.     Always CALL us first and we will call you back.    CONTACT NUMBER:  [phone] - Marita Kansas, RN    NOTE: after 4 PM, or on weekends or holidays, please contact the Hospital Operator at [phone] and ask for the Orthopedic Resident on Call. It may be up to 30 minutes before you receive a call back.    FOLLOW UP APPOINTMENT  Please call Central Scheduling Office: [phone] to make your 2 week post-op visit if it has not already been scheduled.     YOU SHOULD CALL IF:  1. Have more redness and/or swelling or excessive drainage at the incision site.  2. Increasing pain despite rest and taking pain medicine.  3. A Fever of 101.5?? F or greater, sweats, or chills 2 days after surgery  4. New pain in the arms or legs, New weakness or numbness.  5. Have difficulty with urination or bowel movements, pain or numbness in the rectal, vaginal or scrotal area.  WOUND CARE  ? You should leave the surgical dressing in place as long as it stays clean and dry. If dressing becomes wet and need to be changed, then place a dry dressing on the incision once or twice a day as needed only. The drainage from incision should be decreasing. If it is not decreasing, let us know. Do NOT put any creams on the incision until cleared by your physician.   ? Some drainage in the first few days is normal. If it is slightly reddish-brown/yellow, do not be concerned.  ? It is OKAY to shower with or without any dressing at incision site when you feel comfortable. Pat the incision site dry and replace with clean, dry dressing.     YOU SHOULD CALL 911 IF you have Shortness of Breath or Chest Pain.      YOUR COLLAR  ?? You must wear the collar at all times until seen in the office for your first post-operative visit.       EXERCISE  ?? You may walk or climb stairs as tolerated.  Walking frequently after surgery will help prevent blood clots.  ?? Do NOT lift anything weighing greater than about 10 lbs.  (A gallon container of water weighs about 8.5 lbs)  ?? Try to avoid lifting or reaching above your head.     DRIVING  ?? You may NOT drive  a car until cleared by your physician.  You may not drive while wearing a neck brace.  If you have to take a long trip as a passenger, make sure to stop every 30 minutes so that you can walk around and stretch your legs.  Reclining the passenger seat can make the trip more comfortable.     For any and all questions regarding your after care, please try calling us FIRST and we will call you back.    Please note that narcotic pain medicine will not be refilled over the phone. We only refill pain medications up to 6 weeks postoperatively, and you will need an appointment for reevaluation prior to any refills.        Follow Up instructions and Outpatient Referrals     Discharge instructions       I certify that based on my evaluation of this patient, this patient requires BLS transportation services and that other forms of transport are contraindicated.  Please refer to care management transitions note for transportation details.         Discharge instructions       I certify that based on my evaluation of this patient, this patient requires BLS transportation services and that other forms of transport are contraindicated.  Please refer to care management transitions note for transportation details.             Appointments which have been scheduled for you    Aug 25, 2017  9:00 AM EST  (Arrive by 8:30 AM)  LAB ONLY [place] with ADULT ONC LAB  [facility] ADULT ONCOLOGY LAB DRAW STATION [facility] REGION) [address]  [place] [address]  [phone]   Aug 25, 2017 10:00 AM EST  (Arrive by 9:30 AM)  RETURN ACTIVE [place] with Manfred Shirts, MD  [facility] ONCOLOGY MULTIDISCIPLINARY 2ND FLR CANCER HOSP [facility] REGION) [address]  [place] [address]  [phone]   Aug 25, 2017 11:00 AM EST  (Arrive by 10:30 AM)  SIMULATION with John Giovanni, MD  [facility] RADIATION ONCOLOGY [place] [facility] REGION) [address]  [place] [address]  [phone]   Aug 29, 2017  9:00 AM EST  (Arrive by 8:30 AM)  CT ABDOMEN PELVIS W CONTRAST with [facility] CT RM 1  IMG CT [facility]) [address]  [place] [address]  [phone]   On appt date: Drink lots of water 24 hrs Bring recent lab work Take meds as usual [employer] fast streamer of current meds Bring snack if diabetic  On appt date do not: Consume anything 2 hrs  Let us know if pt: Allergic to contrast dyes Diabetic Pregnant or nursing Claustrophobic  (Title:[id number])   Aug 29, 2017 11:30 AM EST  (Arrive by 11:00 AM)  RETURN FOLLOW UP  with Hassie Bruce, MD  [facility] SURGERY ONCOLOGY [facility] REGION) [address]  [place] [address]  [phone]   Aug 29, 2017  1:30 PM EST  (Arrive by 1:00 PM)  XR THORACIC SPINE AP AND LATERAL with IC DIAG RM 1  IMG DIAG  X-RAY IMAGING CENTER [facility]) [address]  [place] [address]  [phone]   Aug 29, 2017  1:45 PM EST  (Arrive by 1:30 PM)  Danelle Earthly FOLLOW UP with Timothy Lasso, MD  [facility] SPINE CTR NEUROSURG ORTHO [address] [place] (  [facility] REGION) [address]  [place] [address]  [phone]         Resources and Referrals     [facility]   [address] [place], [place] [address]  [phone]

## 2017-08-12 NOTE — Unmapped (Signed)
Problem: Patient Care Overview  Goal: Plan of Care Review  Outcome: Progressing  Patient discharged by EMT to nursing facility. Pt ambulated to chair. Adequate U/O- incontinent at times. No bms this shift. Home chemo med sent with pt. Lovenox teaching provided, pt states his wife will do it. Tolerating regular diet. VSS on room air. PIV removed. Discharge instructions given and explained. Prescriptions put in d/c packet. Pt discharged with EMT via transport.   Goal: Individualization and Mutuality  Outcome: Progressing      Problem: Skin Injury Risk (Adult)  Goal: Identify Related Risk Factors and Signs and Symptoms  Related risk factors and signs and symptoms are identified upon initiation of Human Response Clinical Practice Guideline (CPG).   Outcome: Progressing    Goal: Skin Health and Integrity  Patient will demonstrate the desired outcomes by discharge/transition of care.   Outcome: Progressing      Problem: Fall Risk (Adult)  Goal: Identify Related Risk Factors and Signs and Symptoms  Related risk factors and signs and symptoms are identified upon initiation of Human Response Clinical Practice Guideline (CPG).   Outcome: Progressing    Goal: Absence of Fall  Patient will demonstrate the desired outcomes by discharge/transition of care.   Outcome: Progressing      Problem: Self-Care Deficit (Adult,Obstetrics,Pediatric)  Goal: Identify Related Risk Factors and Signs and Symptoms  Related risk factors and signs and symptoms are identified upon initiation of Human Response Clinical Practice Guideline (CPG).   Outcome: Progressing    Goal: Improved Ability to Perform BADL and IADL  Patient will demonstrate the desired outcomes by discharge/transition of care.   Outcome: Progressing

## 2017-08-12 NOTE — Unmapped (Signed)
Problem: Patient Care Overview  Goal: Plan of Care Review  Outcome: Progressing  No acute events overnight, all safety measures in place.voiding freely. Denies pain at this time.no falls or injury during the shift.posterior neck and back dressing with drains intact.will continue to monitor.  Goal: Individualization and Mutuality  Outcome: Progressing    Goal: Discharge Needs Assessment  Outcome: Progressing    Goal: Interprofessional Rounds/Family Conf  Outcome: Progressing      Problem: Skin Injury Risk (Adult)  Goal: Identify Related Risk Factors and Signs and Symptoms  Related risk factors and signs and symptoms are identified upon initiation of Human Response Clinical Practice Guideline (CPG).   Outcome: Progressing    Goal: Skin Health and Integrity  Patient will demonstrate the desired outcomes by discharge/transition of care.   Outcome: Progressing      Problem: Fall Risk (Adult)  Goal: Identify Related Risk Factors and Signs and Symptoms  Related risk factors and signs and symptoms are identified upon initiation of Human Response Clinical Practice Guideline (CPG).   Outcome: Progressing    Goal: Absence of Fall  Patient will demonstrate the desired outcomes by discharge/transition of care.   Outcome: Progressing

## 2017-08-12 NOTE — Unmapped (Signed)
ORTHOPAEDIC SURGERY PROGRESS NOTE  Attending: Cameron Ali    s/p posterior cervical and thoracic decompression from T3-T5, posterior cervical and thoracic fusion from C7-T8     ASSESSMENT/PLAN:  [name] is a 76 y.o. male doing well postoperatively.     -Activities as tolerated  -Prednisone 5mg  BID per onc recs  -Drains removed this morning, antibiotics discontinued.   -Foley with continued blood clots in urine output. Urology consulted. Appreciate recs.   -HOB as tolerated  -Q4 hour neuro checks  -Dressing: change prn  -DVT Ppx: Will discuss. Please hold for now.     SUBJECTIVE:  Doing well post-operatively. Sitting in chair this afternnon. Has been mobilizing. Worked well with PT yesterday. Drain output decreasing. Hoping to discharge home     OBJECTIVE:  PE:    Vitals:    08/12/17 0501   BP: 112/70   Pulse: 80   Resp: 16   Temp: 36 ??C   SpO2: 98%     Alert and oriented.   RLE:   5-/5 TA, 4/5 EHL, 5-/5 GS, 4/5 quad, 4/5 IP  LLE:  5-/5 TA, 5-/5 EHL, 5-/5 GS, 4/5 quad, 4/5 IP  SILT: L3-S1    * Please page Clement Sayres, NP [phone] with any questions while patient in inpatient. Please contact the resident who leaves daily progress notes for any questions between 3-5PM and then Page orthopaedic consult pager [phone]) on nights (after 5PM) and weekends    - Current orthopaedic contact resident: Rodell Perna [phone]  - Current orthopaedic care attending: Cavanaugh  - On nights (6pm-6am), weekends, and holidays, please page Orthopaedic Consult pager.

## 2017-08-13 MED ORDER — ENOXAPARIN 40 MG/0.4 ML SUBCUTANEOUS SYRINGE
SUBCUTANEOUS | 0 refills | 0.00000 days | Status: CP
Start: 2017-08-13 — End: 2017-08-30

## 2017-08-13 NOTE — Unmapped (Signed)
Surgical pathology (T3-T4 bone) final result has been forwarded to ordering provider, Dr Frederik Pear for his review and his course of action.

## 2017-08-14 LAB — TESTOSTERONE, FREE, TOTAL

## 2017-08-14 LAB — TESTOSTERONE (MAYO): Testosterone:MCnc:Pt:Ser/Plas:Qn:: <7 — ABNORMAL LOW

## 2017-08-24 NOTE — Unmapped (Signed)
Radiation Oncology Treatment Planning Note    Patient Name: Stephen Bartlett  Patient Age: 77 y.o.  Date of Encounter: 08/25/2017    Diagnoses:   1. Malignant neoplasm of prostate (CMS-HCC)    2. Bone metastases (CMS-HCC)      Stage: Cancer Staging  No matching staging information was found for the patient.     Treatment Intent: palliative.    CLINICAL TREATMENT PLANNING:     Stephen Bartlett has metastatic prostate cancer with cord compression at T3-4 s/p T3-5 thoracic decompression. Plan for post-op RT. Refer to the consult for full clinical details.    I plan to treat him/her with photons utilizing 3D CRT technique.     The radiation target area/treatment site will be T2-T6.     I will attempt to minimize the dose to gastrointenstinal system, lungs and kidneys.    The total radiation dose will be 300 cGy at 3000 cGy/fraction for a total of 10 fractions, treated once a day. Chemotherapy not administered.    Technique Rationale:  3DCRT: 3D conformal therapy is necessary to increase dose conformity and to review DVH's and to review isodose distribution for gastrointenstinal system, lungs and esophagus.     Simulation Order: I requested radiation treatment planning CT scan to acquire information regarding patient's anatomy of the treatment site, radiation treatment target volumes, and adjacent normal organs. This is required to be done in patient's treatment position for radiation treatment planning and for patient's subsequent radiation treatment positioning and treatment setups. .       Verification Simulation: I have ordered for the patient to come in for verification simulation on the treatment machine to ensure that information was transferred appropriately from the planning system to the treatment machine treatment and to verify patient setup, immobilization, and image guidance.    Image Guidance/Tracking: Weekly PORT films to verify agreement of treatment ports with original planned fields.    SIMULATION:    Type: Initial simulation of the spine.    Contrast: none.    The patient was taken to the CT simulation room and placed in a supine position. A CT scan was obtained through the pertinent area including thoracic spine. I approved the patient set up and reviewed the CT images; both are adequate. I tentatively plan to utilize multiple fields. The number and use of treatment devices will be determined at the time of computerized planning.    I have placed an isocenter/localization point in three dimensions on these images.  This was marked on the patient???s skin for subsequent radiation treatment set-up.  Additional details of the CT simulation are available in the departmental [facility] electronic medical record.  CT images were then transferred to the radiation treatment-planning computer for planning and dosimetry.      Electronically signed by:  Marcelyn Bruins, MD PhD  Radiation Oncology PGY-2  08/24/2017 12:11 PM    ATTENDING NOTE:  I saw and evaluated the patient, participating in the key portions of the service.  I reviewed the resident???s note.  I agree with the resident???s findings and plan. John Giovanni, MD

## 2017-08-25 ENCOUNTER — Ambulatory Visit
Admit: 2017-08-25 | Discharge: 2017-09-22 | Payer: MEDICARE | Attending: Radiation Oncology | Primary: Radiation Oncology

## 2017-08-25 ENCOUNTER — Ambulatory Visit: Admit: 2017-08-25 | Discharge: 2017-09-22 | Payer: MEDICARE

## 2017-08-25 ENCOUNTER — Other Ambulatory Visit: Admit: 2017-08-25 | Discharge: 2017-09-22 | Payer: MEDICARE

## 2017-08-25 DIAGNOSIS — C61 Malignant neoplasm of prostate: Principal | ICD-10-CM

## 2017-08-25 DIAGNOSIS — C7951 Secondary malignant neoplasm of bone: Secondary | ICD-10-CM

## 2017-08-25 DIAGNOSIS — Z510 Encounter for antineoplastic radiation therapy: Principal | ICD-10-CM

## 2017-08-25 DIAGNOSIS — C7A8 Other malignant neuroendocrine tumors: Secondary | ICD-10-CM

## 2017-08-25 LAB — COMPREHENSIVE METABOLIC PANEL
ALBUMIN: 3.9 g/dL (ref 3.5–5.0)
ALKALINE PHOSPHATASE: 92 U/L (ref 38–126)
ALT (SGPT): 32 U/L (ref 19–72)
ANION GAP: 8 mmol/L — ABNORMAL LOW (ref 9–15)
BILIRUBIN TOTAL: 0.7 mg/dL (ref 0.0–1.2)
BLOOD UREA NITROGEN: 17 mg/dL (ref 7–21)
BUN / CREAT RATIO: 24
CALCIUM: 9.7 mg/dL (ref 8.5–10.2)
CHLORIDE: 102 mmol/L (ref 98–107)
CO2: 29 mmol/L (ref 22.0–30.0)
CREATININE: 0.71 mg/dL (ref 0.70–1.30)
EGFR MDRD AF AMER: >=60 mL/min/{1.73_m2} (ref >=60)
EGFR MDRD NON AF AMER: >=60 mL/min/{1.73_m2} (ref >=60)
GLUCOSE RANDOM: 96 mg/dL (ref 65–179)
POTASSIUM: 4.2 mmol/L (ref 3.5–5.0)
PROTEIN TOTAL: 6.6 g/dL (ref 6.5–8.3)
SODIUM: 139 mmol/L (ref 135–145)

## 2017-08-25 LAB — CBC W/ AUTO DIFF
BASOPHILS ABSOLUTE COUNT: 0 10*9/L (ref 0.0–0.1)
HEMATOCRIT: 29 % — ABNORMAL LOW (ref 41.0–53.0)
HEMOGLOBIN: 9.7 g/dL — ABNORMAL LOW (ref 13.5–17.5)
LARGE UNSTAINED CELLS: 2 % (ref 0–4)
MEAN CORPUSCULAR HEMOGLOBIN CONC: 33.3 g/dL (ref 31.0–37.0)
MEAN CORPUSCULAR HEMOGLOBIN: 31.6 pg (ref 26.0–34.0)
MEAN CORPUSCULAR VOLUME: 94.9 fL (ref 80.0–100.0)
MEAN PLATELET VOLUME: 7.3 fL (ref 7.0–10.0)
MONOCYTES ABSOLUTE COUNT: 0.5 10*9/L (ref 0.2–0.8)
NEUTROPHILS ABSOLUTE COUNT: 4.7 10*9/L (ref 2.0–7.5)
RED BLOOD CELL COUNT: 3.06 10*12/L — ABNORMAL LOW (ref 4.50–5.90)
RED CELL DISTRIBUTION WIDTH: 13.8 % (ref 12.0–15.0)
WBC ADJUSTED: 6.8 10*9/L (ref 4.5–11.0)

## 2017-08-25 LAB — SODIUM: Sodium:SCnc:Pt:Ser/Plas:Qn:: 139

## 2017-08-25 LAB — PROSTATE SPECIFIC ANTIGEN: Prostate specific Ag:MCnc:Pt:Ser/Plas:Qn:: 61.5 — ABNORMAL HIGH

## 2017-08-25 LAB — HEMATOCRIT: Lab: 29 — ABNORMAL LOW

## 2017-08-25 NOTE — Unmapped (Signed)
Great seeing you in clinic today.    Dorna Leitz, MD    For appointments & questions Monday through Friday 8 AM??? 5 PM   Please call [phone] or Toll free [phone].    On Nights, Weekends and Holidays  Call [phone] and ask for the oncologist on call.    Laury Deep is my Statistician. She can answer questions and connect you with me.  Please call [phone] or Toll free [phone].  [email or web address]    [facility]  [address]  [place], [place] [address]  [email or web address]

## 2017-08-25 NOTE — Unmapped (Signed)
Lab drawn and sent for analysis.

## 2017-08-25 NOTE — Unmapped (Signed)
Radiation Oncology Initial Visit Note  ??  Encounter Date: 08/25/2017  Patient Name: Stephen Bartlett  Date of Birth: 04-20-1941  ZOX:[id number]  Patient Age: 77 y.o.   ??  Primary Care Provider:   [facility]  ??  Assessment:  Stephen Bartlett is a 77 y.o. male with long history of metastatic prostate cancer currently on Abiraterone with Lupron newly presenting with cord compression at T3-4 s/p T3-5 thoracic decompression. He also has a low-grade pancreatic neuroendocrine tumor under observation.  ??  Recommendations:  After reviewing his case, we believe that he would benefit from post-op radiation to his thoracic spine in the T2-T6 region (to encompass the T3-T5 regions of initial vertebral disease) for local control. We plan to treat to 30 Gy in 10 fractions.     We discussed with the patient and family members his initial hospital presentation, and post-op course in rehab.  ??  We reviewed with the patient the benefits and potential risks of radiation therapy including, but not limited to, possible fatigue, nausea, sore throat, esophagitis and skin irritation within the treatment field. After discussion, all of patient's questions were answered and he is in agreement with the plan. Patient had CT simulation today and we plan to start treatment next week. We discussed taking a pain pill from rehab facility prior to each treatment as the table was uncomfortable for him today. Our contact information has been provided in case any additional questions arise in the meantime.   ??  Dr. Verl Bangs plans to change the patient's systemic therapy in a few weeks.??  ??  ??  History of Present Illness:  ??  2013: Presented with metastatic prostate cancer with a right acetabular lesion and suspicious vertebral lesions (L1, T4, and T5) (presented with PSA in the 900s). Treated with Lupron and eventually changed to Avodart and Casodex (due to breast tenderness and decreased libido).   ??  2014: Restarted on Lupron and Casodex 11/08/2014: Bone scan with decreased intensity in right pelvis and upper T-spine  ??  10/2016: Switched to Abiraterone with Lupron (due to rising PSA).  ??  01/07/2017: Surveillance CT abdomen/pelvis revealed a 1.7 cm lesion in the body of the pancreas concerning for neuroendocrine tumor.   -- Bone scan with increased uptake in the thoracic spine without new sites of activity  ??  02/11/2017: FNA revealed low-grade neuroendocrine tumor. Plan for observance.  ??  08/05/2017: Presented to [facility] after a fall due to leg weakness with MRI spine revealing an expansile mass involving the T3-5 vertebral bodies causing cord compression at T3-4.  ??  08/06/2017: Seen by orthopedic surgery and taken to the OR for T3-5 thoracic decompression he also had a posterior cervical and thoracic fusion from C5-T8.   ??  Overall he is doing well today. He is at a rehab facility nearby and states he is building up his strength and able to walk a mile a day and ride a bicycle. He normally takes 0-2 pain pills a day but says he has pain right now due to the simulation table.   ??  Prior Radiation Therapy: No  Pacemaker: No  Pregnancy status: No; male patient  ??  Review of Systems:  All others negative except for any pertinent positives noted in the HPI  ??   PAST MEDICAL HISTORY:  Past??Medical??History        Past Medical History:   Diagnosis Date   ??? Calculus of kidney ??   ??? Hypertension ??   ???  Malignant neoplasm of prostate (CMS-HCC) ??      ??  ??  FAMILY HISTORY:  Family??History         Family History   Problem Relation Age of Onset   ??? Hypertension Mother ??   ??? Stroke Mother ??   ??? Prostate cancer Other ??   ??? GU problems Neg Hx ??   ??? Kidney cancer Neg Hx ??   ??? Urolithiasis Neg Hx ??      ??  ??  SOCIAL HISTORY:   Social??History   Social History   ??        Social History   ??? Marital status: Married   ?? ?? Spouse name: N/A   ??? Number of children: N/A   ??? Years of education: N/A   ??      Occupational History   ??? Not on file.   ??         Social History Main Topics   ??? Smoking status: Never Smoker   ??? Smokeless tobacco: Never Used   ??? Alcohol use 0.6 oz/week   ?? ?? 1 Cans of beer per week   ?? ?? ?? Comment: now and then   ??? Drug use: Yes   ?? ?? Types: Marijuana   ?? ?? ?? Comment: Every now and then for pain. per 01/31/17 report.    ??? Sexual activity: Not on file   ??       Other Topics Concern   ??? Not on file   ??      Social History Narrative   ??? No narrative on file      ??  ??  No Known Allergies  ??  Current Medications:  Current??Medications             Current Facility-Administered Medications   Medication Dose Route Frequency Provider Last Rate Last Dose   ??? abiraterone (ZYTIGA) tablet 1,000 mg **patient supplied**  1,000 mg Oral Daily Jacklynn Barnacle, MD   1,000 mg at 08/08/17 0945   ??? acetaminophen (TYLENOL) tablet 650 mg  650 mg Oral Q6H Wyonia Hough, MD   650 mg at 08/08/17 1121   ??? ceFAZolin (ANCEF) IVPB 2 g in 50 ml dextrose (premix)  2 g Intravenous Q8H Jacklynn Barnacle, MD 100 mL/hr at 08/08/17 1249 2 g at 08/08/17 1249   ??? docusate sodium (COLACE) capsule 100 mg  100 mg Oral Daily Wyonia Hough, MD   100 mg at 08/08/17 0935   ??? influenza vaccine quad syringe (FLULAVAL) (6 MOS & UP) (PF) 2018-19  0.5 mL Intramuscular During hospitalization Katherina Mires, MD       ??? MORPhine 4 mg/mL injection 2 mg  2 mg Intravenous Q4H PRN Jacklynn Barnacle, MD   2 mg at 08/08/17 0004   ??? naloxone (NARCAN) injection 0.1 mg  0.1 mg Intravenous Q5 Min PRN Jacklynn Barnacle, MD       ??? ondansetron (ZOFRAN-ODT) disintegrating tablet 4 mg  4 mg Oral Q6H PRN Wyonia Hough, MD       ??? oxyCODONE (ROXICODONE) immediate release tablet 5 mg  5 mg Oral Q4H PRN Wyonia Hough, MD   5 mg at 08/08/17 0532   ??? pneumococcal polysacchride (23-valps) (PNEUMOVAX) vaccine 0.5 mL  0.5 mL Subcutaneous During hospitalization Katherina Mires, MD       ??? polyethylene glycol (MIRALAX) packet 17 g  17 g Oral Daily Lockie Pares  Henrene Hawking, MD   17 g at 08/08/17 0935   ??? predniSONE (DELTASONE) tablet 5 mg  5 mg Oral BID Jacklynn Barnacle, MD   5 mg at 08/08/17 0532      ??  ??  ??  Physical Exam:       Vitals:   ?? 08/08/17 0435   BP: 154/84   Pulse: 79   Resp: 16   Temp: 36.7 ??C   SpO2: 97%   ??  General: Well developed, well nourished, no acute distress  Eyes: Extra occular movements intact.   EARS/Nose/Mouth/Throat: Moist mucous membranes.   Cardio: Normal rate.  Respiratory: Normal work of breathing.  Gastrointestinal: Abdomen non-distended.  Neuro: Alert and oriented.   MSK/Extremities:  No clubbing cyanosis, or edema.  Psychiatric:  Normal mood and affect.  Converses clearly and emotionally appropriate.   ??  ??  Imaging, Labs, and Pathology:  We have personally reviewed the imaging studies as detailed above.   ??  Marcelyn Bruins, MD PhD  Radiation Oncology PGY-2  08/25/2017 11:40 AM    ATTENDING NOTE:  I saw and evaluated the patient, participating in the key portions of the service.  I reviewed the resident???s note.  I agree with the resident???s findings and plan. John Giovanni, MD    I reviewed the radiographic images.

## 2017-08-29 ENCOUNTER — Ambulatory Visit
Admit: 2017-08-29 | Discharge: 2017-08-30 | Payer: MEDICARE | Attending: Orthopaedic Surgery of the Spine | Primary: Orthopaedic Surgery of the Spine

## 2017-08-29 ENCOUNTER — Ambulatory Visit: Admit: 2017-08-29 | Discharge: 2017-08-30 | Payer: MEDICARE

## 2017-08-29 DIAGNOSIS — M4714 Other spondylosis with myelopathy, thoracic region: Principal | ICD-10-CM

## 2017-08-29 DIAGNOSIS — D3A8 Other benign neuroendocrine tumors: Principal | ICD-10-CM

## 2017-08-29 DIAGNOSIS — Z981 Arthrodesis status: Principal | ICD-10-CM

## 2017-08-30 DIAGNOSIS — Z510 Encounter for antineoplastic radiation therapy: Principal | ICD-10-CM

## 2017-08-30 DIAGNOSIS — C61 Malignant neoplasm of prostate: Secondary | ICD-10-CM

## 2017-08-30 DIAGNOSIS — C7A8 Other malignant neuroendocrine tumors: Secondary | ICD-10-CM

## 2017-08-31 DIAGNOSIS — C7A8 Other malignant neuroendocrine tumors: Secondary | ICD-10-CM

## 2017-08-31 DIAGNOSIS — Z510 Encounter for antineoplastic radiation therapy: Principal | ICD-10-CM

## 2017-08-31 DIAGNOSIS — C61 Malignant neoplasm of prostate: Secondary | ICD-10-CM

## 2017-09-01 DIAGNOSIS — Z510 Encounter for antineoplastic radiation therapy: Principal | ICD-10-CM

## 2017-09-01 DIAGNOSIS — C7A8 Other malignant neuroendocrine tumors: Secondary | ICD-10-CM

## 2017-09-01 DIAGNOSIS — C61 Malignant neoplasm of prostate: Secondary | ICD-10-CM

## 2017-09-02 DIAGNOSIS — Z510 Encounter for antineoplastic radiation therapy: Principal | ICD-10-CM

## 2017-09-02 DIAGNOSIS — C7A8 Other malignant neuroendocrine tumors: Secondary | ICD-10-CM

## 2017-09-02 DIAGNOSIS — C61 Malignant neoplasm of prostate: Secondary | ICD-10-CM

## 2017-09-05 DIAGNOSIS — C7A8 Other malignant neuroendocrine tumors: Secondary | ICD-10-CM

## 2017-09-05 DIAGNOSIS — C7951 Secondary malignant neoplasm of bone: Secondary | ICD-10-CM

## 2017-09-05 DIAGNOSIS — Z510 Encounter for antineoplastic radiation therapy: Principal | ICD-10-CM

## 2017-09-05 DIAGNOSIS — C61 Malignant neoplasm of prostate: Secondary | ICD-10-CM

## 2017-09-06 DIAGNOSIS — C61 Malignant neoplasm of prostate: Secondary | ICD-10-CM

## 2017-09-06 DIAGNOSIS — C7A8 Other malignant neuroendocrine tumors: Secondary | ICD-10-CM

## 2017-09-06 DIAGNOSIS — Z510 Encounter for antineoplastic radiation therapy: Principal | ICD-10-CM

## 2017-09-07 DIAGNOSIS — C7A8 Other malignant neuroendocrine tumors: Secondary | ICD-10-CM

## 2017-09-07 DIAGNOSIS — Z510 Encounter for antineoplastic radiation therapy: Principal | ICD-10-CM

## 2017-09-07 DIAGNOSIS — C61 Malignant neoplasm of prostate: Secondary | ICD-10-CM

## 2017-09-09 DIAGNOSIS — C61 Malignant neoplasm of prostate: Secondary | ICD-10-CM

## 2017-09-09 DIAGNOSIS — C7A8 Other malignant neuroendocrine tumors: Secondary | ICD-10-CM

## 2017-09-09 DIAGNOSIS — Z510 Encounter for antineoplastic radiation therapy: Principal | ICD-10-CM

## 2017-09-13 DIAGNOSIS — Z510 Encounter for antineoplastic radiation therapy: Principal | ICD-10-CM

## 2017-09-13 DIAGNOSIS — C61 Malignant neoplasm of prostate: Secondary | ICD-10-CM

## 2017-09-13 DIAGNOSIS — C7A8 Other malignant neuroendocrine tumors: Secondary | ICD-10-CM

## 2017-09-13 MED FILL — ZYTIGA/250MG/TAB: ZYTIGA/250MG/TAB | 30 days supply | Qty: 120 | Fill #9

## 2017-09-14 DIAGNOSIS — C7A8 Other malignant neuroendocrine tumors: Secondary | ICD-10-CM

## 2017-09-14 DIAGNOSIS — Z510 Encounter for antineoplastic radiation therapy: Principal | ICD-10-CM

## 2017-09-14 DIAGNOSIS — C61 Malignant neoplasm of prostate: Secondary | ICD-10-CM

## 2017-09-15 ENCOUNTER — Telehealth: Payer: Self-pay | Admitting: Internal Medicine

## 2017-09-15 DIAGNOSIS — C61 Malignant neoplasm of prostate: Secondary | ICD-10-CM

## 2017-09-15 DIAGNOSIS — C7A8 Other malignant neuroendocrine tumors: Secondary | ICD-10-CM

## 2017-09-15 DIAGNOSIS — Z510 Encounter for antineoplastic radiation therapy: Principal | ICD-10-CM

## 2017-09-15 NOTE — Telephone Encounter (Signed)
This is the message you ask me to send back to you.

## 2017-09-15 NOTE — Telephone Encounter (Signed)
Please advise 

## 2017-09-15 NOTE — Telephone Encounter (Signed)
Copied from [facility] [id number]. Topic: Quick Communication - See Telephone Encounter >> Sep 15, 2017  2:18 PM Vernona Rieger wrote: CRM for notification. See Telephone encounter for:   09/15/17.  Esther from [facility] called and wanted to let Dr Nicki Reaper know she declined her OT eval.

## 2017-09-15 NOTE — Telephone Encounter (Signed)
Patient is not a patient here in this office.

## 2017-09-15 NOTE — Telephone Encounter (Signed)
Not sure why [facility] called [facility] for this. She stated that the patient was a patient in the office.

## 2017-09-22 ENCOUNTER — Ambulatory Visit: Admit: 2017-09-22 | Discharge: 2017-09-22 | Payer: MEDICARE

## 2017-09-22 ENCOUNTER — Other Ambulatory Visit: Admit: 2017-09-22 | Discharge: 2017-09-22 | Payer: MEDICARE

## 2017-09-22 DIAGNOSIS — C61 Malignant neoplasm of prostate: Principal | ICD-10-CM

## 2017-09-22 DIAGNOSIS — C7951 Secondary malignant neoplasm of bone: Secondary | ICD-10-CM

## 2017-10-10 ENCOUNTER — Ambulatory Visit
Admit: 2017-10-10 | Discharge: 2017-10-10 | Payer: MEDICARE | Attending: Orthopaedic Surgery of the Spine | Primary: Orthopaedic Surgery of the Spine

## 2017-10-10 ENCOUNTER — Ambulatory Visit: Admit: 2017-10-10 | Discharge: 2017-10-10 | Payer: MEDICARE

## 2017-10-10 DIAGNOSIS — Z981 Arthrodesis status: Principal | ICD-10-CM

## 2017-11-10 MED ORDER — OXYCODONE 5 MG TABLET
ORAL_TABLET | Freq: Four times a day (QID) | ORAL | 0 refills | 0 days | Status: CP | PRN
Start: 2017-11-10 — End: 2018-01-09

## 2017-12-15 ENCOUNTER — Ambulatory Visit: Admit: 2017-12-15 | Discharge: 2017-12-15 | Payer: MEDICARE | Attending: Medical Oncology | Primary: Medical Oncology

## 2017-12-15 DIAGNOSIS — C7951 Secondary malignant neoplasm of bone: Secondary | ICD-10-CM

## 2017-12-15 DIAGNOSIS — C61 Malignant neoplasm of prostate: Secondary | ICD-10-CM

## 2018-01-09 ENCOUNTER — Ambulatory Visit
Admit: 2018-01-09 | Discharge: 2018-01-09 | Payer: MEDICARE | Attending: Orthopaedic Surgery of the Spine | Primary: Orthopaedic Surgery of the Spine

## 2018-01-09 ENCOUNTER — Ambulatory Visit: Admit: 2018-01-09 | Discharge: 2018-01-09 | Payer: MEDICARE

## 2018-01-09 DIAGNOSIS — Z981 Arthrodesis status: Principal | ICD-10-CM

## 2018-01-09 MED ORDER — OXYCODONE 5 MG TABLET
ORAL_TABLET | Freq: Four times a day (QID) | ORAL | 0 refills | 0 days | Status: CP | PRN
Start: 2018-01-09 — End: 2018-03-30

## 2018-01-26 ENCOUNTER — Other Ambulatory Visit: Admit: 2018-01-26 | Discharge: 2018-02-08 | Payer: MEDICARE

## 2018-01-26 ENCOUNTER — Ambulatory Visit: Admit: 2018-01-26 | Discharge: 2018-02-08 | Payer: MEDICARE

## 2018-01-26 ENCOUNTER — Ambulatory Visit: Admit: 2018-01-26 | Discharge: 2018-02-08 | Payer: MEDICARE | Attending: Medical Oncology | Primary: Medical Oncology

## 2018-01-26 DIAGNOSIS — C61 Malignant neoplasm of prostate: Principal | ICD-10-CM

## 2018-01-26 DIAGNOSIS — C7951 Secondary malignant neoplasm of bone: Secondary | ICD-10-CM

## 2018-03-02 ENCOUNTER — Other Ambulatory Visit: Admit: 2018-03-02 | Discharge: 2018-03-08 | Payer: MEDICARE

## 2018-03-02 ENCOUNTER — Ambulatory Visit: Admit: 2018-03-02 | Discharge: 2018-03-08 | Payer: MEDICARE

## 2018-03-02 DIAGNOSIS — C61 Malignant neoplasm of prostate: Principal | ICD-10-CM

## 2018-03-02 DIAGNOSIS — C7951 Secondary malignant neoplasm of bone: Secondary | ICD-10-CM

## 2018-03-20 ENCOUNTER — Ambulatory Visit: Admit: 2018-03-20 | Discharge: 2018-03-20 | Payer: MEDICARE

## 2018-03-20 DIAGNOSIS — D3A8 Other benign neuroendocrine tumors: Principal | ICD-10-CM

## 2018-03-30 ENCOUNTER — Ambulatory Visit: Admit: 2018-03-30 | Discharge: 2018-04-05 | Payer: MEDICARE

## 2018-03-30 ENCOUNTER — Other Ambulatory Visit: Admit: 2018-03-30 | Discharge: 2018-04-05 | Payer: MEDICARE

## 2018-03-30 ENCOUNTER — Ambulatory Visit: Admit: 2018-03-30 | Discharge: 2018-04-05 | Payer: MEDICARE | Attending: Medical Oncology | Primary: Medical Oncology

## 2018-03-30 DIAGNOSIS — C61 Malignant neoplasm of prostate: Principal | ICD-10-CM

## 2018-03-30 DIAGNOSIS — C7951 Secondary malignant neoplasm of bone: Secondary | ICD-10-CM

## 2018-03-30 MED ORDER — IBUPROFEN 600 MG TABLET
ORAL_TABLET | Freq: Three times a day (TID) | ORAL | 3 refills | 0 days | Status: CP | PRN
Start: 2018-03-30 — End: ?

## 2018-03-30 MED ORDER — DEXAMETHASONE 2 MG TABLET
ORAL_TABLET | Freq: Every day | ORAL | 0 refills | 0.00000 days | Status: CP
Start: 2018-03-30 — End: 2018-04-06

## 2018-04-03 ENCOUNTER — Ambulatory Visit: Admit: 2018-04-03 | Discharge: 2018-04-04 | Payer: MEDICARE

## 2018-04-03 DIAGNOSIS — C259 Malignant neoplasm of pancreas, unspecified: Secondary | ICD-10-CM

## 2018-04-03 DIAGNOSIS — D3A8 Other benign neuroendocrine tumors: Principal | ICD-10-CM

## 2018-05-04 ENCOUNTER — Other Ambulatory Visit: Admit: 2018-05-04 | Discharge: 2018-05-17 | Payer: MEDICARE

## 2018-05-04 ENCOUNTER — Ambulatory Visit: Admit: 2018-05-04 | Discharge: 2018-05-17 | Payer: MEDICARE

## 2018-05-04 ENCOUNTER — Ambulatory Visit: Admit: 2018-05-04 | Discharge: 2018-05-17 | Payer: MEDICARE | Attending: Medical Oncology | Primary: Medical Oncology

## 2018-05-04 DIAGNOSIS — C61 Malignant neoplasm of prostate: Principal | ICD-10-CM

## 2018-05-04 DIAGNOSIS — C7951 Secondary malignant neoplasm of bone: Secondary | ICD-10-CM

## 2018-05-04 MED ORDER — ACETAMINOPHEN 500 MG TABLET
ORAL_TABLET | Freq: Three times a day (TID) | ORAL | 3 refills | 0 days | Status: CP
Start: 2018-05-04 — End: 2019-01-09

## 2018-05-04 MED ORDER — FAMOTIDINE 20 MG TABLET
ORAL_TABLET | Freq: Every day | ORAL | 3 refills | 0 days | Status: CP
Start: 2018-05-04 — End: 2019-05-04

## 2018-05-04 MED ORDER — DEXAMETHASONE 2 MG TABLET
ORAL_TABLET | Freq: Every day | ORAL | 3 refills | 0 days | Status: CP
Start: 2018-05-04 — End: 2019-05-04

## 2018-06-01 ENCOUNTER — Ambulatory Visit: Admit: 2018-06-01 | Discharge: 2018-06-14 | Payer: MEDICARE | Attending: Medical Oncology | Primary: Medical Oncology

## 2018-06-01 ENCOUNTER — Ambulatory Visit: Admit: 2018-06-01 | Discharge: 2018-06-14 | Payer: MEDICARE

## 2018-06-01 ENCOUNTER — Other Ambulatory Visit: Admit: 2018-06-01 | Discharge: 2018-06-14 | Payer: MEDICARE

## 2018-06-01 DIAGNOSIS — C61 Malignant neoplasm of prostate: Principal | ICD-10-CM

## 2018-06-01 DIAGNOSIS — C7951 Secondary malignant neoplasm of bone: Secondary | ICD-10-CM

## 2018-06-01 DIAGNOSIS — R358 Other polyuria: Secondary | ICD-10-CM

## 2018-06-01 DIAGNOSIS — R350 Frequency of micturition: Secondary | ICD-10-CM

## 2018-06-16 ENCOUNTER — Ambulatory Visit: Admit: 2018-06-16 | Discharge: 2018-06-17 | Payer: MEDICARE

## 2018-06-16 DIAGNOSIS — N200 Calculus of kidney: Secondary | ICD-10-CM

## 2018-06-16 DIAGNOSIS — R339 Retention of urine, unspecified: Principal | ICD-10-CM

## 2018-06-16 MED ORDER — ALLOPURINOL 100 MG TABLET
ORAL_TABLET | Freq: Every day | ORAL | 3 refills | 0 days | Status: CP
Start: 2018-06-16 — End: 2018-12-06

## 2018-07-06 ENCOUNTER — Ambulatory Visit: Admit: 2018-07-06 | Discharge: 2018-07-12 | Payer: MEDICARE

## 2018-07-06 ENCOUNTER — Other Ambulatory Visit: Admit: 2018-07-06 | Discharge: 2018-07-12 | Payer: MEDICARE

## 2018-07-06 ENCOUNTER — Ambulatory Visit
Admit: 2018-07-06 | Discharge: 2018-07-12 | Payer: MEDICARE | Attending: Nurse Practitioner | Primary: Nurse Practitioner

## 2018-07-06 DIAGNOSIS — C61 Malignant neoplasm of prostate: Secondary | ICD-10-CM

## 2018-07-06 DIAGNOSIS — C7951 Secondary malignant neoplasm of bone: Secondary | ICD-10-CM

## 2018-08-31 ENCOUNTER — Ambulatory Visit: Admit: 2018-08-31 | Discharge: 2018-09-01 | Payer: MEDICARE | Attending: Medical Oncology | Primary: Medical Oncology

## 2018-08-31 ENCOUNTER — Other Ambulatory Visit: Admit: 2018-08-31 | Discharge: 2018-09-01 | Payer: MEDICARE

## 2018-08-31 DIAGNOSIS — C61 Malignant neoplasm of prostate: Principal | ICD-10-CM

## 2018-08-31 DIAGNOSIS — C7951 Secondary malignant neoplasm of bone: Secondary | ICD-10-CM

## 2018-11-02 ENCOUNTER — Ambulatory Visit: Admit: 2018-11-02 | Discharge: 2018-11-15 | Payer: MEDICARE

## 2018-11-02 ENCOUNTER — Ambulatory Visit: Admit: 2018-11-02 | Discharge: 2018-12-01 | Payer: MEDICARE

## 2018-11-02 DIAGNOSIS — Z79899 Other long term (current) drug therapy: Principal | ICD-10-CM

## 2018-11-02 DIAGNOSIS — C7A8 Other malignant neuroendocrine tumors: Principal | ICD-10-CM

## 2018-11-02 DIAGNOSIS — C7951 Secondary malignant neoplasm of bone: Principal | ICD-10-CM

## 2018-11-02 DIAGNOSIS — J479 Bronchiectasis, uncomplicated: Principal | ICD-10-CM

## 2018-11-02 DIAGNOSIS — C61 Malignant neoplasm of prostate: Principal | ICD-10-CM

## 2018-11-02 DIAGNOSIS — K7689 Other specified diseases of liver: Principal | ICD-10-CM

## 2018-11-16 DIAGNOSIS — C61 Malignant neoplasm of prostate: Principal | ICD-10-CM

## 2018-11-16 DIAGNOSIS — C7951 Secondary malignant neoplasm of bone: Principal | ICD-10-CM

## 2018-11-23 ENCOUNTER — Institutional Professional Consult (permissible substitution): Admit: 2018-11-23 | Discharge: 2018-11-24 | Payer: MEDICARE

## 2018-11-23 DIAGNOSIS — C7951 Secondary malignant neoplasm of bone: Principal | ICD-10-CM

## 2018-11-23 DIAGNOSIS — C61 Malignant neoplasm of prostate: Secondary | ICD-10-CM

## 2018-12-06 MED ORDER — ALLOPURINOL 100 MG TABLET
ORAL_TABLET | Freq: Every day | ORAL | 3 refills | 0 days | Status: CP
Start: 2018-12-06 — End: ?

## 2019-01-12 ENCOUNTER — Institutional Professional Consult (permissible substitution): Admit: 2019-01-12 | Discharge: 2019-01-13 | Payer: MEDICARE

## 2019-01-12 DIAGNOSIS — Z515 Encounter for palliative care: Secondary | ICD-10-CM

## 2019-01-12 DIAGNOSIS — Z72820 Sleep deprivation: Secondary | ICD-10-CM

## 2019-01-12 DIAGNOSIS — G893 Neoplasm related pain (acute) (chronic): Secondary | ICD-10-CM

## 2019-01-12 DIAGNOSIS — C7951 Secondary malignant neoplasm of bone: Principal | ICD-10-CM

## 2019-01-12 DIAGNOSIS — C61 Malignant neoplasm of prostate: Secondary | ICD-10-CM

## 2019-01-12 MED ORDER — OXYCODONE 5 MG TABLET
ORAL_TABLET | Freq: Two times a day (BID) | ORAL | 0 refills | 0 days | Status: CP | PRN
Start: 2019-01-12 — End: 2019-02-05

## 2019-02-05 ENCOUNTER — Institutional Professional Consult (permissible substitution): Admit: 2019-02-05 | Discharge: 2019-02-06 | Payer: MEDICARE

## 2019-02-05 DIAGNOSIS — C7951 Secondary malignant neoplasm of bone: Secondary | ICD-10-CM

## 2019-02-05 DIAGNOSIS — C61 Malignant neoplasm of prostate: Secondary | ICD-10-CM

## 2019-02-05 DIAGNOSIS — G893 Neoplasm related pain (acute) (chronic): Principal | ICD-10-CM

## 2019-02-05 MED ORDER — OXYCODONE 5 MG TABLET
ORAL_TABLET | Freq: Two times a day (BID) | ORAL | 0 refills | 0 days | Status: CP | PRN
Start: 2019-02-05 — End: 2019-03-05

## 2019-03-05 ENCOUNTER — Institutional Professional Consult (permissible substitution): Admit: 2019-03-05 | Discharge: 2019-03-05 | Payer: MEDICARE

## 2019-03-05 DIAGNOSIS — C61 Malignant neoplasm of prostate: Secondary | ICD-10-CM

## 2019-03-05 DIAGNOSIS — G893 Neoplasm related pain (acute) (chronic): Secondary | ICD-10-CM

## 2019-03-05 DIAGNOSIS — C7951 Secondary malignant neoplasm of bone: Principal | ICD-10-CM

## 2019-03-05 MED ORDER — OXYCODONE 5 MG TABLET
ORAL_TABLET | Freq: Two times a day (BID) | ORAL | 0 refills | 30.00000 days | Status: CP | PRN
Start: 2019-03-05 — End: 2019-04-02

## 2019-04-02 ENCOUNTER — Institutional Professional Consult (permissible substitution): Admit: 2019-04-02 | Discharge: 2019-04-03 | Payer: MEDICARE

## 2019-04-02 DIAGNOSIS — C61 Malignant neoplasm of prostate: Secondary | ICD-10-CM

## 2019-04-02 DIAGNOSIS — Z515 Encounter for palliative care: Secondary | ICD-10-CM

## 2019-04-02 DIAGNOSIS — G893 Neoplasm related pain (acute) (chronic): Secondary | ICD-10-CM

## 2019-04-02 DIAGNOSIS — C7951 Secondary malignant neoplasm of bone: Principal | ICD-10-CM

## 2019-04-02 MED ORDER — OXYCODONE 5 MG TABLET
ORAL_TABLET | Freq: Two times a day (BID) | ORAL | 0 refills | 30 days | Status: CP | PRN
Start: 2019-04-02 — End: ?

## 2019-05-01 MED ORDER — FAMOTIDINE 20 MG TABLET
ORAL_TABLET | 3 refills | 0 days | Status: CP
Start: 2019-05-01 — End: 2019-05-02

## 2019-05-02 ENCOUNTER — Institutional Professional Consult (permissible substitution): Admit: 2019-05-02 | Payer: MEDICARE

## 2019-05-02 MED ORDER — DEXAMETHASONE 2 MG TABLET
ORAL_TABLET | 3 refills | 0 days | Status: CP
Start: 2019-05-02 — End: ?

## 2019-05-02 MED ORDER — FAMOTIDINE 20 MG TABLET
ORAL_TABLET | 3 refills | 0 days | Status: CP
Start: 2019-05-02 — End: ?

## 2019-05-10 ENCOUNTER — Ambulatory Visit: Admit: 2019-05-10 | Discharge: 2019-05-10 | Payer: MEDICARE | Attending: Medical Oncology | Primary: Medical Oncology

## 2019-05-10 ENCOUNTER — Ambulatory Visit: Admit: 2019-05-10 | Discharge: 2019-05-10 | Payer: MEDICARE

## 2019-05-10 DIAGNOSIS — C61 Malignant neoplasm of prostate: Secondary | ICD-10-CM

## 2019-05-23 ENCOUNTER — Ambulatory Visit: Admit: 2019-05-23 | Discharge: 2019-05-24 | Payer: MEDICARE

## 2019-05-23 ENCOUNTER — Institutional Professional Consult (permissible substitution): Admit: 2019-05-23 | Discharge: 2019-05-24 | Payer: MEDICARE

## 2019-05-23 DIAGNOSIS — C7951 Secondary malignant neoplasm of bone: Secondary | ICD-10-CM

## 2019-05-23 DIAGNOSIS — G893 Neoplasm related pain (acute) (chronic): Secondary | ICD-10-CM

## 2019-05-23 DIAGNOSIS — C61 Malignant neoplasm of prostate: Secondary | ICD-10-CM

## 2019-05-23 DIAGNOSIS — Z515 Encounter for palliative care: Secondary | ICD-10-CM

## 2019-05-23 MED ORDER — OXYCODONE 5 MG TABLET
ORAL_TABLET | Freq: Two times a day (BID) | ORAL | 0 refills | 30 days | Status: CP | PRN
Start: 2019-05-23 — End: ?

## 2019-05-24 ENCOUNTER — Telehealth: Admit: 2019-05-24 | Discharge: 2019-05-25 | Payer: MEDICARE | Attending: Medical Oncology | Primary: Medical Oncology

## 2019-05-24 DIAGNOSIS — C61 Malignant neoplasm of prostate: Secondary | ICD-10-CM

## 2019-05-31 ENCOUNTER — Ambulatory Visit: Admit: 2019-05-31 | Discharge: 2019-06-01 | Payer: MEDICARE

## 2019-05-31 DIAGNOSIS — C7951 Secondary malignant neoplasm of bone: Secondary | ICD-10-CM

## 2019-05-31 DIAGNOSIS — C61 Malignant neoplasm of prostate: Secondary | ICD-10-CM

## 2019-06-20 ENCOUNTER — Institutional Professional Consult (permissible substitution): Admit: 2019-06-20 | Discharge: 2019-06-21 | Payer: MEDICARE

## 2019-06-21 MED ORDER — OXYCODONE 5 MG TABLET
ORAL_TABLET | Freq: Two times a day (BID) | ORAL | 0 refills | 30.00000 days | Status: CP | PRN
Start: 2019-06-21 — End: ?

## 2019-06-25 ENCOUNTER — Ambulatory Visit: Admit: 2019-06-25 | Discharge: 2019-07-08 | Payer: MEDICARE

## 2019-06-25 ENCOUNTER — Ambulatory Visit: Admit: 2019-06-25 | Discharge: 2019-07-24 | Payer: MEDICARE

## 2019-06-28 ENCOUNTER — Ambulatory Visit: Admit: 2019-06-28 | Discharge: 2019-06-29 | Payer: MEDICARE | Attending: Medical Oncology | Primary: Medical Oncology

## 2019-06-28 ENCOUNTER — Other Ambulatory Visit: Admit: 2019-06-28 | Discharge: 2019-06-29 | Payer: MEDICARE

## 2019-06-28 DIAGNOSIS — C61 Malignant neoplasm of prostate: Principal | ICD-10-CM

## 2019-06-28 MED ORDER — DAROLUTAMIDE 300 MG TABLET
ORAL_TABLET | Freq: Two times a day (BID) | ORAL | 11 refills | 30 days | Status: CP
Start: 2019-06-28 — End: ?
  Filled 2019-07-24: qty 120, 30d supply, fill #0

## 2019-07-11 NOTE — Unmapped (Signed)
[facility] Specialty Medication Onboarding    Specialty Medication: NUBEQUA   Prior Authorization: Approved   Financial Assistance: No - copay  <$25  Final Copay/Day Supply: $8.95 / 28 DAYS    Insurance Restrictions: None     Notes to Pharmacist:     The triage team has completed the benefits investigation and has determined that the patient is able to fill this medication at [facility]. Please contact the patient to complete the onboarding or follow up with the prescribing physician as needed.

## 2019-07-11 NOTE — Unmapped (Signed)
[facility]   Patient Onboarding/Medication Counseling    Mr.Stephen Bartlett is a 78 y.o. 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  I am speaking to the patient.    Verified patient's date of birth / HIPAA.    Specialty medication(s) to be sent: Hematology/Oncology: Nubeqa      Non-specialty medications/supplies to be sent: none      Medications not needed at this time: none         Nubeqa (Darolutamide)    Medication & Administration     Dosage: Take 2 tablets (600 mg total) by mouth 2 (two) times a day with meals. Take with food. Swallow tablets whole.    Administration:   ? Take with food  ? Swallow whole  ? Take with calcium and vitamin D    Adherence/Missed dose instructions:  ? Take a missed dose as soon as you think about it and go back to your normal time.  ? Do not take 2 doses at the same time or extra doses.    Goals of Therapy     ? Prevent prostate cancer progression    Side Effects & Monitoring Parameters     ? Common side effects  ? Fatigue, loss of strength or energy  ? Pain in arms or legs  ? Rash    ? The following side effects should be reported to the provider:  ? Allergic reaction  ? Infections (fever, chills, cough, sore throat)    Contraindications, Warnings, & Precautions     ? Bone marrow suppression  ? Hepatic impairment - patients with moderate hepatic impairment have increased exposure to the drug. A reduced dose is recommended   ? Renal impairment  - patients with severe renal impairment who are not receiving hemodialysis have increased exposure to the drug. A reduced dose is recommended.  ? Males with male partners of reproductive potential should use effective contraception during treatment and for 1 week after the last dose.    Drug/Food Interactions ? Medication list reviewed in Epic. The patient was instructed to inform the care team before taking any new medications or supplements. No drug interactions identified.   ?   Storage, Handling Precautions, & Disposal     ? Store room temperature  ? Keep away from children and pets  ? This drug is considered hazardous and should be handled as little as possible.  If someone else helps with medication administration, they should wear gloves.    Current Medications (including OTC/herbals), Comorbidities and Allergies     Current Outpatient Medications   Medication Sig Dispense Refill   ??? allopurinoL (ZYLOPRIM) 100 MG tablet Take 1 tablet (100 mg total) by mouth daily. 90 tablet 3   ??? amLODIPine (NORVASC) 5 MG tablet Take 5 mg by mouth daily.      ??? atorvastatin (LIPITOR) 10 MG tablet Take 10 mg by mouth nightly.      ??? darolutamide (NUBEQA) 300 mg tablet Take 2 tablets (600 mg total) by mouth 2 (two) times a day with meals. Take with food. Swallow tablets whole. 120 tablet 11   ??? dexAMETHasone (DECADRON) 2 MG tablet TAKE 1 TABLET(2 MG) BY MOUTH DAILY 90 tablet 3   ??? famotidine (PEPCID) 20 MG tablet TAKE 1 TABLET(20 MG) BY MOUTH DAILY 90 tablet 3   ??? hydroCHLOROthiazide (MICROZIDE) 12.5 mg capsule Take 12.5 mg by mouth daily.     ???  oxyCODONE (ROXICODONE) 5 MG immediate release tablet Take 1 tablet (5 mg total) by mouth every twelve (12) hours as needed for pain. 60 tablet 0   ??? potassium chloride 20 mEq TbER Take 20 mEq by mouth daily.       No current facility-administered medications for this visit.        No Known Allergies    Patient Active Problem List   Diagnosis   ??? Malignant neoplasm of prostate (CMS-HCC)   ??? 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 Hypertension   ??? Calculus of kidney   ??? Acute renal failure (CMS-HCC)   ??? Foreign body in bladder and urethra   ??? Uric acid nephrolithiasis   ??? Renal cyst, acquired, left   ??? Pancreatic mass   ??? Acute pancreatitis   ??? Bronchiectasis (CMS-HCC) ??? Acute kidney injury (CMS-HCC)   ??? Lesion of pancreas   ??? Bone metastases (CMS-HCC)       Reviewed and up to date in Epic.    Appropriateness of Therapy     Is medication and dose appropriate based on diagnosis? 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 0    Financial Information     Medication Assistance provided: Prior Authorization    Anticipated copay of $8.95 / 28 days reviewed with patient. Verified delivery address.    Delivery Information     Scheduled delivery date: 07/25/19    Expected start date: 07/25/19    Medication will be delivered via Next Day Courier to the prescription address in [facility].  This shipment will not require a signature.      Explained the services we provide at [facility] and that each month we would call to set up refills.  Stressed importance of returning phone calls so that we could ensure they receive their medications in time each month.  Informed patient that we should be setting up refills 7-10 days prior to when they will run out of medication.  A pharmacist will reach out to perform a clinical assessment periodically.  Informed patient that a welcome packet and a drug information handout will be sent.      Patient verbalized understanding of the above information as well as how to contact the pharmacy at [phone] option [phone] with any questions/concerns.  The pharmacy is open Monday through Friday 8:30am-4:30pm.  A pharmacist is available 24/7 via pager to answer any clinical questions they may have.    Patient Specific Needs     ? Does the patient have any physical, cognitive, or cultural barriers? No    ? Patient prefers to have medications discussed with  Patient     ? Is the patient able to read and understand education materials at a high school level or above? No ? Patient's primary language is  English     ? Is the patient high risk? No     ? Does the patient require a Care Management Plan? No     ? Does the patient require physician intervention or other additional services (i.e. nutrition, smoking cessation, social work)? No      Hildreth Orsak [facility] Specialty Pharmacist

## 2019-07-23 ENCOUNTER — Institutional Professional Consult (permissible substitution): Admit: 2019-07-23 | Discharge: 2019-07-24 | Payer: MEDICARE

## 2019-07-23 ENCOUNTER — Inpatient Hospital Stay
Admission: EM | Admit: 2019-07-23 | Discharge: 2019-07-25 | DRG: 871 | Disposition: A | Discharge status: Other Acute Inpatient Hospital | Payer: Medicare HMO | Attending: Internal Medicine | Admitting: Internal Medicine

## 2019-07-23 ENCOUNTER — Other Ambulatory Visit: Payer: Self-pay

## 2019-07-23 ENCOUNTER — Emergency Department: Payer: Medicare HMO

## 2019-07-23 DIAGNOSIS — Z7952 Long term (current) use of systemic steroids: Secondary | ICD-10-CM

## 2019-07-23 DIAGNOSIS — Z8546 Personal history of malignant neoplasm of prostate: Secondary | ICD-10-CM

## 2019-07-23 DIAGNOSIS — I10 Essential (primary) hypertension: Secondary | ICD-10-CM | POA: Diagnosis present

## 2019-07-23 DIAGNOSIS — J9601 Acute respiratory failure with hypoxia: Secondary | ICD-10-CM | POA: Diagnosis present

## 2019-07-23 DIAGNOSIS — E785 Hyperlipidemia, unspecified: Secondary | ICD-10-CM | POA: Diagnosis present

## 2019-07-23 DIAGNOSIS — U071 COVID-19: Secondary | ICD-10-CM | POA: Diagnosis present

## 2019-07-23 DIAGNOSIS — C61 Malignant neoplasm of prostate: Secondary | ICD-10-CM | POA: Diagnosis not present

## 2019-07-23 DIAGNOSIS — A4189 Other specified sepsis: Secondary | ICD-10-CM | POA: Diagnosis present

## 2019-07-23 DIAGNOSIS — Z79899 Other long term (current) drug therapy: Secondary | ICD-10-CM

## 2019-07-23 DIAGNOSIS — E876 Hypokalemia: Secondary | ICD-10-CM | POA: Diagnosis present

## 2019-07-23 DIAGNOSIS — J1282 Pneumonia due to coronavirus disease 2019: Secondary | ICD-10-CM | POA: Diagnosis present

## 2019-07-23 DIAGNOSIS — J1289 Other viral pneumonia: Secondary | ICD-10-CM | POA: Diagnosis present

## 2019-07-23 HISTORY — DX: Hyperlipidemia, unspecified: E78.5

## 2019-07-23 HISTORY — DX: Essential (primary) hypertension: I10

## 2019-07-23 HISTORY — DX: Inflammatory disease of prostate, unspecified: N41.9

## 2019-07-23 LAB — ABO/RH: ABO/RH(D): B POS

## 2019-07-23 LAB — LACTATE DEHYDROGENASE: LDH: 338 U/L — ABNORMAL HIGH (ref 98–192)

## 2019-07-23 LAB — URINALYSIS, COMPLETE (UACMP) WITH MICROSCOPIC
Bilirubin Urine: NEGATIVE
Glucose, UA: NEGATIVE mg/dL
Ketones, ur: NEGATIVE mg/dL
Leukocytes,Ua: NEGATIVE
Nitrite: NEGATIVE
Protein, ur: NEGATIVE mg/dL
Specific Gravity, Urine: 1.014 (ref 1.005–1.030)
pH: 5 (ref 5.0–8.0)

## 2019-07-23 LAB — PROCALCITONIN
Procalcitonin: 5.36 ng/mL
Procalcitonin: 7.22 ng/mL

## 2019-07-23 LAB — INFLUENZA PANEL BY PCR (TYPE A & B)
Influenza A By PCR: NEGATIVE
Influenza B By PCR: NEGATIVE

## 2019-07-23 LAB — CBC
HCT: 36 % — ABNORMAL LOW (ref 39.0–52.0)
HCT: 34 % — ABNORMAL LOW (ref 39.0–52.0)
Hemoglobin: 12.4 g/dL — ABNORMAL LOW (ref 13.0–17.0)
Hemoglobin: 11.1 g/dL — ABNORMAL LOW (ref 13.0–17.0)
MCH: 30.4 pg (ref 26.0–34.0)
MCH: 30.3 pg (ref 26.0–34.0)
MCHC: 34.4 g/dL (ref 30.0–36.0)
MCHC: 32.6 g/dL (ref 30.0–36.0)
MCV: 88.2 fL (ref 80.0–100.0)
MCV: 92.9 fL (ref 80.0–100.0)
Platelets: 166 10*3/uL (ref 150–400)
Platelets: 140 10*3/uL — ABNORMAL LOW (ref 150–400)
RBC: 4.08 MIL/uL — ABNORMAL LOW (ref 4.22–5.81)
RBC: 3.66 MIL/uL — ABNORMAL LOW (ref 4.22–5.81)
RDW: 14.5 % (ref 11.5–15.5)
RDW: 14.5 % (ref 11.5–15.5)
WBC: 13.2 10*3/uL — ABNORMAL HIGH (ref 4.0–10.5)
WBC: 13.2 10*3/uL — ABNORMAL HIGH (ref 4.0–10.5)
nRBC: 0 % (ref 0.0–0.2)
nRBC: 0 % (ref 0.0–0.2)

## 2019-07-23 LAB — BASIC METABOLIC PANEL
Anion gap: 16 — ABNORMAL HIGH (ref 5–15)
BUN: 30 mg/dL — ABNORMAL HIGH (ref 8–23)
CO2: 25 mmol/L (ref 22–32)
Calcium: 8.2 mg/dL — ABNORMAL LOW (ref 8.9–10.3)
Chloride: 96 mmol/L — ABNORMAL LOW (ref 98–111)
Creatinine, Ser: 1.43 mg/dL — ABNORMAL HIGH (ref 0.61–1.24)
GFR calc Af Amer: 54 mL/min — ABNORMAL LOW (ref >60)
GFR calc non Af Amer: 47 mL/min — ABNORMAL LOW (ref >60)
Glucose, Bld: 96 mg/dL (ref 70–99)
Potassium: 2.6 mmol/L — CL (ref 3.5–5.1)
Sodium: 137 mmol/L (ref 135–145)

## 2019-07-23 LAB — LACTIC ACID, PLASMA
Lactic Acid, Venous: 1.9 mmol/L (ref 0.5–1.9)
Lactic Acid, Venous: 1.1 mmol/L (ref 0.5–1.9)

## 2019-07-23 LAB — HEPATIC FUNCTION PANEL
ALT: 20 U/L (ref 0–44)
AST: 38 U/L (ref 15–41)
Albumin: 3.2 g/dL — ABNORMAL LOW (ref 3.5–5.0)
Alkaline Phosphatase: 60 U/L (ref 38–126)
Bilirubin, Direct: 0.3 mg/dL — ABNORMAL HIGH (ref 0.0–0.2)
Indirect Bilirubin: 0.7 mg/dL (ref 0.3–0.9)
Total Bilirubin: 1 mg/dL (ref 0.3–1.2)
Total Protein: 7.3 g/dL (ref 6.5–8.1)

## 2019-07-23 LAB — FIBRINOGEN: Fibrinogen: 651 mg/dL — ABNORMAL HIGH (ref 210–475)

## 2019-07-23 LAB — LIPASE, BLOOD: Lipase: 53 U/L — ABNORMAL HIGH (ref 11–51)

## 2019-07-23 LAB — CREATININE, SERUM
Creatinine, Ser: 1.24 mg/dL (ref 0.61–1.24)
GFR calc Af Amer: >60 mL/min (ref >60)
GFR calc non Af Amer: 56 mL/min — ABNORMAL LOW (ref >60)

## 2019-07-23 LAB — POC SARS CORONAVIRUS 2 AG: SARS Coronavirus 2 Ag: POSITIVE — AB

## 2019-07-23 LAB — FERRITIN: Ferritin: 266 ng/mL (ref 24–336)

## 2019-07-23 LAB — FIBRIN DERIVATIVES D-DIMER (ARMC ONLY): Fibrin derivatives D-dimer (ARMC): 1079.91 ng/mL (FEU) — ABNORMAL HIGH (ref 0.00–499.00)

## 2019-07-23 MED ORDER — SODIUM CHLORIDE 0.9 % IV SOLN
2.0000 g | INTRAVENOUS | Status: DC
  Administered 2019-07-23 – 2019-07-24 (×2): 2 g via INTRAVENOUS
  Filled 2019-07-23 (×2): qty 20

## 2019-07-23 MED ORDER — IPRATROPIUM-ALBUTEROL 20-100 MCG/ACT IN AERS
1.0000 | INHALATION_SPRAY | Freq: Four times a day (QID) | RESPIRATORY_TRACT | Status: DC
  Administered 2019-07-23 – 2019-07-25 (×6): 1 via RESPIRATORY_TRACT
  Filled 2019-07-23: qty 4

## 2019-07-23 MED ORDER — ONDANSETRON HCL 4 MG/2ML IJ SOLN: 4.0000 mg | Freq: Four times a day (QID) | INTRAMUSCULAR | Status: DC | PRN

## 2019-07-23 MED ORDER — POTASSIUM CHLORIDE CRYS ER 20 MEQ PO TBCR
40.0000 meq | EXTENDED_RELEASE_TABLET | Freq: Once | ORAL | Status: AC
  Administered 2019-07-23: 40 meq via ORAL
  Filled 2019-07-23: qty 2

## 2019-07-23 MED ORDER — ACETAMINOPHEN 325 MG PO TABS: 650.0000 mg | ORAL_TABLET | Freq: Four times a day (QID) | ORAL | Status: DC | PRN

## 2019-07-23 MED ORDER — ACETAMINOPHEN 325 MG PO TABS
650.0000 mg | ORAL_TABLET | Freq: Once | ORAL | Status: AC | PRN
  Administered 2019-07-23: 650 mg via ORAL
  Filled 2019-07-23: qty 2

## 2019-07-23 MED ORDER — SODIUM CHLORIDE 0.9 % IV SOLN
200.0000 mg | Freq: Once | INTRAVENOUS | Status: AC
  Administered 2019-07-23: 200 mg via INTRAVENOUS
  Filled 2019-07-23: qty 40

## 2019-07-23 MED ORDER — DAROLUTAMIDE 300 MG PO TABS: 600.0000 mg | ORAL_TABLET | Freq: Two times a day (BID) | ORAL | Status: DC

## 2019-07-23 MED ORDER — DEXAMETHASONE SODIUM PHOSPHATE 10 MG/ML IJ SOLN
10.0000 mg | Freq: Once | INTRAMUSCULAR | Status: AC
  Administered 2019-07-23: 10 mg via INTRAVENOUS
  Filled 2019-07-23: qty 1

## 2019-07-23 MED ORDER — METHYLPREDNISOLONE SODIUM SUCC 40 MG IJ SOLR
0.5000 mg/kg | Freq: Two times a day (BID) | INTRAMUSCULAR | Status: DC
  Administered 2019-07-23 – 2019-07-24 (×3): 38.4 mg via INTRAVENOUS
  Filled 2019-07-23 (×3): qty 1

## 2019-07-23 MED ORDER — SODIUM CHLORIDE 0.9 % IV SOLN
100.0000 mg | Freq: Every day | INTRAVENOUS | Status: DC
  Administered 2019-07-24: 100 mg via INTRAVENOUS
  Filled 2019-07-23: qty 20

## 2019-07-23 MED ORDER — SODIUM CHLORIDE 0.9 % IV SOLN
500.0000 mg | INTRAVENOUS | Status: DC
  Administered 2019-07-23 – 2019-07-24 (×2): 500 mg via INTRAVENOUS
  Filled 2019-07-23 (×2): qty 500

## 2019-07-23 MED ORDER — SODIUM CHLORIDE 0.9 % IV BOLUS (SEPSIS)
1000.0000 mL | Freq: Once | INTRAVENOUS | Status: AC
  Administered 2019-07-23: 1000 mL via INTRAVENOUS

## 2019-07-23 MED ORDER — SODIUM CHLORIDE 0.9% FLUSH: 3.0000 mL | Freq: Once | INTRAVENOUS | Status: DC

## 2019-07-23 MED ORDER — HEPARIN SODIUM (PORCINE) 5000 UNIT/ML IJ SOLN
5000.0000 [IU] | Freq: Three times a day (TID) | INTRAMUSCULAR | Status: DC
  Administered 2019-07-23 – 2019-07-24 (×3): 5000 [IU] via SUBCUTANEOUS
  Filled 2019-07-23 (×4): qty 1

## 2019-07-23 MED ORDER — OXYCODONE HCL 5 MG PO TABS: 5.0000 mg | ORAL_TABLET | ORAL | Status: DC | PRN

## 2019-07-23 MED ORDER — GUAIFENESIN-DM 100-10 MG/5ML PO SYRP
10.0000 mL | ORAL_SOLUTION | ORAL | Status: DC | PRN
  Filled 2019-07-23: qty 10

## 2019-07-23 MED ORDER — SODIUM CHLORIDE 0.9 % IV SOLN
Freq: Once | INTRAVENOUS | Status: AC
  Administered 2019-07-24: 02:00:00 via INTRAVENOUS

## 2019-07-23 MED ORDER — ONDANSETRON HCL 4 MG PO TABS: 4.0000 mg | ORAL_TABLET | Freq: Four times a day (QID) | ORAL | Status: DC | PRN

## 2019-07-23 NOTE — Unmapped (Signed)
OUTPATIENT ONCOLOGY PALLIATIVE CARE    Principal Diagnosis: [name] is a 78 y.o. 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     Assessment/Plan:     1. Concern for COVID-19 - fatigue and debility  - Wife confirmed positive (has completed 1 week of quarantine)  - Extreme fatigue - unable to complete phone visit and fell on 11/29  - New diarrhea, worsened cough, and generalized body aches  - Tested 11/29 for COVID-19 at [facility] and is awaiting results  - S/p fall 11/29, requires EMS assistance due to inability to get off of floor independently  - Concerned about dehydration and poor PO intake  - Spoke with son this afternoon, able to arrange for EMS to take patient to local hospital for work-up    2.  Upper back pain due to bone mets from prostate cancer, well controlled with current regimen  - Continue Decadron 2 mg and pepcid 20 mg qd  - Continue with oxycodone 5 mg every 12 hours as needed pain (taking 2x/day)    3.  Advanced care planning - addressed at prior visit  - At prior visits: NP did discuss that in the state of [place], his wife will be his agent unless he does get this in writing.  - Patient wants his son to be his primary agent, but he does not have advanced care planning documents.  His secondary agent would be his spouse.  - Encouraged patient to reach out to his son, who is a Clinical research associate for this documentation.  - Patient does not have a cell phone or computer at home, so it is difficult to share information.    # Controlled substances risk management.   - Patient does not have a signed pain medication agreement with our team, but this was discussed verbally.   - [place] database was reviewed today and it was appropriate.   - Urine drug screen was not performed at this visit. Findings: not applicable.   - Patient has received information about safe storage and administration of medications.   - Patient has not received a prescription for narcan. F/u: Son planning to call NP before the end of the day to discuss POC given patient's acute decline.   ----------------------------------------  Referring Provider: Dr. Vernell Bartlett  Oncology Team: Dr. 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  In January 2019, patient had cord compression and subsequent surgery & XRT with a rod from C7-T8.  His prior tx have been Radium 223, Avodart/Casodex and abiraterone/Lupron. Restaging scans 3/20 (post-Redium) are stable/slightly improved. He is also being followed for a neuroendocrine tumor grade 1 pancreatic mass.     Patient's primary symptom is his upper back pain which is exacerbated by changes in the weather.  He reports that the pain is currently at 7/10, but he has not taken the a.m. ibuprofen 600 mg and the Decadron 2 mg.  He does find some relief with this.  Has some difficulty using the pain scores.  Feels that he still not getting adequate amount of relief.  He describes the pain as a numbing and aching sensation.  He had tried Tylenol in the past with no relief. Does endorse history of marijuana use.  No other illicit drug use and opioid risk assessment tool was done today.    Patient shared that when he had his spine surgery he could not walk and it  took much time and energy for him to regain his strength and walk again. He still drives and is able to do some cooking in the home.  He is independent with his personal ADLs.    Current cancer-directed therapy: ADT- Eligard 45 (6 month injection). In the future, planning to start Darolutamide. Continuing with Eligard injections every 6 months. Next injection scheduled for January '21.     Interval history, 07/23/2019: [facility]    11/2: CT of abdomen/pelvis and NM whole body bone scan with no new findings - showed stable disease. 11/30: Patient acutely fatigued and seemingly very frail over phone visit today. Reported a fall overnight on 11/29. Larey Seat out of the chair onto the floor and was unable to get up - wife too weak to help him. Remained on the floor until EMS was able to move him from floor to bed. Unable to answer all questions during his appointment as he didn't feel like talking and ended the visit early. Very concerned for patient's acute decline in functional mobility with the onset of many new symptoms (see descriptions below). Patient's wife closed out the visit and was adamant to NOT call 911. Did receive permission to speak with patient's son, Stephen Bartlett. Voicemail left with patient's son voicing concern over his dad's current state. Son called back quickly and NP expressed concern over his dad needing hospitalization for evaluation and treatment. Found out through this conversation that patient's wife tested positive for COVID-19. She has been quarantining x1 week at home with her husband. Patient was COVID tested on 11/29 and is waiting for his results. Planning for son to call his dad to arrange a ride to the hospital, most likely through EMS. Son called back this afternoon and reported that EMS has taken his father to a local hospital in [facility] for further work-up and treatment. Largely concerned for COVID-19 given known exposure and that patient is now symptomatic.     Symptom Review:  General: reports feeling cold with new onset of extreme fatigue, fell on 11/29 where he laid on the floor all night, denies fevers  Pain: c/o new generalized body aches that started within the last week; chronic back pain well controlled with current regimen, taking PRN oxy 2x/day  Fatigue: extreme, unable to complete the visit due to exhaustion from talking  Mobility: unable to ambulate short distances safely, reports some assistance with a walker; s/p fall   Sleep: not assessed at this visit Appetite: only eating a little bit, reports drinking enough water but unable to quantify exactly how much he's drinking, only has a little taste  Bowel function: reports new onset of diarrhea x1 week, happens every time that he eats; minimal relief with Pepto Bismol   Respiratory/Dyspnea: denies any SOB but endorses worsening dry cough   GU: frequent voiding    Palliative Performance Scale: 50% - Ambulation: Mainly sit/lie / Unable to do any work, extensive disease / Self-Care:Considerable assistance required, Intake: Normal or reduced / Level of Conscious: Full or confusion    Coping/Support Issues: Has the support of his spiritual practice.  He had been attending the [employer] on a regular basis prior to  COVID-19.  His most supportive people are his son, who is a Clinical research associate in [place] and his sister who is a Runner, broadcasting/film/video in [place].  He also has a sister who is a retired Charity fundraiser that lives about 1 hour away.  He does live with his wife since 79.  It is a second marriage  for him.    Goals of Care: To feel better    Social History:   Name of primary support: Son, wife and sister  Occupation: Retired.  He had worked in the [employer]  Hobbies: Enjoys visiting family and sitting on the front porch.  Current residence / distance from [facility]: lives in [place]    Advance Care Planning:   HCPOA:  Natural surrogate decision maker: His son would be his primary agent and his wife would be the secondary  Living Will: No  ACP note:   CODE STATUS: Full    Objective     Opioid Risk Tool:    Male  Male    Family history of substance abuse      Alcohol  1  3    Illegal drugs  2  3    Rx drugs  4  4    Personal history of substance abuse      Alcohol  3  3    Illegal drugs  4  4    Rx drugs  5  5    Age between 84???45 years  1  1    History of preadolescent sexual abuse  3  0    Psychological disease      ADD, OCD, bipolar, schizophrenia  2  2    Depression  1  1       Total: 0 (<3 low risk, 4-7 moderate risk, >8 high risk)    Oncology History   Malignant neoplasm of prostate (CMS-HCC)   09/30/2011 Initial Diagnosis    Malignant neoplasm of prostate (CMS-HCC)     11/23/2018 Endocrine/Hormone Therapy    OP LEUPROLIDE (ELIGARD) 45 MG EVERY 6 MONTHS  Plan Provider: Chrisandra Netters, MD     Bone metastases (CMS-HCC)   08/24/2017 Initial Diagnosis    Bone metastases (CMS-HCC)     11/23/2018 Endocrine/Hormone Therapy    OP LEUPROLIDE (ELIGARD) 45 MG EVERY 6 MONTHS  Plan Provider: Chrisandra Netters, MD         Patient Active Problem List   Diagnosis   ??? Malignant neoplasm of prostate (CMS-HCC)   ??? 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 Hypertension   ??? Calculus of kidney   ??? Acute renal failure (CMS-HCC)   ??? Foreign body in bladder and urethra   ??? Uric acid nephrolithiasis   ??? Renal cyst, acquired, left   ??? Pancreatic mass   ??? Acute pancreatitis   ??? Bronchiectasis (CMS-HCC)   ??? Acute kidney injury (CMS-HCC)   ??? Lesion of pancreas   ??? Bone metastases (CMS-HCC)       Past Medical History:   Diagnosis Date   ??? Calculus of kidney    ??? Hypertension    ??? Malignant neoplasm of prostate (CMS-HCC)        Past Surgical History:   Procedure Laterality Date   ??? PR ARTHRODESIS POSTERIOR/POSTERIORLATERAL CERVICAL BELOW C2 Midline 08/05/2017    Procedure: ARTHRODESIS, POSTERIOR OR POSTEROLATERAL TECHNIQUE, SINGLE LEVEL; CERVICAL BELOW C2 SEGMENT;  Surgeon: Timothy Lasso, MD;  Location: MAIN OR [facility];  Service: Ortho Spine   ??? PR ARTHRODESIS POSTERIOR/POSTEROLATERAL EA ADDL Midline 08/05/2017    Procedure: ARTHRODESIS, POSTERIOR OR POSTEROLATERAL TECHNIQUE, SINGLE LEVEL; EACH ADDITIONAL VERTEBRAL SEGMENT;  Surgeon: Timothy Lasso, MD;  Location: MAIN OR [facility];  Service: Ortho Spine   ??? PR LAMINEC/FACETECT/FORAMIN,CERVICAL 1 SEG Midline 08/05/2017    Procedure: LAMINECTOMY SNGL VERTEBRAL SEGMT-UNI/BIL; CERV;  Surgeon:  Timothy Lasso, MD;  Location: MA[facility];  Service: Ortho Spine ??? PR LAMINEC/FACETECT/FORAMIN,EACH ADDNL Midline 08/05/2017    Procedure: LAMINECTMY 1 SEGMT-UNI/BIL; EA ADD CERV/THOR/LUM;  Surgeon: Timothy Lasso, MD;  Location: MAIN OR [facility];  Service: Ortho Spine   ??? PR POSTERIOR SEGMENTAL INSTRUMENTATION 7-12 VRT SEG Midline 08/05/2017    Procedure: POSTERIOR SEGMENTAL INSTRUMENTATION; (EG, PEDICLE FIXATION, DUAL RODS W/MULT HOOKS/WIRES) 7-12 VERTEB SEGMT;  Surgeon: Timothy Lasso, MD;  Location: MAIN OR [facility];  Service: Ortho Spine   ??? PR UPGI ENDOSCOPY,FN NEEDLE BX,GUIDED N/A 02/11/2017    Procedure: UGI W/TRANSENDOSCOPIC US-GUIDE INTRA/TRANSMURAL NEEDLE ASP/BX (INCL EXAM ESOPHAGUS, STOMACH, DOUDENUM/JEJ);  Surgeon: Vonda Antigua, MD;  [facility];  Service: Gastroenterology   ??? URETEROSCOPY         Current Outpatient Medications   Medication Sig Dispense Refill   ??? allopurinoL (ZYLOPRIM) 100 MG tablet Take 1 tablet (100 mg total) by mouth daily. 90 tablet 3   ??? amLODIPine (NORVASC) 5 MG tablet Take 5 mg by mouth daily.      ??? atorvastatin (LIPITOR) 10 MG tablet Take 10 mg by mouth nightly.      ??? darolutamide (NUBEQA) 300 mg tablet Take 2 tablets (600 mg total) by mouth 2 (two) times a day with meals. Take with food. Swallow tablets whole. 120 tablet 11   ??? dexAMETHasone (DECADRON) 2 MG tablet TAKE 1 TABLET(2 MG) BY MOUTH DAILY 90 tablet 3   ??? famotidine (PEPCID) 20 MG tablet TAKE 1 TABLET(20 MG) BY MOUTH DAILY 90 tablet 3   ??? hydroCHLOROthiazide (MICROZIDE) 12.5 mg capsule Take 12.5 mg by mouth daily.     ??? oxyCODONE (ROXICODONE) 5 MG immediate release tablet Take 1 tablet (5 mg total) by mouth every twelve (12) hours as needed for pain. 60 tablet 0   ??? potassium chloride 20 mEq TbER Take 20 mEq by mouth daily.       No current facility-administered medications for this visit.        Allergies: No Known Allergies    Family History: Cancer-related family history includes Prostate cancer in an other family member. There is no history of Kidney cancer.  He indicated that the status of his mother is unknown. He indicated that the status of his neg hx is unknown. He indicated that the status of his other is unknown.      Lab Results   Component Value Date    CREATININE 0.91 06/28/2019     Lab Results   Component Value Date    ALKPHOS 66 06/28/2019    BILITOT 0.6 06/28/2019    PROT 6.8 06/28/2019    ALBUMIN 3.9 06/28/2019    ALT 22 06/28/2019    AST 23 06/28/2019        Katie Rana Snare Abdullahi Vallone, BSN, RN, OCN, APP Student  [employer]  Nurse Practitioner Student    I spent 30 minutes on the phone with the patient and his son. I spent an additional 15 minutes on pre- and post-visit activities.     The patient was physically located in [place] or a state in which I am permitted to provide care. The patient and/or parent/gauardian understood that s/he may incur co-pays and cost sharing, and agreed to the telemedicine visit. The visit was completed via phone and/or video, which was appropriate and reasonable under the circumstances given the patient's presentation at the time.    The patient and/or parent/guardian has been  advised of the potential risks and limitations of this mode of treatment (including, but not limited to, the absence of in-person examination) and has agreed to be treated using telemedicine. The patient's/patient's family's questions regarding telemedicine have been answered.     If the phone/video visit was completed in an ambulatory setting, the patient and/or parent/guardian has also been advised to contact their provider???s office for worsening conditions, and seek emergency medical treatment and/or call 911 if the patient deems either necessary.

## 2019-07-23 NOTE — ED Notes (Signed)
Called pharmacy to send meds 

## 2019-07-23 NOTE — Consult Note (Signed)
CODE SEPSIS - PHARMACY COMMUNICATION  **Broad Spectrum Antibiotics should be administered within 1 hour of Sepsis diagnosis**  Time Code Sepsis Called/Page Received: 1351  Antibiotics Ordered: ceftriaxone and azithro  Time of 1st antibiotic administration: 1459  Additional action taken by pharmacy: Messaged RN   If necessary, Name of Provider/Nurse Contacted: Talmadge Chad ,PharmD Clinical Pharmacist  07/23/2019  3:08 PM

## 2019-07-23 NOTE — ED Notes (Signed)
Called bloodbank, FFP not yet to facility

## 2019-07-23 NOTE — ED Provider Notes (Signed)
[facility] Emergency Department Provider Note  ____________________________________________  Time seen: Approximately 2:45 PM  I have reviewed the triage vital signs and the nursing notes.   HISTORY  Chief Complaint Weakness    HPI [name] [name] is a 78 y.o. male with a history of hypertension hyperlipidemia who comes the ED due to generalized weakness.  Reports that recently his wife was diagnosed with Covid within the last week or 2.  Over the past several days he has had generalized weakness that has been progressive to the point that last night he slept in the car because he felt too weak to get out of the car.  He also complains of shortness of breath and nonproductive cough.  No vomiting diarrhea.  He does have fever chills and body aches as well.  Symptoms are waxing and waning without aggravating or alleviating factors.  Constant.      Past Medical History:  Diagnosis Date  . Hyperlipidemia   . Hypertension   . Prostatitis      There are no active problems to display for this patient.    Past Surgical History:  Procedure Laterality Date  . BACK SURGERY       Prior to Admission medications   Not on File     Allergies Patient has no known allergies.   No family history on file.  Social History Social History   Tobacco Use  . Smoking status: Never Smoker  . Smokeless tobacco: Never Used  Substance Use Topics  . Alcohol use: Yes  . Drug use: Not Currently    Review of Systems  Constitutional:   Positive fever and chills.  ENT:   No sore throat. No rhinorrhea. Cardiovascular:   No chest pain or syncope. Respiratory: Positive shortness of breath and cough. Gastrointestinal:   Negative for abdominal pain, vomiting and diarrhea.  Musculoskeletal:   Negative for focal pain or swelling All other systems reviewed and are negative except as documented above in ROS and  HPI.  ____________________________________________   PHYSICAL EXAM:  VITAL SIGNS: ED Triage Vitals  Enc Vitals Group     BP 07/23/19 1308 117/66     Pulse Rate 07/23/19 1308 (!) 119     Resp 07/23/19 1308 20     Temp 07/23/19 1308 (!) 101.6 F (38.7 C)     Temp Source 07/23/19 1308 Oral     SpO2 07/23/19 1308 (!) 86 %     Weight 07/23/19 1310 170 lb (77.1 kg)     Height 07/23/19 1310 5\' 7"  (1.702 m)     Head Circumference --      Peak Flow --      Pain Score 07/23/19 1309 0     Pain Loc --      Pain Edu? --      Excl. in [place]? --     Vital signs reviewed, nursing assessments reviewed.   Constitutional:   Alert and oriented.  Ill-appearing. Eyes:   Conjunctivae are normal. EOMI. PERRL. ENT      Head:   Normocephalic and atraumatic.      Nose: Normal      Mouth/Throat: Dry mucous membranes      Neck:   No meningismus. Full ROM. Hematological/Lymphatic/Immunilogical:   No cervical lymphadenopathy. Cardiovascular:   Tachycardia heart rate 120. Symmetric bilateral radial and DP pulses.  No murmurs. Cap refill less than 2 seconds. Respiratory: Mild tachypnea.  Bibasilar crackles.  No wheezing Gastrointestinal:   Soft and nontender.  Non distended. There is no CVA tenderness.  No rebound, rigidity, or guarding.  Musculoskeletal:   Normal range of motion in all extremities. No joint effusions.  No lower extremity tenderness.  No edema. Neurologic:   Normal speech and language.  Motor grossly intact. No acute focal neurologic deficits are appreciated.  Skin:    Skin is warm, dry and intact. No rash noted.  No petechiae, purpura, or bullae.  ____________________________________________    LABS (pertinent positives/negatives) (all labs ordered are listed, but only abnormal results are displayed) Labs Reviewed  BASIC METABOLIC PANEL - Abnormal; Notable for the following components:      Result Value   Potassium 2.6 (*)    Chloride 96 (*)    BUN 30 (*)    Creatinine, Ser  1.43 (*)    Calcium 8.2 (*)    GFR calc non Af Amer 47 (*)    GFR calc Af Amer 54 (*)    Anion gap 16 (*)    All other components within normal limits  CBC - Abnormal; Notable for the following components:   WBC 13.2 (*)    RBC 4.08 (*)    Hemoglobin 12.4 (*)    HCT 36.0 (*)    All other components within normal limits  URINE CULTURE  CULTURE, BLOOD (ROUTINE X 2)  CULTURE, BLOOD (ROUTINE X 2)  LACTIC ACID, PLASMA  URINALYSIS, COMPLETE (UACMP) WITH MICROSCOPIC  LACTIC ACID, PLASMA  LIPASE, BLOOD  PROCALCITONIN  HEPATIC FUNCTION PANEL  CBG MONITORING, ED  POC SARS CORONAVIRUS 2 AG -  ED   ____________________________________________   EKG  Interpreted by me Sinus tachycardia rate 115.  Normal axis and intervals.  There is LVH on the high lateral leads.  Normal ST segments and T waves.  ____________________________________________    [id number]  Dg Chest Port 1 View  Result Date: 07/23/2019 CLINICAL DATA:  Shortness of breath EXAM: PORTABLE CHEST 1 VIEW COMPARISON:  None. FINDINGS: There is patchy airspace opacity in the lung bases. Lungs elsewhere are clear. Heart size and pulmonary vascularity are normal. No adenopathy. There is postoperative change throughout much of the upper and midthoracic region. IMPRESSION: Patchy airspace opacity consistent with pneumonia in the lung bases. Lungs elsewhere clear. Cardiac silhouette normal. No adenopathy. Electronically Signed   By: Lowella Grip III M.D.   On: 07/23/2019 14:12    ____________________________________________   PROCEDURES .Critical Care Performed by: Carrie Mew, MD Authorized by: Carrie Mew, MD   Critical care provider statement:    Critical care time (minutes):  35   Critical care time was exclusive of:  Separately billable procedures and treating other patients   Critical care was necessary to treat or prevent imminent or life-threatening deterioration of the following conditions:   Respiratory failure, sepsis and dehydration   Critical care was time spent personally by me on the following activities:  Development of treatment plan with patient or surrogate, discussions with consultants, evaluation of patient's response to treatment, examination of patient, obtaining history from patient or surrogate, ordering and performing treatments and interventions, ordering and review of laboratory studies, ordering and review of radiographic studies, pulse oximetry, re-evaluation of patient's condition and review of old charts    ____________________________________________  DIFFERENTIAL DIAGNOSIS   Pneumonia, COVID-19, dehydration, electrolyte abnormality, cystitis, sepsis  CLINICAL IMPRESSION / ASSESSMENT AND PLAN / ED COURSE  Medications ordered in the ED: Medications  sodium chloride flush (NS) 0.9 % injection 3 mL (has no administration in time range)  sodium  chloride 0.9 % bolus 1,000 mL (has no administration in time range)  cefTRIAXone (ROCEPHIN) 2 g in sodium chloride 0.9 % 100 mL IVPB (has no administration in time range)  azithromycin (ZITHROMAX) 500 mg in sodium chloride 0.9 % 250 mL IVPB (has no administration in time range)  potassium chloride SA (KLOR-CON) CR tablet 40 mEq (has no administration in time range)  acetaminophen (TYLENOL) tablet 650 mg (650 mg Oral Given 07/23/19 1319)    Pertinent labs & imaging results that were available during my care of the patient were reviewed by me and considered in my medical decision making (see chart for details).  Acea Hoiland was evaluated in Emergency Department on 07/23/2019 for the symptoms described in the history of present illness. He was evaluated in the context of the global COVID-19 pandemic, which necessitated consideration that the patient might be at risk for infection with the SARS-CoV-2 virus that causes COVID-19. Institutional protocols and algorithms that pertain to the evaluation of patients at risk for  COVID-19 are in a state of rapid change based on information released by regulatory bodies including the CDC and federal and state organizations. These policies and algorithms were followed during the patient's care in the ED.   Patient presents with fever tachycardia hypoxia and clinical exam concerning for pneumonia versus Covid pneumonitis.  Code sepsis initiated, give IV fluids for hydration, start ceftriaxone and azithromycin for antibiotic coverage.  Oxygenation is stabilized on 3 L nasal cannula.  Check Covid swab, plan to admit.  Clinical Course as of Jul 22 1448  Mon Jul 23, 2019  1444 Covid positive.   [PS]    Clinical Course User Index [PS] Carrie Mew, MD    ----------------------------------------- 2:48 PM on 07/23/2019 -----------------------------------------  Oral potassium ordered to replace his low potassium level.  We will give a dose of Decadron given the Covid positive diagnoses.   ____________________________________________   FINAL CLINICAL IMPRESSION(S) / ED DIAGNOSES    Final diagnoses:  Acute respiratory failure with hypoxia (HCC)  Hypokalemia  Pneumonia due to COVID-19 virus     ED Discharge Orders    None      Portions of this note were generated with dragon dictation software. Dictation errors may occur despite best attempts at proofreading.   Carrie Mew, MD 07/23/19 [phone]

## 2019-07-23 NOTE — ED Triage Notes (Signed)
Weak all over.  Wife had covid couple weeks ago.  Possible uti.  Per ems  Vss, sat 98, no fever.  117/66, hr124.

## 2019-07-23 NOTE — H&P (Signed)
History and Physical    [name] [name] [id number] DOB: 03-03-1941 DOA: 07/23/2019  PCP: Patient, No Pcp Per  Patient coming from: home  I have personally briefly reviewed patient's old medical records in [place]  Chief Complaint: sob  HPI: [name] [name] is a 78 y.o. male with medical history significant for prostate cancer, hypertension, hyperlipidemia who his wife was recently positive for Covid and has been home.  Patient presents to the ER reporting being short of breath with generalized weakness.  Patient's weakness become so profound that he was too weak to get out of his car last night and slept in the car.  He has had a dry cough and dyspnea.  He denies any GI symptoms but did report fevers chills and body aches.  In the ER patient was antigen positive for Covid and consulted to the hospital service for further eval and treatment.  ED Course: The ER patient was found to be febrile to 101.6 mildly tachycardic, respirations in the 20s blood pressure 111/60 initially satting 86% on room air now 97% on 4 L.  Treated with ceftriaxone and azithromycin and noted to be antigen positive for Covid by ER report.  Apparently the antigen does not flow through to the epic results field.  Subsequently evaluated the patient reviewed chest x-ray which showed bilateral pneumonia with hypoxia with antigen positive and patient was admitted under Covid protocol.  Review of Systems: As per HPI otherwise 10 point review of systems done notable for dyspnea, generalized weakness all other reviewed and are negative Past Medical History:  Diagnosis Date  . Hyperlipidemia   . Hypertension   . Prostatitis     Past Surgical History:  Procedure Laterality Date  . BACK SURGERY       reports that he has never smoked. He has never used smokeless tobacco. He reports current alcohol use. He reports previous drug use.  No Known Allergies  No family history on file.   Prior to Admission medications    Medication Sig Start Date End Date Taking? Authorizing Provider  allopurinol (ZYLOPRIM) 100 MG tablet Take 100 mg by mouth daily. 06/01/19  Yes [provider]  amLODipine (NORVASC) 5 MG tablet Take 5 mg by mouth daily. 05/03/19  Yes [provider]  atorvastatin (LIPITOR) 10 MG tablet Take 10 mg by mouth daily. 07/22/19  Yes [provider]  dexamethasone (DECADRON) 2 MG tablet Take 2 mg by mouth daily. 05/03/19  Yes [provider]  famotidine (PEPCID) 20 MG tablet Take 20 mg by mouth daily. 05/02/19  Yes [provider]  hydrochlorothiazide (MICROZIDE) 12.5 MG capsule Take 12.5 mg by mouth daily. 06/18/19  Yes [provider]  oxyCODONE (OXY IR/ROXICODONE) 5 MG immediate release tablet Take 5 mg by mouth 2 (two) times daily as needed for pain. 06/21/19  Yes [provider]  Potassium Chloride ER 20 MEQ TBCR Take 20 mEq by mouth daily. 07/22/19  Yes [provider]  darolutamide (NUBEQA) 300 MG tablet Take 600 mg by mouth 2 (two) times daily. 06/28/19   [provider]    Physical Exam: Vitals:   07/23/19 1445 07/23/19 1459 07/23/19 1500 07/23/19 1530  BP:   110/65 111/60  Pulse: 100  100 91  Resp: (!) 21  20 (!) 24  Temp:      TempSrc:      SpO2: 97% 92% 93% 97%  Weight:      Height:        Constitutional:  NAD, calm, comfortable Vitals:   07/23/19 1445 07/23/19 1459 07/23/19 1500 07/23/19 1530  BP:   110/65 111/60  Pulse: 100  100 91  Resp: (!) 21  20 (!) 24  Temp:      TempSrc:      SpO2: 97% 92% 93% 97%  Weight:      Height:       Eyes: PERRL, lids and conjunctivae normal ENMT: Mucous membranes are moist. Posterior pharynx clear of any exudate or lesions.Normal dentition.  Neck: normal, supple, no masses, no thyromegaly Respiratory: Rhonchi bilaterally, rales bilaterally, mildly tachypneic Cardiovascular: Regular rate and rhythm, no murmurs / rubs / gallops. No extremity edema. 2+ pedal pulses.  No carotid bruits.  Abdomen: no tenderness, no masses palpated. No hepatosplenomegaly. Bowel sounds positive.  Musculoskeletal: no clubbing / cyanosis. No joint deformity upper and lower extremities. Good ROM, no contractures. Normal muscle tone.  Skin: no rashes, lesions, ulcers. No induration Neurologic: CN 2-12 grossly intact. Sensation intact, DTR normal. Strength 5/5 in all 4.  Psychiatric: Normal judgment and insight. Alert and oriented x 3. Normal mood.    Labs on Admission: I have personally reviewed following labs and imaging studies  CBC: Recent Labs  Lab 07/23/19 1320  WBC 13.2*  HGB 12.4*  HCT 36.0*  MCV 88.2  PLT [id number]   Basic Metabolic Panel: Recent Labs  Lab 07/23/19 1320  NA 137  K 2.6*  CL 96*  CO2 25  GLUCOSE 96  BUN 30*  CREATININE 1.43*  CALCIUM 8.2*   GFR: Estimated Creatinine Clearance: 40.4 mL/min (A) (by C-G formula based on SCr of 1.43 mg/dL (H)). Liver Function Tests: Recent Labs  Lab 07/23/19 1324  AST 38  ALT 20  ALKPHOS 60  BILITOT 1.0  PROT 7.3  ALBUMIN 3.2*   Recent Labs  Lab 07/23/19 1324  LIPASE 53*   No results for input(s): AMMONIA in the last 168 hours. Coagulation Profile: No results for input(s): INR, PROTIME in the last 168 hours. Cardiac Enzymes: No results for input(s): CKTOTAL, CKMB, CKMBINDEX, TROPONINI in the last 168 hours. BNP (last 3 results) No results for input(s): PROBNP in the last 8760 hours. HbA1C: No results for input(s): HGBA1C in the last 72 hours. CBG: No results for input(s): GLUCAP in the last 168 hours. Lipid Profile: No results for input(s): CHOL, HDL, LDLCALC, TRIG, CHOLHDL, LDLDIRECT in the last 72 hours. Thyroid Function Tests: No results for input(s): TSH, T4TOTAL, FREET4, T3FREE, THYROIDAB in the last 72 hours. Anemia Panel: No results for input(s): VITAMINB12, FOLATE, FERRITIN, TIBC, IRON, RETICCTPCT in the last 72 hours. Urine analysis:    Component Value Date/Time   COLORURINE  Amber 11/06/2013 1528   APPEARANCEUR Cloudy 11/06/2013 1528   LABSPEC 1.010 11/06/2013 1528   PHURINE 5.0 11/06/2013 1528   GLUCOSEU Negative 11/06/2013 1528   HGBUR 3+ 11/06/2013 1528   BILIRUBINUR Negative 11/06/2013 1528   KETONESUR Negative 11/06/2013 1528   PROTEINUR 100 mg/dL 11/06/2013 1528   NITRITE Negative 11/06/2013 1528   LEUKOCYTESUR Trace 11/06/2013 1528    Radiological Exams on Admission: Dg Chest Port 1 View  Result Date: 07/23/2019 CLINICAL DATA:  Shortness of breath EXAM: PORTABLE CHEST 1 VIEW COMPARISON:  None. FINDINGS: There is patchy airspace opacity in the lung bases. Lungs elsewhere are clear. Heart size and pulmonary vascularity are normal. No adenopathy. There is postoperative change throughout much of the upper and midthoracic region. IMPRESSION: Patchy airspace opacity consistent with pneumonia in the lung bases. Lungs elsewhere  clear. Cardiac silhouette normal. No adenopathy. Electronically Signed   By: Lowella Grip III M.D.   On: 07/23/2019 14:12    EKG: Independently reviewed.  Sinus tachycardia no acute ST changes.  Assessment/Plan Active Problems:   COVID-19  Pneumonia Hypertension Hyperlipidemia Prostate cancer Hypokalemia  COVID-19 with bilateral pneumonia and sepsis. -Admitted under Covid protocol -Continue current antibiotics -Check CRP, hypertension, LDH, CRP, procalcitonin. -Repeat LFTs in the morning currently stable -Started on steroids per protocol -Ordered convalescent plasma if available-discussed with patient was in agreement with plan -Currently on 4 L without respiratory distress - lactic acid 1.9 Hypokalemia: -Repleted in ER recheck in the morning  Hypertension: -Given patient's infection can hold his blood pressure meds tonight monitor  Hyperlipidemia -Patient can resume his home statin on discharge  Prostate cancer: -I did continue patient's home medications  DVT prophylaxis: Heparin SQ  Code Status: Full code  Family Communication: None at bedside Disposition Plan:   Completed inpatient requiring IV steroids, IV antibiotics, aggressive care for Covid infection. Consults called: None Admission status: Inpatient   Nicolette Bang MD [facility]   If 7PM-7AM, please contact night-coverage   07/23/2019, 4:10 PM

## 2019-07-23 NOTE — ED Triage Notes (Signed)
Pt comes into the ED via EMS from  Home, pt states he was so weak he slept in the car, was not able to get out on his own. Pt wife recenlty dx with covid. States he has had a cough.

## 2019-07-24 LAB — RESPIRATORY PANEL BY PCR

## 2019-07-24 LAB — COMPREHENSIVE METABOLIC PANEL
ALT: 19 U/L (ref 0–44)
AST: 38 U/L (ref 15–41)
Albumin: 2.8 g/dL — ABNORMAL LOW (ref 3.5–5.0)
Alkaline Phosphatase: 49 U/L (ref 38–126)
Anion gap: 15 (ref 5–15)
BUN: 24 mg/dL — ABNORMAL HIGH (ref 8–23)
CO2: 23 mmol/L (ref 22–32)
Calcium: 8 mg/dL — ABNORMAL LOW (ref 8.9–10.3)
Chloride: 102 mmol/L (ref 98–111)
Creatinine, Ser: 0.93 mg/dL (ref 0.61–1.24)
GFR calc Af Amer: >60 mL/min (ref >60)
GFR calc non Af Amer: >60 mL/min (ref >60)
Glucose, Bld: 93 mg/dL (ref 70–99)
Potassium: 3.2 mmol/L — ABNORMAL LOW (ref 3.5–5.1)
Sodium: 140 mmol/L (ref 135–145)
Total Bilirubin: 0.7 mg/dL (ref 0.3–1.2)
Total Protein: 6.7 g/dL (ref 6.5–8.1)

## 2019-07-24 LAB — CBC WITH DIFFERENTIAL/PLATELET
Abs Immature Granulocytes: 0.59 10*3/uL — ABNORMAL HIGH (ref 0.00–0.07)
Basophils Absolute: 0 10*3/uL (ref 0.0–0.1)
Basophils Relative: 0 %
Eosinophils Absolute: 0 10*3/uL (ref 0.0–0.5)
Eosinophils Relative: 0 %
HCT: 35.6 % — ABNORMAL LOW (ref 39.0–52.0)
Hemoglobin: 11.6 g/dL — ABNORMAL LOW (ref 13.0–17.0)
Immature Granulocytes: 5 %
Lymphocytes Relative: 2 %
Lymphs Abs: 0.3 10*3/uL — ABNORMAL LOW (ref 0.7–4.0)
MCH: 30.4 pg (ref 26.0–34.0)
MCHC: 32.6 g/dL (ref 30.0–36.0)
MCV: 93.4 fL (ref 80.0–100.0)
Monocytes Absolute: 0.3 10*3/uL (ref 0.1–1.0)
Monocytes Relative: 2 %
Neutro Abs: 10.4 10*3/uL — ABNORMAL HIGH (ref 1.7–7.7)
Neutrophils Relative %: 91 %
Platelets: 159 10*3/uL (ref 150–400)
RBC: 3.81 MIL/uL — ABNORMAL LOW (ref 4.22–5.81)
RDW: 14.5 % (ref 11.5–15.5)
WBC: 11.6 10*3/uL — ABNORMAL HIGH (ref 4.0–10.5)
nRBC: 0 % (ref 0.0–0.2)

## 2019-07-24 LAB — C-REACTIVE PROTEIN
CRP: 28.6 mg/dL — ABNORMAL HIGH (ref <1.0)
CRP: 35.1 mg/dL — ABNORMAL HIGH (ref <1.0)

## 2019-07-24 LAB — URINE CULTURE

## 2019-07-24 LAB — FIBRIN DERIVATIVES D-DIMER (ARMC ONLY): Fibrin derivatives D-dimer (ARMC): 1187.78 ng/mL (FEU) — ABNORMAL HIGH (ref 0.00–499.00)

## 2019-07-24 LAB — FERRITIN: Ferritin: 280 ng/mL (ref 24–336)

## 2019-07-24 MED ORDER — POTASSIUM CHLORIDE CRYS ER 20 MEQ PO TBCR
40.0000 meq | EXTENDED_RELEASE_TABLET | Freq: Once | ORAL | Status: AC
  Administered 2019-07-24: 40 meq via ORAL
  Filled 2019-07-24: qty 2

## 2019-07-24 MED ORDER — ENSURE MAX PROTEIN PO LIQD
11.0000 [oz_av] | Freq: Every day | ORAL | Status: DC
  Administered 2019-07-24: 11 [oz_av] via ORAL
  Filled 2019-07-24: qty 330

## 2019-07-24 MED FILL — NUBEQA 300 MG TABLET: 30 days supply | Qty: 120 | Fill #0 | Status: AC

## 2019-07-24 NOTE — ED Notes (Signed)
hospitalist at bedside

## 2019-07-24 NOTE — ED Notes (Signed)
Per pharmacy do not have pts med NUBEQA.  Called son morris and requested they bring from home.  He will bring it later today. Held AM dose.

## 2019-07-24 NOTE — Unmapped (Addendum)
07/24/19:  Patient admitted yesterday to [facility] Per CareEverywhere COVID (+), febrile, resp distress, POX 86% on RA.    Wife is known COVID (+) contact.  CE today reports 97%  sat on 4L 02.  Spoke with Mrs.Dority to let her know that we are aware of admission and will follow progress via CE.  Pt's son Adria Dill is primary contact for updates during the hospitalization.     Oncology Care team updated.    Lucia Gaskins RN MSN  Per diem NN

## 2019-07-24 NOTE — ED Notes (Addendum)
Pt assisted to hospital bed at this time by this RN and paige, rn. Pt had small bowel movement. Peri-care performed and new brief applied. Pt able to pull self over to hospital bed with no assistance. Pt maintaining 100% oxygen saturation while moving on room air.

## 2019-07-24 NOTE — ED Notes (Signed)
[place] and gave report to [employer]

## 2019-07-24 NOTE — ED Notes (Signed)
Pt given sandwich and ginger ale. Family brought meds but med needed not there. Called son and he reports that one is coming in mail tomorrow, will bring then.

## 2019-07-24 NOTE — ED Notes (Signed)
Pt only ate a few french fries from his dinner meal tray. Pt did drink all of his ensure.

## 2019-07-24 NOTE — ED Notes (Signed)
Pt accidentally took off oxygen at this time. Pt maintaining oxygen saturation at 99% on room air. This RN attempting to leave pt off oxygen to see if pt tolerates. MD aware.

## 2019-07-24 NOTE — ED Notes (Signed)
Pt cleaned from stool. New depends placed.  position changed. Pt drinking ginger ale. Minimal eating, would like ensure. Will message MD

## 2019-07-24 NOTE — ED Notes (Signed)
Pt denies any symptoms at this time other than being cold

## 2019-07-24 NOTE — ED Notes (Signed)
Spoke with son morris with pt verbal permission. Updated son on status and plan of care.

## 2019-07-24 NOTE — ED Notes (Signed)
Pt given ginger ale at this time.  

## 2019-07-24 NOTE — ED Notes (Signed)
Unlabored at this time. NAD. No needs.

## 2019-07-24 NOTE — Discharge Summary (Signed)
Physician Discharge Summary  Emir Strommer [id number] DOB: [birth year] DOA: 07/23/2019  PCP: Patient, No Pcp Per  Admit date: 07/23/2019 Discharge date: 07/24/2019  Admitted From: Home  Disposition:  [facility]   Recommendations for Outpatient Follow-up:  1. Follow up with PCP in 1-2 weeks 2. Please obtain BMP/CBC in one week 3. Please follow up on the following pending results:   Equipment/Devices: continue w/ supplemental oxygen   Discharge Condition: stable  CODE STATUS: Full  Diet recommendation: Regular w/ thin fluids   Brief/Interim Summary:  78 y/o M w/ PMH of prostate cancer, HTN, HLD who presented w/ generalized weakness and COVID19. Pt's respiratory status has continued to slowly decline and pt has required increasing oxygen and now currently at 6L [place]. Pt's temp, HR & RR have been consistently elevated. Pt never left ER and stayed in the ER waiting for a bed.  Pt status has worsen meeting criteria for transfer to [facility].   Discharge Diagnoses:  Principal Problem:   Pneumonia due to COVID-19 virus Active Problems:   COVID-19   HTN (hypertension)   HLD (hyperlipidemia)   Prostate cancer ([place])  COVID19 pneumonia: continue on airborne & contact precautions. Will continue steroids, abxs. Initial concern for transaminits but LFTs are WNL & Cr is WNL, so remdesivir can be started. Continue on supplemental oxygen and wean as tolerated  Acute hypoxic respiratory failure: secondary to COVID19 pneumonia. Continue w/ management as stated above  Hypokalemia: KCl repleated. Will continue to monitor  HTN: will hold home anti-HTN meds  HLD: will hold home statin and restart on d/c  Prostate cancer: will continue on home dose of darolutamide  Discharge Instructions:  Pt will transfer to [facility] for further care  Discharge Instructions    Diet - low sodium heart healthy   Complete by: As directed    Increase activity slowly    Complete by: As directed      Allergies as of 07/24/2019   No Known Allergies     Medication List    STOP taking these medications   amLODipine 5 MG tablet Commonly known as: NORVASC   atorvastatin 10 MG tablet Commonly known as: LIPITOR   dexamethasone 2 MG tablet Commonly known as: DECADRON   famotidine 20 MG tablet Commonly known as: PEPCID   hydrochlorothiazide 12.5 MG capsule Commonly known as: MICROZIDE   Potassium Chloride ER 20 MEQ Tbcr     TAKE these medications   allopurinol 100 MG tablet Commonly known as: ZYLOPRIM Take 100 mg by mouth daily.   darolutamide 300 MG tablet Commonly known as: NUBEQA Take 600 mg by mouth 2 (two) times daily.   oxyCODONE 5 MG immediate release tablet Commonly known as: Oxy IR/ROXICODONE Take 5 mg by mouth 2 (two) times daily as needed for pain.       No Known Allergies    Procedures/Studies: Dg Chest Port 1 View  Result Date: 07/23/2019 CLINICAL DATA:  Shortness of breath EXAM: PORTABLE CHEST 1 VIEW COMPARISON:  None. FINDINGS: There is patchy airspace opacity in the lung bases. Lungs elsewhere are clear. Heart size and pulmonary vascularity are normal. No adenopathy. There is postoperative change throughout much of the upper and midthoracic region. IMPRESSION: Patchy airspace opacity consistent with pneumonia in the lung bases. Lungs elsewhere clear. Cardiac silhouette normal. No adenopathy. Electronically Signed   By: Lowella Grip III M.D.   On: 07/23/2019 14:12       Subjective:   Discharge Exam: Vitals:  07/24/19 1200 07/24/19 1230  BP: 138/82 125/83  Pulse: 81 85  Resp: 13 18  Temp:    SpO2: 95% 95%   Vitals:   07/24/19 1000 07/24/19 1100 07/24/19 1200 07/24/19 1230  BP: 120/85 139/89 138/82 125/83  Pulse: 88 94 81 85  Resp: 19 20 13 18   Temp:      TempSrc:      SpO2: 97% 95% 95% 95%  Weight:      Height:        General: Pt is alert, awake, mild distress secondary to shortness of  breath  Cardiovascular: normal S1/S2 +, no rubs, no gallops Respiratory: decreased breath sounds b/l. No crackles Abdominal: Soft, NT, ND, bowel sounds + Extremities: no edema, no cyanosis    The results of significant diagnostics from this hospitalization (including imaging, microbiology, ancillary and laboratory) are listed below for reference.     Microbiology: Recent Results (from the past 240 hour(s))  Blood culture (routine x 2)     Status: None (Preliminary result)   Collection Time: 07/23/19  1:30 PM   Specimen: BLOOD  Result Value Ref Range Status   Specimen Description BLOOD RIGHT ANTECUBITAL  Final   Special Requests   Final    BOTTLES DRAWN AEROBIC AND ANAEROBIC Blood Culture results may not be optimal due to an excessive volume of blood received in culture bottles   Culture   Final    NO GROWTH < 24 HOURS Performed at [facility], [address] [place], [place] [address]    Report Status PENDING  Incomplete  Blood culture (routine x 2)     Status: None (Preliminary result)   Collection Time: 07/23/19  2:15 PM   Specimen: BLOOD  Result Value Ref Range Status   Specimen Description BLOOD BLOOD LEFT WRIST  Final   Special Requests   Final    BOTTLES DRAWN AEROBIC AND ANAEROBIC Blood Culture adequate volume   Culture   Final    NO GROWTH < 24 HOURS Performed at [facility], [address] [place], [place] [address]    Report Status PENDING  Incomplete  Respiratory Panel by PCR     Status: None   Collection Time: 07/23/19  8:30 PM   Specimen: Nasopharyngeal Swab; Respiratory  Result Value Ref Range Status   Adenovirus NOT DETECTED NOT DETECTED Final   Coronavirus 229E NOT DETECTED NOT DETECTED Final    Comment: (NOTE) The Coronavirus on the Respiratory Panel, DOES NOT test for the novel  Coronavirus (2019 nCoV)    Coronavirus HKU1 NOT DETECTED NOT DETECTED Final   Coronavirus NL63 NOT DETECTED NOT DETECTED Final   Coronavirus OC43 NOT  DETECTED NOT DETECTED Final   Metapneumovirus NOT DETECTED NOT DETECTED Final   Rhinovirus / Enterovirus NOT DETECTED NOT DETECTED Final   Influenza A NOT DETECTED NOT DETECTED Final   Influenza B NOT DETECTED NOT DETECTED Final   Parainfluenza Virus 1 NOT DETECTED NOT DETECTED Final   Parainfluenza Virus 2 NOT DETECTED NOT DETECTED Final   Parainfluenza Virus 3 NOT DETECTED NOT DETECTED Final   Parainfluenza Virus 4 NOT DETECTED NOT DETECTED Final   Respiratory Syncytial Virus NOT DETECTED NOT DETECTED Final   Bordetella pertussis NOT DETECTED NOT DETECTED Final   Chlamydophila pneumoniae NOT DETECTED NOT DETECTED Final   Mycoplasma pneumoniae NOT DETECTED NOT DETECTED Final    Comment: Performed at [facility], [place] [address], [place], [place] [address]     Labs: BNP (  last 3 results) No results for input(s): BNP in the last 8760 hours. Basic Metabolic Panel: Recent Labs  Lab 07/23/19 1320 07/23/19 2030 07/24/19 0528  NA 137  --  140  K 2.6*  --  3.2*  CL 96*  --  102  CO2 25  --  23  GLUCOSE 96  --  93  BUN 30*  --  24*  CREATININE 1.43* 1.24 0.93  CALCIUM 8.2*  --  8.0*   Liver Function Tests: Recent Labs  Lab 07/23/19 1324 07/24/19 0528  AST 38 38  ALT 20 19  ALKPHOS 60 49  BILITOT 1.0 0.7  PROT 7.3 6.7  ALBUMIN 3.2* 2.8*   Recent Labs  Lab 07/23/19 1324  LIPASE 53*   No results for input(s): AMMONIA in the last 168 hours. CBC: Recent Labs  Lab 07/23/19 1320 07/23/19 2030 07/24/19 0528  WBC 13.2* 13.2* 11.6*  NEUTROABS  --   --  10.4*  HGB 12.4* 11.1* 11.6*  HCT 36.0* 34.0* 35.6*  MCV 88.2 92.9 93.4  PLT 166 140* 159   Cardiac Enzymes: No results for input(s): CKTOTAL, CKMB, CKMBINDEX, TROPONINI in the last 168 hours. BNP: Invalid input(s): POCBNP CBG: No results for input(s): GLUCAP in the last 168 hours. D-Dimer No results for input(s): DDIMER in the last 72 hours. Hgb A1c No results for input(s): HGBA1C in the last 72  hours. Lipid Profile No results for input(s): CHOL, HDL, LDLCALC, TRIG, CHOLHDL, LDLDIRECT in the last 72 hours. Thyroid function studies No results for input(s): TSH, T4TOTAL, T3FREE, THYROIDAB in the last 72 hours.  Invalid input(s): FREET3 Anemia work up Recent Labs    07/23/19 2030 07/24/19 0528  FERRITIN 266 280   Urinalysis    Component Value Date/Time   COLORURINE YELLOW (A) 07/23/2019 2047   APPEARANCEUR HAZY (A) 07/23/2019 2047   APPEARANCEUR Cloudy 11/06/2013 1528   LABSPEC 1.014 07/23/2019 2047   LABSPEC 1.010 11/06/2013 1528   PHURINE 5.0 07/23/2019 2047   GLUCOSEU NEGATIVE 07/23/2019 2047   GLUCOSEU Negative 11/06/2013 1528   HGBUR MODERATE (A) 07/23/2019 2047   BILIRUBINUR NEGATIVE 07/23/2019 2047   BILIRUBINUR Negative 11/06/2013 [place] 07/23/2019 2047   PROTEINUR NEGATIVE 07/23/2019 2047   NITRITE NEGATIVE 07/23/2019 2047   LEUKOCYTESUR NEGATIVE 07/23/2019 2047   LEUKOCYTESUR Trace 11/06/2013 1528   Sepsis Labs Invalid input(s): PROCALCITONIN,  WBC,  LACTICIDVEN Microbiology Recent Results (from the past 240 hour(s))  Blood culture (routine x 2)     Status: None (Preliminary result)   Collection Time: 07/23/19  1:30 PM   Specimen: BLOOD  Result Value Ref Range Status   Specimen Description BLOOD RIGHT ANTECUBITAL  Final   Special Requests   Final    BOTTLES DRAWN AEROBIC AND ANAEROBIC Blood Culture results may not be optimal due to an excessive volume of blood received in culture bottles   Culture   Final    NO GROWTH < 24 HOURS Performed at [facility], [address] [place], [place] [address]    Report Status PENDING  Incomplete  Blood culture (routine x 2)     Status: None (Preliminary result)   Collection Time: 07/23/19  2:15 PM   Specimen: BLOOD  Result Value Ref Range Status   Specimen Description BLOOD BLOOD LEFT WRIST  Final   Special Requests   Final    BOTTLES DRAWN AEROBIC AND ANAEROBIC Blood Culture  adequate volume   Culture   Final  NO GROWTH < 24 HOURS Performed at [facility], [address] [place], [place] [address]    Report Status PENDING  Incomplete  Respiratory Panel by PCR     Status: None   Collection Time: 07/23/19  8:30 PM   Specimen: Nasopharyngeal Swab; Respiratory  Result Value Ref Range Status   Adenovirus NOT DETECTED NOT DETECTED Final   Coronavirus 229E NOT DETECTED NOT DETECTED Final    Comment: (NOTE) The Coronavirus on the Respiratory Panel, DOES NOT test for the novel  Coronavirus (2019 nCoV)    Coronavirus HKU1 NOT DETECTED NOT DETECTED Final   Coronavirus NL63 NOT DETECTED NOT DETECTED Final   Coronavirus OC43 NOT DETECTED NOT DETECTED Final   Metapneumovirus NOT DETECTED NOT DETECTED Final   Rhinovirus / Enterovirus NOT DETECTED NOT DETECTED Final   Influenza A NOT DETECTED NOT DETECTED Final   Influenza B NOT DETECTED NOT DETECTED Final   Parainfluenza Virus 1 NOT DETECTED NOT DETECTED Final   Parainfluenza Virus 2 NOT DETECTED NOT DETECTED Final   Parainfluenza Virus 3 NOT DETECTED NOT DETECTED Final   Parainfluenza Virus 4 NOT DETECTED NOT DETECTED Final   Respiratory Syncytial Virus NOT DETECTED NOT DETECTED Final   Bordetella pertussis NOT DETECTED NOT DETECTED Final   Chlamydophila pneumoniae NOT DETECTED NOT DETECTED Final   Mycoplasma pneumoniae NOT DETECTED NOT DETECTED Final    Comment: Performed at [facility], [place], [place], [place] [address]     Time coordinating discharge: Over 30 minutes  SIGNED:   Wyvonnia Dusky, MD  [facility] 07/24/2019, 12:39 PM Pager   If 7PM-7AM, please contact night-coverage [email or web address] Password [email or web address]

## 2019-07-24 NOTE — ED Notes (Signed)
Turned TV on for PT, pt comfortable at this time

## 2019-07-24 NOTE — ED Notes (Signed)
Changed pt's sheet and put on new brief. Assisted pt to use the urinal.

## 2019-07-25 ENCOUNTER — Encounter (HOSPITAL_COMMUNITY): Payer: Self-pay

## 2019-07-25 ENCOUNTER — Other Ambulatory Visit: Payer: Self-pay

## 2019-07-25 ENCOUNTER — Inpatient Hospital Stay (HOSPITAL_COMMUNITY)
Admission: AD | Admit: 2019-07-25 | Discharge: 2019-07-28 | DRG: 177 | Disposition: A | Discharge status: Home Health | Payer: Medicare HMO | Source: Other Acute Inpatient Hospital | Attending: Internal Medicine | Admitting: Internal Medicine

## 2019-07-25 DIAGNOSIS — Z8546 Personal history of malignant neoplasm of prostate: Secondary | ICD-10-CM | POA: Diagnosis not present

## 2019-07-25 DIAGNOSIS — U071 COVID-19: Secondary | ICD-10-CM | POA: Diagnosis present

## 2019-07-25 DIAGNOSIS — E785 Hyperlipidemia, unspecified: Secondary | ICD-10-CM | POA: Diagnosis present

## 2019-07-25 DIAGNOSIS — I10 Essential (primary) hypertension: Secondary | ICD-10-CM | POA: Diagnosis present

## 2019-07-25 DIAGNOSIS — Z79899 Other long term (current) drug therapy: Secondary | ICD-10-CM

## 2019-07-25 DIAGNOSIS — E876 Hypokalemia: Secondary | ICD-10-CM | POA: Diagnosis present

## 2019-07-25 DIAGNOSIS — K5289 Other specified noninfective gastroenteritis and colitis: Secondary | ICD-10-CM | POA: Diagnosis present

## 2019-07-25 DIAGNOSIS — J1289 Other viral pneumonia: Secondary | ICD-10-CM | POA: Diagnosis present

## 2019-07-25 DIAGNOSIS — J1282 Pneumonia due to coronavirus disease 2019: Secondary | ICD-10-CM

## 2019-07-25 DIAGNOSIS — J9601 Acute respiratory failure with hypoxia: Secondary | ICD-10-CM | POA: Diagnosis present

## 2019-07-25 LAB — CBC WITH DIFFERENTIAL/PLATELET
Abs Immature Granulocytes: 0.47 10*3/uL — ABNORMAL HIGH (ref 0.00–0.07)
Basophils Absolute: 0 10*3/uL (ref 0.0–0.1)
Basophils Relative: 0 %
Eosinophils Absolute: 0 10*3/uL (ref 0.0–0.5)
Eosinophils Relative: 0 %
HCT: 34.2 % — ABNORMAL LOW (ref 39.0–52.0)
Hemoglobin: 11.2 g/dL — ABNORMAL LOW (ref 13.0–17.0)
Immature Granulocytes: 6 %
Lymphocytes Relative: 3 %
Lymphs Abs: 0.3 10*3/uL — ABNORMAL LOW (ref 0.7–4.0)
MCH: 30.5 pg (ref 26.0–34.0)
MCHC: 32.7 g/dL (ref 30.0–36.0)
MCV: 93.2 fL (ref 80.0–100.0)
Monocytes Absolute: 0.4 10*3/uL (ref 0.1–1.0)
Monocytes Relative: 5 %
Neutro Abs: 7.2 10*3/uL (ref 1.7–7.7)
Neutrophils Relative %: 86 %
Platelets: 208 10*3/uL (ref 150–400)
RBC: 3.67 MIL/uL — ABNORMAL LOW (ref 4.22–5.81)
RDW: 14.4 % (ref 11.5–15.5)
WBC: 8.4 10*3/uL (ref 4.0–10.5)
nRBC: 0 % (ref 0.0–0.2)

## 2019-07-25 LAB — COMPREHENSIVE METABOLIC PANEL
ALT: 20 U/L (ref 0–44)
AST: 34 U/L (ref 15–41)
Albumin: 2.6 g/dL — ABNORMAL LOW (ref 3.5–5.0)
Alkaline Phosphatase: 55 U/L (ref 38–126)
Anion gap: 14 (ref 5–15)
BUN: 25 mg/dL — ABNORMAL HIGH (ref 8–23)
CO2: 28 mmol/L (ref 22–32)
Calcium: 8.2 mg/dL — ABNORMAL LOW (ref 8.9–10.3)
Chloride: 102 mmol/L (ref 98–111)
Creatinine, Ser: 0.88 mg/dL (ref 0.61–1.24)
GFR calc Af Amer: >60 mL/min (ref >60)
GFR calc non Af Amer: >60 mL/min (ref >60)
Glucose, Bld: 125 mg/dL — ABNORMAL HIGH (ref 70–99)
Potassium: 2.9 mmol/L — ABNORMAL LOW (ref 3.5–5.1)
Sodium: 144 mmol/L (ref 135–145)
Total Bilirubin: 0.5 mg/dL (ref 0.3–1.2)
Total Protein: 6.4 g/dL — ABNORMAL LOW (ref 6.5–8.1)

## 2019-07-25 LAB — PREPARE FRESH FROZEN PLASMA

## 2019-07-25 LAB — BPAM FFP
Blood Product Expiration Date: 202012020019
ISSUE DATE / TIME: 202012010111
Unit Type and Rh: 7300

## 2019-07-25 LAB — FERRITIN: Ferritin: 371 ng/mL — ABNORMAL HIGH (ref 24–336)

## 2019-07-25 LAB — C-REACTIVE PROTEIN: CRP: 23.5 mg/dL — ABNORMAL HIGH (ref <1.0)

## 2019-07-25 LAB — BRAIN NATRIURETIC PEPTIDE: B Natriuretic Peptide: 85.5 pg/mL (ref 0.0–100.0)

## 2019-07-25 LAB — PROCALCITONIN: Procalcitonin: 5.72 ng/mL

## 2019-07-25 LAB — D-DIMER, QUANTITATIVE: D-Dimer, Quant: 1.22 ug/mL-FEU — ABNORMAL HIGH (ref 0.00–0.50)

## 2019-07-25 MED ORDER — LOPERAMIDE HCL 2 MG PO CAPS
2.0000 mg | ORAL_CAPSULE | ORAL | Status: DC | PRN
  Administered 2019-07-25 – 2019-07-28 (×5): 2 mg via ORAL
  Filled 2019-07-25 (×6): qty 1

## 2019-07-25 MED ORDER — DAROLUTAMIDE 300 MG PO TABS
600.0000 mg | ORAL_TABLET | Freq: Two times a day (BID) | ORAL | Status: DC
  Filled 2019-07-25: qty 2

## 2019-07-25 MED ORDER — ENOXAPARIN SODIUM 40 MG/0.4ML ~~LOC~~ SOLN
40.0000 mg | SUBCUTANEOUS | Status: DC
  Administered 2019-07-25 – 2019-07-28 (×4): 40 mg via SUBCUTANEOUS
  Filled 2019-07-25 (×4): qty 0.4

## 2019-07-25 MED ORDER — INFLUENZA VAC A&B SA ADJ QUAD 0.5 ML IM PRSY: 0.5000 mL | PREFILLED_SYRINGE | INTRAMUSCULAR | Status: DC

## 2019-07-25 MED ORDER — OXYCODONE HCL 5 MG PO TABS: 5.0000 mg | ORAL_TABLET | Freq: Four times a day (QID) | ORAL | Status: DC | PRN

## 2019-07-25 MED ORDER — IPRATROPIUM-ALBUTEROL 20-100 MCG/ACT IN AERS
1.0000 | INHALATION_SPRAY | Freq: Four times a day (QID) | RESPIRATORY_TRACT | Status: DC
  Administered 2019-07-25 – 2019-07-28 (×14): 1 via RESPIRATORY_TRACT
  Filled 2019-07-25: qty 4

## 2019-07-25 MED ORDER — DOCUSATE SODIUM 100 MG PO CAPS
100.0000 mg | ORAL_CAPSULE | Freq: Every day | ORAL | Status: DC
  Administered 2019-07-25 – 2019-07-28 (×4): 100 mg via ORAL
  Filled 2019-07-25 (×4): qty 1

## 2019-07-25 MED ORDER — POTASSIUM CHLORIDE CRYS ER 20 MEQ PO TBCR
40.0000 meq | EXTENDED_RELEASE_TABLET | Freq: Three times a day (TID) | ORAL | Status: AC
  Administered 2019-07-25 (×3): 40 meq via ORAL
  Filled 2019-07-25 (×3): qty 2

## 2019-07-25 MED ORDER — INFLUENZA VAC A&B SA ADJ QUAD 0.5 ML IM PRSY
0.5000 mL | PREFILLED_SYRINGE | INTRAMUSCULAR | Status: DC | PRN
  Filled 2019-07-25: qty 0.5

## 2019-07-25 MED ORDER — ZINC SULFATE 220 (50 ZN) MG PO CAPS
220.0000 mg | ORAL_CAPSULE | Freq: Every day | ORAL | Status: DC
  Administered 2019-07-25 – 2019-07-28 (×4): 220 mg via ORAL
  Filled 2019-07-25 (×4): qty 1

## 2019-07-25 MED ORDER — ACETAMINOPHEN 325 MG PO TABS: 650.0000 mg | ORAL_TABLET | Freq: Four times a day (QID) | ORAL | Status: DC | PRN

## 2019-07-25 MED ORDER — DEXAMETHASONE 6 MG PO TABS
6.0000 mg | ORAL_TABLET | ORAL | Status: DC
  Administered 2019-07-25 – 2019-07-28 (×4): 6 mg via ORAL
  Filled 2019-07-25 (×4): qty 1

## 2019-07-25 MED ORDER — VITAMIN C 500 MG PO TABS
500.0000 mg | ORAL_TABLET | Freq: Every day | ORAL | Status: DC
  Administered 2019-07-25 – 2019-07-28 (×4): 500 mg via ORAL
  Filled 2019-07-25 (×4): qty 1

## 2019-07-25 MED ORDER — HYDROCOD POLST-CPM POLST ER 10-8 MG/5ML PO SUER: 5.0000 mL | Freq: Two times a day (BID) | ORAL | Status: DC | PRN

## 2019-07-25 MED ORDER — SODIUM CHLORIDE 0.9 % IV SOLN
100.0000 mg | Freq: Every day | INTRAVENOUS | Status: AC
  Administered 2019-07-25 – 2019-07-27 (×3): 100 mg via INTRAVENOUS
  Filled 2019-07-25 (×2): qty 100
  Filled 2019-07-25: qty 20

## 2019-07-25 MED ORDER — ALLOPURINOL 100 MG PO TABS
100.0000 mg | ORAL_TABLET | Freq: Every day | ORAL | Status: DC
  Administered 2019-07-25 – 2019-07-28 (×4): 100 mg via ORAL
  Filled 2019-07-25 (×4): qty 1

## 2019-07-25 MED ORDER — GUAIFENESIN-DM 100-10 MG/5ML PO SYRP
10.0000 mL | ORAL_SOLUTION | ORAL | Status: DC | PRN
  Administered 2019-07-27: 09:00:00 10 mL via ORAL
  Filled 2019-07-25: qty 10

## 2019-07-25 MED ORDER — SODIUM CHLORIDE 0.9 % IV SOLN
100.0000 mg | Freq: Every day | INTRAVENOUS | Status: DC
  Filled 2019-07-25: qty 20

## 2019-07-25 MED ORDER — POLYVINYL ALCOHOL 1.4 % OP SOLN
1.0000 [drp] | OPHTHALMIC | Status: DC | PRN
  Administered 2019-07-25: 18:00:00 1 [drp] via OPHTHALMIC
  Filled 2019-07-25: qty 15

## 2019-07-25 NOTE — ED Notes (Signed)
Pt oxygen saturation 88% on room air while pt sleeping. This RN increased oxygen to 2L with no improvement in oxygen saturation. This RN increased to 4L and oxygen saturation increased to 92%.

## 2019-07-25 NOTE — Progress Notes (Signed)
Spoke with the patient's son, [name] and gave him an update on the patient.  All questions answered at this time.

## 2019-07-25 NOTE — ED Notes (Signed)
Pt assisted with urinal before transportation. Mask placed on pt for transport. [facility] at bedside.

## 2019-07-25 NOTE — ED Notes (Signed)
Report called to [facility] 

## 2019-07-25 NOTE — H&P (Signed)
History and Physical    [name] [name] [id number] DOB: 02-Jul-1941 DOA: 07/25/2019  PCP: Patient, No Pcp Per  Patient coming from: home  Chief Complaint: dyspnea   HPI: 78 year old with a history of prostate cancer, HTN, and HLD who presented to the ER with shortness of breath and severe generalized weakness to the point that he was too weak to get out of his own car and actually slept in it.  On presentation to the ED he was found to be Covid positive with a fever of 101.6 and saturations of 86% on room air.  A CXR noted bilateral pulmonary infiltrates.  At the time of my visit he is resting comfortably in the bedside chair.  He denies chest pain.  He reports ongoing diarrhea.  He denies nausea or vomiting.  He reports poor appetite and feeling weak in general.  Review of Systems: As per HPI otherwise 10 point review of systems done notable for dyspnea, generalized weakness all other reviewed and are negative Past Medical History:  Diagnosis Date  . Hyperlipidemia   . Hypertension   . Prostatitis     Past Surgical History:  Procedure Laterality Date  . BACK SURGERY       reports that he has never smoked. He has never used smokeless tobacco. He reports current alcohol use. He reports previous drug use.  No Known Allergies  History reviewed. No pertinent family history.   Prior to Admission medications   Medication Sig Start Date End Date Taking? Authorizing Provider  allopurinol (ZYLOPRIM) 100 MG tablet Take 100 mg by mouth daily. 06/01/19  Yes [provider]  amLODipine (NORVASC) 5 MG tablet Take 5 mg by mouth daily. 05/03/19  Yes [provider]  atorvastatin (LIPITOR) 10 MG tablet Take 10 mg by mouth daily. 07/22/19  Yes [provider]  dexamethasone (DECADRON) 2 MG tablet Take 2 mg by mouth daily. 05/03/19  Yes [provider]  famotidine (PEPCID) 20 MG tablet Take 20 mg by mouth daily. 05/02/19  Yes [provider]   hydrochlorothiazide (MICROZIDE) 12.5 MG capsule Take 12.5 mg by mouth daily. 06/18/19  Yes [provider]  oxyCODONE (OXY IR/ROXICODONE) 5 MG immediate release tablet Take 5 mg by mouth 2 (two) times daily as needed for pain. 06/21/19  Yes [provider]  Potassium Chloride ER 20 MEQ TBCR Take 20 mEq by mouth daily. 07/22/19  Yes [provider]  darolutamide (NUBEQA) 300 MG tablet Take 600 mg by mouth 2 (two) times daily. 06/28/19   [provider]    Physical Exam: Vitals:   07/25/19 0345 07/25/19 0544 07/25/19 0726  BP:  130/85 140/80  Pulse: 81 81 86  Resp: 19 19 17   Temp: 98.9 F (37.2 C) 98.9 F (37.2 C) 99 F (37.2 C)  TempSrc: Oral  Oral  SpO2: 98%  93%  Weight:  77.1 kg   Height:  5\' 7"  (1.702 m)     Constitutional: NAD, calm, comfortable Vitals:   07/25/19 0345 07/25/19 0544 07/25/19 0726  BP:  130/85 140/80  Pulse: 81 81 86  Resp: 19 19 17   Temp: 98.9 F (37.2 C) 98.9 F (37.2 C) 99 F (37.2 C)  TempSrc: Oral  Oral  SpO2: 98%  93%  Weight:  77.1 kg   Height:  5\' 7"  (1.702 m)    General: No acute respiratory distress Lungs: Fine crackles bilateral bases with no wheezing Cardiovascular: Regular rate and rhythm without murmur gallop or rub normal S1  and S2 Abdomen: Nontender, nondistended, soft, bowel sounds positive, no rebound, no ascites, no appreciable mass Extremities: No significant cyanosis, clubbing, or edema bilateral lower extremities   Labs on Admission: I have personally reviewed following labs and imaging studies  CBC: Recent Labs  Lab 07/23/19 1320 07/23/19 2030 07/24/19 0528 07/25/19 0725  WBC 13.2* 13.2* 11.6* 8.4  NEUTROABS  --   --  10.4* 7.2  HGB 12.4* 11.1* 11.6* 11.2*  HCT 36.0* 34.0* 35.6* 34.2*  MCV 88.2 92.9 93.4 93.2  PLT 166 140* 159 [id number]   Basic Metabolic Panel: Recent Labs  Lab 07/23/19 1320 07/23/19 2030 07/24/19 0528 07/25/19 0725  NA 137  --  140 144  K 2.6*  --  3.2* 2.9*   CL 96*  --  102 102  CO2 25  --  23 28  GLUCOSE 96  --  93 125*  BUN 30*  --  24* 25*  CREATININE 1.43* 1.24 0.93 0.88  CALCIUM 8.2*  --  8.0* 8.2*   GFR: Estimated Creatinine Clearance: 65.7 mL/min (by C-G formula based on SCr of 0.88 mg/dL). Liver Function Tests: Recent Labs  Lab 07/23/19 1324 07/24/19 0528 07/25/19 0725  AST 38 38 34  ALT 20 19 20   ALKPHOS 60 49 55  BILITOT 1.0 0.7 0.5  PROT 7.3 6.7 6.4*  ALBUMIN 3.2* 2.8* 2.6*   Recent Labs  Lab 07/23/19 1324  LIPASE 53*   Anemia Panel: Recent Labs    07/23/19 2030 07/24/19 0528  FERRITIN 266 280   Urine analysis:    Component Value Date/Time   COLORURINE YELLOW (A) 07/23/2019 2047   APPEARANCEUR HAZY (A) 07/23/2019 2047   APPEARANCEUR Cloudy 11/06/2013 1528   LABSPEC 1.014 07/23/2019 2047   LABSPEC 1.010 11/06/2013 1528   PHURINE 5.0 07/23/2019 2047   GLUCOSEU NEGATIVE 07/23/2019 2047   GLUCOSEU Negative 11/06/2013 1528   HGBUR MODERATE (A) 07/23/2019 2047   BILIRUBINUR NEGATIVE 07/23/2019 2047   BILIRUBINUR Negative 11/06/2013 [place] 07/23/2019 2047   PROTEINUR NEGATIVE 07/23/2019 2047   NITRITE NEGATIVE 07/23/2019 2047   LEUKOCYTESUR NEGATIVE 07/23/2019 2047   LEUKOCYTESUR Trace 11/06/2013 1528    Radiological Exams on Admission: Dg Chest Port 1 View  Result Date: 07/23/2019 CLINICAL DATA:  Shortness of breath EXAM: PORTABLE CHEST 1 VIEW COMPARISON:  None. FINDINGS: There is patchy airspace opacity in the lung bases. Lungs elsewhere are clear. Heart size and pulmonary vascularity are normal. No adenopathy. There is postoperative change throughout much of the upper and midthoracic region. IMPRESSION: Patchy airspace opacity consistent with pneumonia in the lung bases. Lungs elsewhere clear. Cardiac silhouette normal. No adenopathy. Electronically Signed   By: Lowella Grip III M.D.   On: 07/23/2019 14:12    EKG: Independently reviewed.  Sinus tachycardia no acute ST  changes.  Assessment/Plan  Covid pneumonia - acute hypoxic respiratory failure Continue oxygen support -continue Decadron and remdesivir -wean oxygen as able -no Actemra given concern for bacterial pneumonia  Recent Labs  Lab 07/23/19 1324 07/23/19 2030 07/24/19 0528 07/25/19 0725  DDIMER  --   --   --  1.22*  FERRITIN  --  266 280 371*  CRP  --  28.6* 35.1* 23.5*  ALT 20  --  19 20  PROCALCITON 5.36 7.22  --  5.72    Suspected bacterial PNA Continue empiric abx coverage given elevated procalcitonin    Hypokalemia Supplement and follow -likely due to poor oral intake and GI losses -check magnesium  Diarrhea Likely Covid gastroenteritis -Imodium as needed -follow  Hypertension Blood pressure reasonably controlled at this time -follow without change today  Hyperlipidemia Hold medical therapy until oral intake improved overall  Prostate cancer Continue usual home medical therapy  DVT prophylaxis: Heparin SQ  Code Status: Full code Family Communication: None at bedside Disposition Plan:   Completed inpatient requiring IV steroids, IV antibiotics, aggressive care for Covid infection. Consults called: None Admission status: Inpatient   Cherene Altes MD [facility]   If 7PM-7AM, please contact night-coverage   07/25/2019, 10:02 AM

## 2019-07-26 ENCOUNTER — Inpatient Hospital Stay (HOSPITAL_COMMUNITY): Payer: Medicare HMO

## 2019-07-26 LAB — COMPREHENSIVE METABOLIC PANEL
ALT: 19 U/L (ref 0–44)
AST: 27 U/L (ref 15–41)
Albumin: 2.5 g/dL — ABNORMAL LOW (ref 3.5–5.0)
Alkaline Phosphatase: 47 U/L (ref 38–126)
Anion gap: 10 (ref 5–15)
BUN: 23 mg/dL (ref 8–23)
CO2: 27 mmol/L (ref 22–32)
Calcium: 7.9 mg/dL — ABNORMAL LOW (ref 8.9–10.3)
Chloride: 106 mmol/L (ref 98–111)
Creatinine, Ser: 0.86 mg/dL (ref 0.61–1.24)
GFR calc Af Amer: >60 mL/min (ref >60)
GFR calc non Af Amer: >60 mL/min (ref >60)
Glucose, Bld: 107 mg/dL — ABNORMAL HIGH (ref 70–99)
Potassium: 3 mmol/L — ABNORMAL LOW (ref 3.5–5.1)
Sodium: 143 mmol/L (ref 135–145)
Total Bilirubin: 0.9 mg/dL (ref 0.3–1.2)
Total Protein: 5.8 g/dL — ABNORMAL LOW (ref 6.5–8.1)

## 2019-07-26 LAB — CBC WITH DIFFERENTIAL/PLATELET
Abs Immature Granulocytes: 0.11 10*3/uL — ABNORMAL HIGH (ref 0.00–0.07)
Basophils Absolute: 0 10*3/uL (ref 0.0–0.1)
Basophils Relative: 0 %
Eosinophils Absolute: 0 10*3/uL (ref 0.0–0.5)
Eosinophils Relative: 0 %
HCT: 32.5 % — ABNORMAL LOW (ref 39.0–52.0)
Hemoglobin: 10.4 g/dL — ABNORMAL LOW (ref 13.0–17.0)
Immature Granulocytes: 2 %
Lymphocytes Relative: 5 %
Lymphs Abs: 0.3 10*3/uL — ABNORMAL LOW (ref 0.7–4.0)
MCH: 30.1 pg (ref 26.0–34.0)
MCHC: 32 g/dL (ref 30.0–36.0)
MCV: 93.9 fL (ref 80.0–100.0)
Monocytes Absolute: 0.5 10*3/uL (ref 0.1–1.0)
Monocytes Relative: 9 %
Neutro Abs: 5.2 10*3/uL (ref 1.7–7.7)
Neutrophils Relative %: 84 %
Platelets: 210 10*3/uL (ref 150–400)
RBC: 3.46 MIL/uL — ABNORMAL LOW (ref 4.22–5.81)
RDW: 14.6 % (ref 11.5–15.5)
WBC: 6.1 10*3/uL (ref 4.0–10.5)
nRBC: 0 % (ref 0.0–0.2)

## 2019-07-26 LAB — MAGNESIUM: Magnesium: 2 mg/dL (ref 1.7–2.4)

## 2019-07-26 LAB — FERRITIN: Ferritin: 267 ng/mL (ref 24–336)

## 2019-07-26 LAB — D-DIMER, QUANTITATIVE: D-Dimer, Quant: 1.01 ug/mL-FEU — ABNORMAL HIGH (ref 0.00–0.50)

## 2019-07-26 LAB — GLUCOSE, CAPILLARY: Glucose-Capillary: 102 mg/dL — ABNORMAL HIGH (ref 70–99)

## 2019-07-26 LAB — C-REACTIVE PROTEIN: CRP: 11.8 mg/dL — ABNORMAL HIGH (ref <1.0)

## 2019-07-26 MED ORDER — DAROLUTAMIDE 300 MG PO TABS
600.0000 mg | ORAL_TABLET | Freq: Two times a day (BID) | ORAL | Status: DC
  Administered 2019-07-26 – 2019-07-28 (×4): 600 mg via ORAL
  Filled 2019-07-26 (×2): qty 2

## 2019-07-26 MED ORDER — POTASSIUM CHLORIDE CRYS ER 20 MEQ PO TBCR
40.0000 meq | EXTENDED_RELEASE_TABLET | Freq: Three times a day (TID) | ORAL | Status: AC
  Administered 2019-07-26 (×3): 40 meq via ORAL
  Filled 2019-07-26 (×3): qty 2

## 2019-07-26 NOTE — Progress Notes (Signed)
Deloris Poarch  [id number] DOB: 1941/08/06 DOA: 07/25/2019 PCP: Patient, No Pcp Per    Brief Narrative:  78 year old with a history of prostate cancer, HTN, and HLD who presented to the ER with shortness of breath and severe generalized weakness to the point that he was too weak to get out of his own car and actually slept in it.  On presentation to the ED he was found to be Covid positive with a fever of 101.6 and saturations of 86% on room air.  A CXR noted bilateral pulmonary infiltrates.  Significant Events: 11/30 admit to [facility] via [facility] ED 12/2 transfer to [facility]  COVID-19 specific Treatment: Decadron 11/30 > Remdesivir 11/30 > 12/4  Subjective: States that he is feeling better overall.  Denies significant shortness of breath at rest.  Reports that his diarrhea has nearly resolved.  Admits to poor appetite and ongoing generalized weakness.  Assessment & Plan:  Covid pneumonia - acute hypoxic respiratory failure Continue oxygen support with weaning as possible -continue Decadron and remdesivir - no Actemra given concern for bacterial pneumonia  Recent Labs  Lab 07/23/19 1324 07/23/19 2030 07/24/19 0528 07/25/19 0725 07/26/19 0440  DDIMER  --   --   --  1.22* 1.01*  FERRITIN  --  266 280 371* 267  CRP  --  28.6* 35.1* 23.5* 11.8*  ALT 20  --  19 20 19   PROCALCITON 5.36 7.22  --  5.72  --      Suspected bacterial PNA Received 2 days of empiric antibiotic due to elevated procalcitonin -clinically appears stable at this time without antibiotic dosing -recheck procalcitonin in a.m.   Persistent hypokalemia Supplement further and follow -likely due to poor oral intake and GI losses -magnesium is normal  Diarrhea Likely Covid gastroenteritis -Imodium as needed -appears to be improving  Hypertension No change in treatment regimen today  Hyperlipidemia Hold medical therapy until oral intake improved overall  Prostate cancer Continue usual home medical  therapy  DVT prophylaxis: Lovenox Code Status: FULL CODE Family Communication:  Disposition Plan: Telemetry bed  Consultants:  none  Antimicrobials:  Azithromycin 11/30 > 12/1 Rocephin 11/30 > 12/1  Objective: Blood pressure 119/81, pulse 86, temperature 98.7 F (37.1 C), temperature source Oral, resp. rate (!) 21, height 5\' 7"  (1.702 m), weight 77.1 kg, SpO2 91 %.  Intake/Output Summary (Last 24 hours) at 07/26/2019 1000 Last data filed at 07/26/2019 [id number] Gross per 24 hour  Intake 1690 ml  Output 1400 ml  Net 290 ml   Filed Weights   07/25/19 0544  Weight: 77.1 kg    Examination: General: No acute respiratory distress Lungs: Fine bibasilar crackles with good air movement throughout other fields without wheezing Cardiovascular: Regular rate and rhythm without murmur gallop or rub normal S1 and S2 Abdomen: Nontender, nondistended, soft, bowel sounds positive, no rebound, no ascites, no appreciable mass Extremities: No significant cyanosis, clubbing, or edema bilateral lower extremities  CBC: Recent Labs  Lab 07/24/19 0528 07/25/19 0725 07/26/19 0440  WBC 11.6* 8.4 6.1  NEUTROABS 10.4* 7.2 5.2  HGB 11.6* 11.2* 10.4*  HCT 35.6* 34.2* 32.5*  MCV 93.4 93.2 93.9  PLT 159 208 [id number]   Basic Metabolic Panel: Recent Labs  Lab 07/24/19 0528 07/25/19 0725 07/26/19 0440  NA 140 144 143  K 3.2* 2.9* 3.0*  CL 102 102 106  CO2 23 28 27   GLUCOSE 93 125* 107*  BUN 24* 25* 23  CREATININE 0.93 0.88 0.86  CALCIUM 8.0* 8.2*  7.9*  MG  --   --  2.0   GFR: Estimated Creatinine Clearance: 67.3 mL/min (by C-G formula based on SCr of 0.86 mg/dL).  Liver Function Tests: Recent Labs  Lab 07/23/19 1324 07/24/19 0528 07/25/19 0725 07/26/19 0440  AST 38 38 34 27  ALT 20 19 20 19   ALKPHOS 60 49 55 47  BILITOT 1.0 0.7 0.5 0.9  PROT 7.3 6.7 6.4* 5.8*  ALBUMIN 3.2* 2.8* 2.6* 2.5*   Recent Labs  Lab 07/23/19 1324  LIPASE 53*    CBG: Recent Labs  Lab 07/26/19 0745   GLUCAP 102*    Recent Results (from the past 240 hour(s))  Blood culture (routine x 2)     Status: None (Preliminary result)   Collection Time: 07/23/19  1:30 PM   Specimen: BLOOD  Result Value Ref Range Status   Specimen Description BLOOD RIGHT ANTECUBITAL  Final   Special Requests   Final    BOTTLES DRAWN AEROBIC AND ANAEROBIC Blood Culture results may not be optimal due to an excessive volume of blood received in culture bottles   Culture   Final    NO GROWTH 3 DAYS Performed at [facility], [address] [place], [place] [address]    Report Status PENDING  Incomplete  Blood culture (routine x 2)     Status: None (Preliminary result)   Collection Time: 07/23/19  2:15 PM   Specimen: BLOOD  Result Value Ref Range Status   Specimen Description BLOOD BLOOD LEFT WRIST  Final   Special Requests   Final    BOTTLES DRAWN AEROBIC AND ANAEROBIC Blood Culture adequate volume   Culture   Final    NO GROWTH 3 DAYS Performed at [facility], [address] [place], [place] [address]    Report Status PENDING  Incomplete  Respiratory Panel by PCR     Status: None   Collection Time: 07/23/19  8:30 PM   Specimen: Nasopharyngeal Swab; Respiratory  Result Value Ref Range Status   Adenovirus NOT DETECTED NOT DETECTED Final   Coronavirus 229E NOT DETECTED NOT DETECTED Final    Comment: (NOTE) The Coronavirus on the Respiratory Panel, DOES NOT test for the novel  Coronavirus (2019 nCoV)    Coronavirus HKU1 NOT DETECTED NOT DETECTED Final   Coronavirus NL63 NOT DETECTED NOT DETECTED Final   Coronavirus OC43 NOT DETECTED NOT DETECTED Final   Metapneumovirus NOT DETECTED NOT DETECTED Final   Rhinovirus / Enterovirus NOT DETECTED NOT DETECTED Final   Influenza A NOT DETECTED NOT DETECTED Final   Influenza B NOT DETECTED NOT DETECTED Final   Parainfluenza Virus 1 NOT DETECTED NOT DETECTED Final   Parainfluenza Virus 2 NOT DETECTED NOT DETECTED Final   Parainfluenza  Virus 3 NOT DETECTED NOT DETECTED Final   Parainfluenza Virus 4 NOT DETECTED NOT DETECTED Final   Respiratory Syncytial Virus NOT DETECTED NOT DETECTED Final   Bordetella pertussis NOT DETECTED NOT DETECTED Final   Chlamydophila pneumoniae NOT DETECTED NOT DETECTED Final   Mycoplasma pneumoniae NOT DETECTED NOT DETECTED Final    Comment: Performed at [facility], [address] [place], [place] [address]  Urine culture     Status: Abnormal   Collection Time: 07/23/19  8:47 PM   Specimen: In/Out Cath Urine  Result Value Ref Range Status   Specimen Description   Final    IN/OUT CATH URINE Performed at [facility], [address], [place], [place] [address]  Special Requests   Final    NONE Performed at [facility], [address] [place],  [address]    Culture MULTIPLE SPECIES PRESENT, SUGGEST RECOLLECTION (A)  Final   Report Status 07/24/2019 FINAL  Final     Scheduled Meds: . allopurinol  100 mg Oral Daily  . darolutamide  600 mg Oral BID  . dexamethasone  6 mg Oral Q24H  . docusate sodium  100 mg Oral Daily  . enoxaparin (LOVENOX) injection  40 mg Subcutaneous Q24H  . Ipratropium-Albuterol  1 puff Inhalation Q6H  . vitamin C  500 mg Oral Daily  . zinc sulfate  220 mg Oral Daily   Continuous Infusions: . remdesivir 100 mg in NS 250 mL 100 mg (07/25/19 1629)     LOS: 1 day   Cherene Altes, MD [facility] Office  [phone] Pager - Text Page per Amion  If 7PM-7AM, please contact night-coverage per Amion 07/26/2019, 10:00 AM

## 2019-07-26 NOTE — Progress Notes (Signed)
SATURATION QUALIFICATIONS: (This note is used to comply with regulatory documentation for home oxygen)  Patient Saturations on Room Air at Rest = 93%  Patient Saturations on Room Air while Ambulating = 85%  Patient Saturations on 2 Liters of oxygen while Ambulating = 90%  Please briefly explain why patient needs home oxygen: Pt desaturates with mobility on RA.  Verdene Lennert, PT, DPT  Acute Rehabilitation [phone] pager [phone] office  @ [place] [facility]: [phone]

## 2019-07-26 NOTE — Progress Notes (Signed)
Spoke with son [name], and gave him an update on his father's condition.

## 2019-07-26 NOTE — Progress Notes (Signed)
Home medications taken to Pharmacy to be dispensed.

## 2019-07-26 NOTE — TOC Initial Note (Signed)
Transition of Care [facility]) - Initial/Assessment Note    Patient Details  Name: [name] [name] MRN: [id number] Date of Birth: [birth year]  Transition of Care [facility]) CM/SW Contact:    Carles Collet, RN Phone Number: 07/26/2019, 2:09 PM  Clinical Narrative:              Spoke tp patient on the phone to discuss DC plan. He would like to use [facility] for [facility] services, reviewed Medicare ratings. He states he has a RW, but would like a 3/1 to go home with. He will also need home oxygen set up.  Referral accepted by [facility] for [facility] services. Referral sent to [facility] for 3/1 and home O2.  Expected DC Friday. Could not reach wife to verify plans with her.    NEEDS- O2 and 3/1 taken to room Huey Romans closet empty today) and home delivery of O2 verified.       Expected Discharge Plan: [place] Barriers to Discharge: Continued Medical Work up   Patient Goals and CMS Choice Patient states their goals for this hospitalization and ongoing recovery are:: to go home CMS Medicare.gov Compare Post Acute Care list provided to:: Patient Choice offered to / list presented to : Patient  Expected Discharge Plan and Services Expected Discharge Plan: [place]   Discharge Planning Services: CM Consult Post Acute Care Choice: Home Health                   DME Arranged: 3-N-1, Oxygen DME Agency: [place] Date DME Agency Contacted: 07/26/19 Time DME Agency Contacted: [id number] Representative spoke with at DME Agency: Magda Paganini HH Arranged: PT, OT [place] Agency: [place] Date [place]: 07/26/19 Time [place]: 73 Representative spoke with at [place]: Tommi Rumps  Prior Living Arrangements/Services   Lives with:: Spouse                   Activities of Daily Living Home Assistive Devices/Equipment: None ADL Screening (condition at time of admission) Patient's cognitive ability adequate to safely complete daily activities?: Yes Is  the patient deaf or have difficulty hearing?: No Does the patient have difficulty seeing, even when wearing glasses/contacts?: No Does the patient have difficulty concentrating, remembering, or making decisions?: No Patient able to express need for assistance with ADLs?: Yes Does the patient have difficulty dressing or bathing?: No Independently performs ADLs?: Yes (appropriate for developmental age) Does the patient have difficulty walking or climbing stairs?: No Weakness of Legs: Both Weakness of Arms/Hands: None  Permission Sought/Granted                  Emotional Assessment              Admission diagnosis:  covid 19 Patient Active Problem List   Diagnosis Date Noted  . Acute hypoxemic respiratory failure due to severe acute respiratory syndrome coronavirus 2 (SARS-CoV-2) disease ([employer]) 07/25/2019  . COVID-19 07/23/2019  . HTN (hypertension) 07/23/2019  . Pneumonia due to COVID-19 virus 07/23/2019  . HLD (hyperlipidemia) 07/23/2019  . Prostate cancer ([place]) 07/23/2019   PCP:  Patient, No Pcp Per Pharmacy:   [id number] - [name], [address] [place] [address] Phone: [phone] Fax: [phone]     Social Determinants of Health (SDOH) Interventions    Readmission Risk Interventions No flowsheet data found.

## 2019-07-26 NOTE — Evaluation (Signed)
Physical Therapy Evaluation Patient Details Name: [name] [name] MRN: [id number] DOB: 10/01/1940 Today's Date: 07/26/2019   History of Present Illness  78 y.o. male admitted on 07/25/19 for SOB and weakness.  Pt dx with COVID 19 PNA with acute hypoxic respiratory failure, suspected bacterial PNA as well, diarrhea, hypokalemia.  Pt with significant PMH of HTN, back surgery, prostate CA.    Clinical Impression  Pt has decreased O2 with gait requiring 2 L O2 [place] to keep him above 88% with mobility.  He has DOE 2/4 and O2 was measured with pedi finger probe.  Pt is weaker and more off balance than his usual independence (no AD at baseline) requiring a RW for safe gait around the room.  Slow, cautious speed due to DOE.   PT to follow acutely for deficits listed below.    Follow Up Recommendations Home health PT;Supervision for mobility/OOB    Equipment Recommendations  Rolling walker with 5" wheels;Other (comment)(possible home O2)    Recommendations for Other Services   NA    Precautions / Restrictions Precautions Precautions: Other (comment) Precaution Comments: monitor O2 sats      Mobility  Bed Mobility               General bed mobility comments: Pt OOB in the recliner chair  Transfers Overall transfer level: Needs assistance Equipment used: None Transfers: Sit to/from Stand Sit to Stand: Supervision         General transfer comment: close supervision to slowly get to standing with heavy reliance on UEs for transitions, switched to RW once standing.   Ambulation/Gait Ambulation/Gait assistance: Supervision Gait Distance (Feet): 20 Feet Assistive device: Rolling walker (2 wheeled) Gait Pattern/deviations: Step-through pattern;Shuffle     General Gait Details: Very slow, cautious gait pattern, DOE 2/4, desaturated on RA during gait and had to have 2 L O2  to maintain sats >88%.  Pt reported he needed "something to hang onto" so RW used for gait. He thinks he has one  at home somewhere.          Balance Overall balance assessment: Needs assistance Sitting-balance support: Feet supported;No upper extremity supported Sitting balance-Leahy Scale: Good     Standing balance support: Bilateral upper extremity supported Standing balance-Leahy Scale: Fair                               Pertinent Vitals/Pain Pain Assessment: No/denies pain    Home Living Family/patient expects to be discharged to:: Private residence Living Arrangements: Spouse/significant other Available Help at Discharge: Family;Available PRN/intermittently(spouse works) Type of Home: House Home Access: Stairs to enter   Technical brewer of Steps: 2 Home Layout: One level Home Equipment: Grab bars - tub/shower;Walker - 2 wheels(walking stick )      Prior Function Level of Independence: Independent         Comments: driving        Extremity/Trunk Assessment   Upper Extremity Assessment Upper Extremity Assessment: Defer to OT evaluation    Lower Extremity Assessment Lower Extremity Assessment: Generalized weakness    Cervical / Trunk Assessment Cervical / Trunk Assessment: Normal  Communication   Communication: No difficulties  Cognition Arousal/Alertness: Awake/alert Behavior During Therapy: WFL for tasks assessed/performed Overall Cognitive Status: Within Functional Limits for tasks assessed  General Comments General comments (skin integrity, edema, etc.): Reported he has already done IS and FV this hour and did not want to do them again with me.         Assessment/Plan    PT Assessment Patient needs continued PT services  PT Problem List Decreased strength;Decreased activity tolerance;Decreased balance;Decreased mobility;Decreased knowledge of use of DME;Decreased knowledge of precautions;Cardiopulmonary status limiting activity       PT Treatment Interventions DME  instruction;Gait training;Stair training;Functional mobility training;Therapeutic activities;Therapeutic exercise;Balance training;Patient/family education    PT Goals (Current goals can be found in the Care Plan section)  Acute Rehab PT Goals Patient Stated Goal: to go home PT Goal Formulation: With patient Time For Goal Achievement: 08/09/19 Potential to Achieve Goals: Good    Frequency Min 3X/week           AM-PAC PT "6 Clicks" Mobility  Outcome Measure Help needed turning from your back to your side while in a flat bed without using bedrails?: A Little Help needed moving from lying on your back to sitting on the side of a flat bed without using bedrails?: A Little Help needed moving to and from a bed to a chair (including a wheelchair)?: None Help needed standing up from a chair using your arms (e.g., wheelchair or bedside chair)?: None Help needed to walk in hospital room?: None Help needed climbing 3-5 steps with a railing? : A Little 6 Click Score: 21    End of Session Equipment Utilized During Treatment: Oxygen(2 L O2 [place]) Activity Tolerance: Patient limited by fatigue Patient left: in chair;with call bell/phone within reach Nurse Communication: Mobility status PT Visit Diagnosis: Muscle weakness (generalized) (M62.81);Difficulty in walking, not elsewhere classified (R26.2)    Time: [id number] PT Time Calculation (min) (ACUTE ONLY): 26 min   Charges:       Verdene Lennert, PT, DPT  Acute Rehabilitation [phone] pager #[phone] office  @ [place]: [phone]   PT Evaluation $PT Eval Moderate Complexity: 1 Mod PT Treatments $Gait Training: 8-22 mins      07/26/2019, 12:52 PM

## 2019-07-26 NOTE — Progress Notes (Signed)
OT Evaluation  This 78 y/o male presents with the above. PTA pt reports independence with ADL, iADL and functional mobility. Pt performing functional transfers with minA this session. He currently requires min-modA for toileting and LB ADL, setup/minguard assist for seated UB ADL. Pt with nasal cannula off upon arrival to room with SpO2 88-91% while seated on BSC, post toileting ADL and transfer back to recliner O2 sats decreasing/sustaining around 83% - reapplied O2 at 3.5L and sats returning to >90%. Pt will benefit from continued acute OT services and recommend follow up [facility] services after discharge to maximize his safety and independence with ADL/mobility. Will follow.     07/26/19 1107  OT Visit Information  Last OT Received On 07/26/19  Assistance Needed +1  History of Present Illness 78 y.o. male admitted on 07/25/19 for SOB and weakness.  Pt dx with COVID 19 PNA with acute hypoxic respiratory failure, suspected bacterial PNA as well, diarrhea, hypokalemia.  Pt with significant PMH of HTN, back surgery, prostate CA.    Precautions  Precautions Other (comment)  Precaution Comments monitor O2 sats  Restrictions  Weight Bearing Restrictions No  Home Living  Family/patient expects to be discharged to: Private residence  Living Arrangements Spouse/significant other  Available Help at Discharge Family;Available PRN/intermittently (spouse works)  Type of Barrister's clerk to enter  [employer] of Steps 2  [place] One level  Bathroom Shower/Tub Tub/shower unit  [employer]  (comfort height)  Home Equipment Grab bars - tub/shower;Walker - 2 wheels (walking stick )  Prior Function  Level of Independence Independent  Comments driving  Communication  Communication No difficulties  Pain Assessment  Pain Assessment No/denies pain  Cognition  Arousal/Alertness Awake/alert  Behavior During Therapy WFL for tasks assessed/performed  Overall Cognitive Status  Within Functional Limits for tasks assessed  Upper Extremity Assessment  Upper Extremity Assessment Generalized weakness  Lower Extremity Assessment  Lower Extremity Assessment Defer to PT evaluation  Cervical / Trunk Assessment  Cervical / Trunk Assessment Normal  ADL  Overall ADL's  Needs assistance/impaired  Eating/Feeding Modified independent;Sitting  Grooming Set up;Wash/dry face;Wash/dry hands;Sitting  Grooming Details (indicate cue type and reason) seated in recliner  Upper Body Bathing Min guard;Sitting  Lower Body Bathing Minimal assistance;Sit to/from stand  Upper Body Dressing  Set up;Min guard;Sitting  Lower Body Dressing Moderate assistance;Sit to/from stand  Toilet Transfer Minimal assistance;Stand-pivot;BSC  Toileting- Clothing Manipulation and Hygiene Minimal assistance;Moderate assistance;Sit to/from stand  Toileting - Clothing Manipulation Details (indicate cue type and reason) pt able to perform pericare after BM while standing, assist for balance in standing and gown management  General ADL Comments pt seated on BSC upon arrival, assisted with completion of toileting needs and transfer back to recliner during session  Bed Mobility  General bed mobility comments Pt OOB in the recliner chair  Transfers  Overall transfer level Needs assistance  Equipment used None  Transfers Sit to/from Stand;Stand Pivot Transfers  Sit to Stand Supervision  Stand pivot transfers Min assist  General transfer comment close supervision to rise from [facility], steadying assist and pt utilizing HHA/armrest to take pivotal steps to recliner   Balance  Overall balance assessment Needs assistance  Sitting-balance support Feet supported  Sitting balance-Leahy Scale Good  Standing balance support Bilateral upper extremity supported  Standing balance-Leahy Scale Fair  Exercises  Exercises Other exercises  Other Exercises  Other Exercises use of flutter valve and IS x3-5 each  OT - End of  Session  Equipment Utilized During Treatment Oxygen  Activity Tolerance Patient tolerated treatment well  Patient left in chair;with call bell/phone within reach;with chair alarm set  Nurse Communication Mobility status  OT Assessment  OT Recommendation/Assessment Patient needs continued OT Services  OT Visit Diagnosis Unsteadiness on feet (R26.81);Muscle weakness (generalized) (M62.81)  OT Problem List Decreased strength;Decreased range of motion;Decreased activity tolerance;Impaired balance (sitting and/or standing);Decreased knowledge of use of DME or AE;Cardiopulmonary status limiting activity  OT Plan  OT Frequency (ACUTE ONLY) Min 3X/week  OT Treatment/Interventions (ACUTE ONLY) Self-care/ADL training;Therapeutic exercise;Neuromuscular education;Energy conservation;DME and/or AE instruction;Therapeutic activities;Patient/family education;Balance training  AM-PAC OT "6 Clicks" Daily Activity Outcome Measure (Version 2)  Help from another person eating meals? 4  Help from another person taking care of personal grooming? 3  Help from another person toileting, which includes using toliet, bedpan, or urinal? 3  Help from another person bathing (including washing, rinsing, drying)? 3  Help from another person to put on and taking off regular upper body clothing? 4  Help from another person to put on and taking off regular lower body clothing? 2  6 Click Score 19  OT Recommendation  Follow Up Recommendations Home health OT;Supervision/Assistance - 24 hour  OT Equipment 3 in 1 bedside commode (for use in shower)  Individuals Consulted  Consulted and Agree with Results and Recommendations Patient  Acute Rehab OT Goals  Patient Stated Goal to go home  OT Goal Formulation With patient  Time For Goal Achievement 08/09/19  Potential to Achieve Goals Good  OT Time Calculation  OT Start Time (ACUTE ONLY) 1051  OT Stop Time (ACUTE ONLY) 1119  OT Time Calculation (min) 28 min  OT General  Charges  $OT Visit 1 Visit  OT Evaluation  $OT Eval Moderate Complexity 1 Mod  OT Treatments  $Self Care/Home Management  8-22 mins   Lou Cal, OT Supplemental Rehabilitation Services Pager [phone] Office [phone]

## 2019-07-26 NOTE — Progress Notes (Signed)
Pharmacy Medication Storage Note  Storing home medications for [name] [name] in pharmacy secured storage.   Medication storage bag number: [id number]  Delivered to pharmacy @ 15:05 (time) by Candace Cruise (RN name)  Medications will be returned to patient/caregiver upon discharge.  Peggyann Juba, PharmD, BCPS Pharmacy: [phone] 07/26/19 3:06 PM

## 2019-07-27 LAB — CBC WITH DIFFERENTIAL/PLATELET
Abs Immature Granulocytes: 0.3 10*3/uL — ABNORMAL HIGH (ref 0.00–0.07)
Basophils Absolute: 0 10*3/uL (ref 0.0–0.1)
Basophils Relative: 1 %
Eosinophils Absolute: 0 10*3/uL (ref 0.0–0.5)
Eosinophils Relative: 0 %
HCT: 32.5 % — ABNORMAL LOW (ref 39.0–52.0)
Hemoglobin: 10.5 g/dL — ABNORMAL LOW (ref 13.0–17.0)
Immature Granulocytes: 6 %
Lymphocytes Relative: 11 %
Lymphs Abs: 0.6 10*3/uL — ABNORMAL LOW (ref 0.7–4.0)
MCH: 30.3 pg (ref 26.0–34.0)
MCHC: 32.3 g/dL (ref 30.0–36.0)
MCV: 93.9 fL (ref 80.0–100.0)
Monocytes Absolute: 0.4 10*3/uL (ref 0.1–1.0)
Monocytes Relative: 7 %
Neutro Abs: 4 10*3/uL (ref 1.7–7.7)
Neutrophils Relative %: 75 %
Platelets: 228 10*3/uL (ref 150–400)
RBC: 3.46 MIL/uL — ABNORMAL LOW (ref 4.22–5.81)
RDW: 14.4 % (ref 11.5–15.5)
WBC: 5.3 10*3/uL (ref 4.0–10.5)
nRBC: 0.6 % — ABNORMAL HIGH (ref 0.0–0.2)

## 2019-07-27 LAB — COMPREHENSIVE METABOLIC PANEL
ALT: 23 U/L (ref 0–44)
AST: 32 U/L (ref 15–41)
Albumin: 2.4 g/dL — ABNORMAL LOW (ref 3.5–5.0)
Alkaline Phosphatase: 45 U/L (ref 38–126)
Anion gap: 11 (ref 5–15)
BUN: 22 mg/dL (ref 8–23)
CO2: 27 mmol/L (ref 22–32)
Calcium: 7.7 mg/dL — ABNORMAL LOW (ref 8.9–10.3)
Chloride: 103 mmol/L (ref 98–111)
Creatinine, Ser: 0.85 mg/dL (ref 0.61–1.24)
GFR calc Af Amer: >60 mL/min (ref >60)
GFR calc non Af Amer: >60 mL/min (ref >60)
Glucose, Bld: 84 mg/dL (ref 70–99)
Potassium: 3.3 mmol/L — ABNORMAL LOW (ref 3.5–5.1)
Sodium: 141 mmol/L (ref 135–145)
Total Bilirubin: 0.8 mg/dL (ref 0.3–1.2)
Total Protein: 5.8 g/dL — ABNORMAL LOW (ref 6.5–8.1)

## 2019-07-27 LAB — D-DIMER, QUANTITATIVE: D-Dimer, Quant: 1.27 ug/mL-FEU — ABNORMAL HIGH (ref 0.00–0.50)

## 2019-07-27 LAB — FERRITIN: Ferritin: 179 ng/mL (ref 24–336)

## 2019-07-27 LAB — PROCALCITONIN: Procalcitonin: 1.73 ng/mL

## 2019-07-27 LAB — MAGNESIUM: Magnesium: 1.7 mg/dL (ref 1.7–2.4)

## 2019-07-27 LAB — C-REACTIVE PROTEIN: CRP: 8 mg/dL — ABNORMAL HIGH (ref <1.0)

## 2019-07-27 MED ORDER — POTASSIUM CHLORIDE CRYS ER 20 MEQ PO TBCR
40.0000 meq | EXTENDED_RELEASE_TABLET | Freq: Once | ORAL | Status: AC
  Administered 2019-07-27: 10:00:00 40 meq via ORAL
  Filled 2019-07-27: qty 2

## 2019-07-27 MED ORDER — DEXAMETHASONE 6 MG PO TABS: 6.0000 mg | ORAL_TABLET | ORAL | 0 refills | Status: AC

## 2019-07-27 MED ORDER — ACETAMINOPHEN 325 MG PO TABS: 650.0000 mg | ORAL_TABLET | Freq: Four times a day (QID) | ORAL | Status: DC | PRN

## 2019-07-27 NOTE — Discharge Summary (Signed)
DISCHARGE SUMMARY  [name] [name]  MR#: [id number]  DOB:June 04, 1941  Date of Admission: 07/25/2019 Date of Discharge: 07/27/2019  Attending Physician:Carden Teel Hennie Duos, MD  Patient's QP:[id number], No Pcp Per  Consults: none  Disposition: D/C home   Date of Positive COVID Test: 07/23/2019  Date Quarantine Ends: 08/13/2019  COVID-19 specific Treatment: Decadron 11/30 > 12/9 Remdesivir 11/30 > 12/4  Follow-up Appts: Follow-up Information    [facility] Follow up.   Specialty: Home Health Services Why: For home health services. They will call you in 1-2 days to set up home health services.  Contact information: [address] [place] [address] [phone]        [facility] Follow up.   Why: For home oxygen. This will be delivered to your home on the day of discharge. You will also be provided with oxygen to go home with.  Contact information: [place] [address] [phone]           Tests Needing Follow-up: -check K+ level  -assess BP control - Norvasc and HCTZ discontinued this admit   Discharge Diagnoses: Covid pneumonia Acute hypoxic respiratory failure Suspected bacterial PNA Persistent hypokalemia Diarrhea Hypertension Hyperlipidemia Prostate cancer  Initial presentation: 78 year old with a history of prostate cancer, HTN, and HLD who presented to the ER with shortness of breath and severe generalized weakness to the point that he was too weak to get out of his own car and actually slept in it. On presentation to the ED he was found to be Covid positive with a fever of 101.6 and saturations of 86% on room air. A CXR noted bilateral pulmonary infiltrates.  Hospital Course:  Covid pneumonia-acute hypoxic respiratory failure Has improved during his hospital stay but does still desaturate with exertion and therefore home oxygen has been arranged at time of discharge -has completed the  course of remdesivir  -will complete a 10-day course of Decadron as an outpatient- no Actemra given concern for bacterial pneumonia -clinically stable at time of discharge and cleared for home health by PT/OT  Recent Labs  Lab 07/23/19 1324 07/23/19 2030 07/24/19 0528 07/25/19 0725 07/26/19 0440 07/27/19 0027  DDIMER  --   --   --  1.22* 1.01* 1.27*  FERRITIN  --  266 280 371* 267 179  CRP  --  28.6* 35.1* 23.5* 11.8* 8.0*  ALT 20  --  19 20 19 23   PROCALCITON 5.36 7.22  --  5.72  --  1.73    Suspected bacterial PNA Received 2 days of empiric antibiotic due to elevated procalcitonin -procalcitonin trended downward -patient clinically without any symptoms of a bacterial pneumonia at time of discharge having been off antibiotics for an extended period  Persistent hypokalemia Supplement further and follow-likely due to poor oral intake and GI lossesin the setting of ongoing use of HCTZ - magnesium is normal -supplementation to continue after discharge  Diarrhea Likely Covid gastroenteritis-Imodium as needed-resolved at time of discharge  Hypertension Follow-up of blood pressure will be indicated as Norvasc and HCTZ were discontinued this admission   Hyperlipidemia Resumed usual medical tx at time of d/c   Prostate cancer Continue usual home medical therapy  Allergies as of 07/27/2019   No Known Allergies     Medication List    STOP taking these medications   amLODipine 5 MG tablet Commonly known as: NORVASC   hydrochlorothiazide 12.5 MG capsule Commonly known as: MICROZIDE     TAKE these medications  acetaminophen 325 MG tablet Commonly known as: TYLENOL Take 2 tablets (650 mg total) by mouth every 6 (six) hours as needed for mild pain or headache (fever >/= 101).   allopurinol 100 MG tablet Commonly known as: ZYLOPRIM Take 100 mg by mouth daily.   atorvastatin 10 MG tablet Commonly known as: LIPITOR Take 10 mg by mouth daily at 6 PM.    darolutamide 300 MG tablet Commonly known as: NUBEQA Take 600 mg by mouth 2 (two) times daily.   dexamethasone 6 MG tablet Commonly known as: DECADRON Take 1 tablet (6 mg total) by mouth daily for 5 days. Start taking on: July 28, 2019   famotidine 20 MG tablet Commonly known as: PEPCID Take 20 mg by mouth daily.   oxyCODONE 5 MG immediate release tablet Commonly known as: Oxy IR/ROXICODONE Take 5 mg by mouth 2 (two) times daily as needed for pain.   potassium chloride SA 20 MEQ tablet Commonly known as: KLOR-CON Take 20 mEq by mouth daily.            Durable Medical Equipment  (From admission, onward)         Start     Ordered   07/26/19 1356  For home use only DME oxygen  Once    Question Answer Comment  Length of Need 6 Months   Mode or (Route) Nasal cannula   Liters per Minute 2   Frequency Continuous (stationary and portable oxygen unit needed)   Oxygen conserving device Yes   Oxygen delivery system Gas      07/26/19 1356          Day of Discharge BP (!) 166/92 (BP Location: Right Arm)   Pulse 82   Temp 99.1 F (37.3 C) (Oral)   Resp 18   Ht 5\' 7"  (1.702 m)   Wt 77.1 kg   SpO2 93%   BMI 26.63 kg/m   Physical Exam: General: No acute respiratory distress Lungs: Clear to auscultation bilaterally without wheezes or crackles Cardiovascular: Regular rate and rhythm without murmur gallop or rub normal S1 and S2 Abdomen: Nontender, nondistended, soft, bowel sounds positive, no rebound, no ascites, no appreciable mass Extremities: No significant cyanosis, clubbing, or edema bilateral lower extremities  Basic Metabolic Panel: Recent Labs  Lab 07/23/19 1320 07/23/19 2030 07/24/19 0528 07/25/19 0725 07/26/19 0440 07/27/19 0027  NA 137  --  140 144 143 141  K 2.6*  --  3.2* 2.9* 3.0* 3.3*  CL 96*  --  102 102 106 103  CO2 25  --  23 28 27 27   GLUCOSE 96  --  93 125* 107* 84  BUN 30*  --  24* 25* 23 22  CREATININE 1.43* 1.24 0.93 0.88 0.86  0.85  CALCIUM 8.2*  --  8.0* 8.2* 7.9* 7.7*  MG  --   --   --   --  2.0 1.7    Liver Function Tests: Recent Labs  Lab 07/23/19 1324 07/24/19 0528 07/25/19 0725 07/26/19 0440 07/27/19 0027  AST 38 38 34 27 32  ALT 20 19 20 19 23   ALKPHOS 60 49 55 47 45  BILITOT 1.0 0.7 0.5 0.9 0.8  PROT 7.3 6.7 6.4* 5.8* 5.8*  ALBUMIN 3.2* 2.8* 2.6* 2.5* 2.4*   Recent Labs  Lab 07/23/19 1324  LIPASE 53*   CBC: Recent Labs  Lab 07/23/19 2030 07/24/19 0528 07/25/19 0725 07/26/19 0440 07/27/19 0027  WBC 13.2* 11.6* 8.4 6.1 5.3  NEUTROABS  --  10.4* 7.2 5.2 4.0  HGB 11.1* 11.6* 11.2* 10.4* 10.5*  HCT 34.0* 35.6* 34.2* 32.5* 32.5*  MCV 92.9 93.4 93.2 93.9 93.9  PLT 140* 159 208 210 228    BNP (last 3 results) Recent Labs    07/25/19 0725  BNP 85.5    CBG: Recent Labs  Lab 07/26/19 0745  GLUCAP 102*    Recent Results (from the past 240 hour(s))  Blood culture (routine x 2)     Status: None (Preliminary result)   Collection Time: 07/23/19  1:30 PM   Specimen: BLOOD  Result Value Ref Range Status   Specimen Description BLOOD RIGHT ANTECUBITAL  Final   Special Requests   Final    BOTTLES DRAWN AEROBIC AND ANAEROBIC Blood Culture results may not be optimal due to an excessive volume of blood received in culture bottles   Culture   Final    NO GROWTH 4 DAYS Performed at [facility], [address] [place], [place] [address]    Report Status PENDING  Incomplete  Blood culture (routine x 2)     Status: None (Preliminary result)   Collection Time: 07/23/19  2:15 PM   Specimen: BLOOD  Result Value Ref Range Status   Specimen Description BLOOD BLOOD LEFT WRIST  Final   Special Requests   Final    BOTTLES DRAWN AEROBIC AND ANAEROBIC Blood Culture adequate volume   Culture   Final    NO GROWTH 4 DAYS Performed at [facility], [address] [place], [place] [address]    Report Status PENDING  Incomplete  Respiratory Panel by PCR     Status: None    Collection Time: 07/23/19  8:30 PM   Specimen: Nasopharyngeal Swab; Respiratory  Result Value Ref Range Status   Adenovirus NOT DETECTED NOT DETECTED Final   Coronavirus 229E NOT DETECTED NOT DETECTED Final    Comment: (NOTE) The Coronavirus on the Respiratory Panel, DOES NOT test for the novel  Coronavirus (2019 nCoV)    Coronavirus HKU1 NOT DETECTED NOT DETECTED Final   Coronavirus NL63 NOT DETECTED NOT DETECTED Final   Coronavirus OC43 NOT DETECTED NOT DETECTED Final   Metapneumovirus NOT DETECTED NOT DETECTED Final   Rhinovirus / Enterovirus NOT DETECTED NOT DETECTED Final   Influenza A NOT DETECTED NOT DETECTED Final   Influenza B NOT DETECTED NOT DETECTED Final   Parainfluenza Virus 1 NOT DETECTED NOT DETECTED Final   Parainfluenza Virus 2 NOT DETECTED NOT DETECTED Final   Parainfluenza Virus 3 NOT DETECTED NOT DETECTED Final   Parainfluenza Virus 4 NOT DETECTED NOT DETECTED Final   Respiratory Syncytial Virus NOT DETECTED NOT DETECTED Final   Bordetella pertussis NOT DETECTED NOT DETECTED Final   Chlamydophila pneumoniae NOT DETECTED NOT DETECTED Final   Mycoplasma pneumoniae NOT DETECTED NOT DETECTED Final    Comment: Performed at [facility], [address], [place], [place] [address]  Urine culture     Status: Abnormal   Collection Time: 07/23/19  8:47 PM   Specimen: In/Out Cath Urine  Result Value Ref Range Status   Specimen Description   Final    IN/OUT CATH URINE Performed at [facility], [address], [place], [place] [address]    Special Requests   Final    NONE Performed at [facility], [address] [place],  [address]    Culture MULTIPLE SPECIES PRESENT, SUGGEST RECOLLECTION (A)  Final   Report Status 07/24/2019 FINAL  Final  Time spent in discharge (includes decision making & examination of pt): 35 minutes  07/27/2019, 12:42 PM   Cherene Altes, MD [facility] Office  [phone]

## 2019-07-27 NOTE — Progress Notes (Signed)
Spoke with the son to check the status of patient transportation for discharge.  He was not aware that his dad was being discharged, and instructed to me to call the patients wife.  I called the 2 numbers listed in the chart.  I did not receive an answer when I called the home number, so I tried the work number and was told this was not the correct number.    I called the son back, and he gave me an alternate number that is not in the chart:  [phone].  I did not receive an answer but I left a voicemail requesting a call back.   Tried calling the son again, but the call went to VM.  I left a message stating I could not get through.   I have talked to the patient and explained that we are still trying to get his ride.  He said he will think of who else can come get him.   I have given report to the oncoming nurse and have explained the situation.

## 2019-07-27 NOTE — Progress Notes (Signed)
Received phone call from patient's son, Erjon Kluesner [phone].  Patient's son reported to Charge RN that he was not notified of his father's discharge by primary RN until 1800 tonight.  Son lives in [place], [place] and voices concern of late notification of discharge today and is requesting to speak with both Case Manager and Dr. Thereasa Solo as soon as possible Saturday morning.

## 2019-07-27 NOTE — Progress Notes (Signed)
MD called back. MD is aware of issue with pt now. No new orders at this time

## 2019-07-27 NOTE — Progress Notes (Signed)
Patient discharge order reviewed complete.  Patient is stable on 2L [place] with oxygen at bedside to go home.    Home medications returned to patient. Receipt signed and in chart.

## 2019-07-27 NOTE — Discharge Instructions (Signed)
Date of Positive COVID Test: 07/23/2019  Date Quarantine Ends: 08/13/2019    COVID-19 COVID-19 is a respiratory infection that is caused by a virus called severe acute respiratory syndrome coronavirus 2 (SARS-CoV-2). The disease is also known as coronavirus disease or novel coronavirus. In some people, the virus may not cause any symptoms. In others, it may cause a serious infection. The infection can get worse quickly and can lead to complications, such as:  Pneumonia, or infection of the lungs.  Acute respiratory distress syndrome or ARDS. This is fluid build-up in the lungs.  Acute respiratory failure. This is a condition in which there is not enough oxygen passing from the lungs to the body.  Sepsis or septic shock. This is a serious bodily reaction to an infection.  Blood clotting problems.  Secondary infections due to bacteria or fungus. The virus that causes COVID-19 is contagious. This means that it can spread from person to person through droplets from coughs and sneezes (respiratory secretions). What are the causes? This illness is caused by a virus. You may catch the virus by:  Breathing in droplets from an infected person's cough or sneeze.  Touching something, like a table or a doorknob, that was exposed to the virus (contaminated) and then touching your mouth, nose, or eyes. What increases the risk? Risk for infection You are more likely to be infected with this virus if you:  Live in or travel to an area with a COVID-19 outbreak.  Come in contact with a sick person who recently traveled to an area with a COVID-19 outbreak.  Provide care for or live with a person who is infected with COVID-19. Risk for serious illness You are more likely to become seriously ill from the virus if you:  Are 20 years of age or older.  Have a long-term disease that lowers your body's ability to fight infection (immunocompromised).  Live in a nursing home or long-term care  facility.  Have a long-term (chronic) disease such as: ? Chronic lung disease, including chronic obstructive pulmonary disease or asthma ? Heart disease. ? Diabetes. ? Chronic kidney disease. ? Liver disease.  Are obese. What are the signs or symptoms? Symptoms of this condition can range from mild to severe. Symptoms may appear any time from 2 to 14 days after being exposed to the virus. They include:  A fever.  A cough.  Difficulty breathing.  Chills.  Muscle pains.  A sore throat.  Loss of taste or smell. Some people may also have stomach problems, such as nausea, vomiting, or diarrhea. Other people may not have any symptoms of COVID-19. How is this diagnosed? This condition may be diagnosed based on:  Your signs and symptoms, especially if: ? You live in an area with a COVID-19 outbreak. ? You recently traveled to or from an area where the virus is common. ? You provide care for or live with a person who was diagnosed with COVID-19.  A physical exam.  Lab tests, which may include: ? A nasal swab to take a sample of fluid from your nose. ? A throat swab to take a sample of fluid from your throat. ? A sample of mucus from your lungs (sputum). ? Blood tests.  Imaging tests, which may include, X-rays, CT scan, or ultrasound. How is this treated? At present, there is no medicine to treat COVID-19. Medicines that treat other diseases are being used on a trial basis to see if they are effective against COVID-19. Your health  care provider will talk with you about ways to treat your symptoms. For most people, the infection is mild and can be managed at home with rest, fluids, and over-the-counter medicines. Treatment for a serious infection usually takes places in a hospital intensive care unit (ICU). It may include one or more of the following treatments. These treatments are given until your symptoms improve.  Receiving fluids and medicines through an  IV.  Supplemental oxygen. Extra oxygen is given through a tube in the nose, a face mask, or a hood.  Positioning you to lie on your stomach (prone position). This makes it easier for oxygen to get into the lungs.  Continuous positive airway pressure (CPAP) or bi-level positive airway pressure (BPAP) machine. This treatment uses mild air pressure to keep the airways open. A tube that is connected to a motor delivers oxygen to the body.  Ventilator. This treatment moves air into and out of the lungs by using a tube that is placed in your windpipe.  Tracheostomy. This is a procedure to create a hole in the neck so that a breathing tube can be inserted.  Extracorporeal membrane oxygenation (ECMO). This procedure gives the lungs a chance to recover by taking over the functions of the heart and lungs. It supplies oxygen to the body and removes carbon dioxide. Follow these instructions at home: Lifestyle  If you are sick, stay home except to get medical care. Your health care provider will tell you how long to stay home. Call your health care provider before you go for medical care.  Rest at home as told by your health care provider.  Do not use any products that contain nicotine or tobacco, such as cigarettes, e-cigarettes, and chewing tobacco. If you need help quitting, ask your health care provider.  Return to your normal activities as told by your health care provider. Ask your health care provider what activities are safe for you. General instructions  Take over-the-counter and prescription medicines only as told by your health care provider.  Drink enough fluid to keep your urine pale yellow.  Keep all follow-up visits as told by your health care provider. This is important. How is this prevented?  There is no vaccine to help prevent COVID-19 infection. However, there are steps you can take to protect yourself and others from this virus. To protect yourself:   Do not travel to areas  where COVID-19 is a risk. The areas where COVID-19 is reported change often. To identify high-risk areas and travel restrictions, check the CDC travel website: FatFares.com.br  If you live in, or must travel to, an area where COVID-19 is a risk, take precautions to avoid infection. ? Stay away from people who are sick. ? Wash your hands often with soap and water for 20 seconds. If soap and water are not available, use an alcohol-based hand sanitizer. ? Avoid touching your mouth, face, eyes, or nose. ? Avoid going out in public, follow guidance from your state and local health authorities. ? If you must go out in public, wear a cloth face covering or face mask. ? Disinfect objects and surfaces that are frequently touched every day. This may include:  Counters and tables.  Doorknobs and light switches.  Sinks and faucets.  Electronics, such as phones, remote controls, keyboards, computers, and tablets. To protect others: If you have symptoms of COVID-19, take steps to prevent the virus from spreading to others.  If you think you have a COVID-19 infection, contact your health  care provider right away. Tell your health care team that you think you may have a COVID-19 infection.  Stay home. Leave your house only to seek medical care. Do not use public transport.  Do not travel while you are sick.  Wash your hands often with soap and water for 20 seconds. If soap and water are not available, use alcohol-based hand sanitizer.  Stay away from other members of your household. Let healthy household members care for children and pets, if possible. If you have to care for children or pets, wash your hands often and wear a mask. If possible, stay in your own room, separate from others. Use a different bathroom.  Make sure that all people in your household wash their hands well and often.  Cough or sneeze into a tissue or your sleeve or elbow. Do not cough or sneeze into your hand or  into the air.  Wear a cloth face covering or face mask. Where to find more information  Centers for Disease Control and Prevention: PurpleGadgets.be  World Health Organization: https://www.castaneda.info/ Contact a health care provider if:  You live in or have traveled to an area where COVID-19 is a risk and you have symptoms of the infection.  You have had contact with someone who has COVID-19 and you have symptoms of the infection. Get help right away if:  You have trouble breathing.  You have pain or pressure in your chest.  You have confusion.  You have bluish lips and fingernails.  You have difficulty waking from sleep.  You have symptoms that get worse. These symptoms may represent a serious problem that is an emergency. Do not wait to see if the symptoms will go away. Get medical help right away. Call your local emergency services (911 in the [place] Do not drive yourself to the hospital. Let the emergency medical personnel know if you think you have COVID-19. Summary  COVID-19 is a respiratory infection that is caused by a virus. It is also known as coronavirus disease or novel coronavirus. It can cause serious infections, such as pneumonia, acute respiratory distress syndrome, acute respiratory failure, or sepsis.  The virus that causes COVID-19 is contagious. This means that it can spread from person to person through droplets from coughs and sneezes.  You are more likely to develop a serious illness if you are 72 years of age or older, have a weak immunity, live in a nursing home, or have chronic disease.  There is no medicine to treat COVID-19. Your health care provider will talk with you about ways to treat your symptoms.  Take steps to protect yourself and others from infection. Wash your hands often and disinfect objects and surfaces that are frequently touched every day. Stay away from people who are sick and wear a mask if you  are sick. This information is not intended to replace advice given to you by your health care provider. Make sure you discuss any questions you have with your health care provider. Document Released: 09/14/2018 Document Revised: 01/04/2019 Document Reviewed: 09/14/2018 Elsevier Patient Education  2020 [employer].   COVID-19 Frequently Asked Questions COVID-19 (coronavirus disease) is an infection that is caused by a large family of viruses. Some viruses cause illness in people and others cause illness in animals like camels, cats, and bats. In some cases, the viruses that cause illness in animals can spread to humans. Where did the coronavirus come from? In December 2019, [place] told the [facility]) of  several cases of lung disease (human respiratory illness). These cases were linked to an open seafood and livestock market in the city of [place]. The link to the seafood and livestock market suggests that the virus may have spread from animals to humans. However, since that first outbreak in December, the virus has also been shown to spread from person to person. What is the name of the disease and the virus? Disease name Early on, this disease was called novel coronavirus. This is because scientists determined that the disease was caused by a new (novel) respiratory virus. The World Health Organization [facility]) has now named the disease COVID-19, or coronavirus disease. Virus name The virus that causes the disease is called severe acute respiratory syndrome coronavirus 2 (SARS-CoV-2). More information on disease and virus naming World Health Organization [facility]): www.who.int/emergencies/diseases/novel-coronavirus-2019/technical-guidance/naming-the-coronavirus-disease-(covid-2019)-and-the-virus-that-causes-it Who is at risk for complications from coronavirus disease? Some people may be at higher risk for complications from coronavirus disease. This includes older adults and people who  have chronic diseases, such as heart disease, diabetes, and lung disease. If you are at higher risk for complications, take these extra precautions:  Avoid close contact with people who are sick or have a fever or cough. Stay at least 3-6 ft (1-2 m) away from them, if possible.  Wash your hands often with soap and water for at least 20 seconds.  Avoid touching your face, mouth, nose, or eyes.  Keep supplies on hand at home, such as food, medicine, and cleaning supplies.  Stay home as much as possible.  Avoid social gatherings and travel. How does coronavirus disease spread? The virus that causes coronavirus disease spreads easily from person to person (is contagious). There are also cases of community-spread disease. This means the disease has spread to:  People who have no known contact with other infected people.  People who have not traveled to areas where there are known cases. It appears to spread from one person to another through droplets from coughing or sneezing. Can I get the virus from touching surfaces or objects? There is still a lot that we do not know about the virus that causes coronavirus disease. Scientists are basing a lot of information on what they know about similar viruses, such as:  Viruses cannot generally survive on surfaces for long. They need a human body (host) to survive.  It is more likely that the virus is spread by close contact with people who are sick (direct contact), such as through: ? Shaking hands or hugging. ? Breathing in respiratory droplets that travel through the air. This can happen when an infected person coughs or sneezes on or near other people.  It is less likely that the virus is spread when a person touches a surface or object that has the virus on it (indirect contact). The virus may be able to enter the body if the person touches a surface or object and then touches his or her face, eyes, nose, or mouth. Can a person spread the virus  without having symptoms of the disease? It may be possible for the virus to spread before a person has symptoms of the disease, but this is most likely not the main way the virus is spreading. It is more likely for the virus to spread by being in close contact with people who are sick and breathing in the respiratory droplets of a sick person's cough or sneeze. What are the symptoms of coronavirus disease? Symptoms vary from person to person and can range  from mild to severe. Symptoms may include:  Fever.  Cough.  Tiredness, weakness, or fatigue.  Fast breathing or feeling short of breath. These symptoms can appear anywhere from 2 to 14 days after you have been exposed to the virus. If you develop symptoms, call your health care provider. People with severe symptoms may need hospital care. If I am exposed to the virus, how long does it take before symptoms start? Symptoms of coronavirus disease may appear anywhere from 2 to 14 days after a person has been exposed to the virus. If you develop symptoms, call your health care provider. Should I be tested for this virus? Your health care provider will decide whether to test you based on your symptoms, history of exposure, and your risk factors. How does a health care provider test for this virus? Health care providers will collect samples to send for testing. Samples may include:  Taking a swab of fluid from the nose.  Taking fluid from the lungs by having you cough up mucus (sputum) into a sterile cup.  Taking a blood sample.  Taking a stool or urine sample. Is there a treatment or vaccine for this virus? Currently, there is no vaccine to prevent coronavirus disease. Also, there are no medicines like antibiotics or antivirals to treat the virus. A person who becomes sick is given supportive care, which means rest and fluids. A person may also relieve his or her symptoms by using over-the-counter medicines that treat sneezing, coughing, and  runny nose. These are the same medicines that a person takes for the common cold. If you develop symptoms, call your health care provider. People with severe symptoms may need hospital care. What can I do to protect myself and my family from this virus?     You can protect yourself and your family by taking the same actions that you would take to prevent the spread of other viruses. Take the following actions:  Wash your hands often with soap and water for at least 20 seconds. If soap and water are not available, use alcohol-based hand sanitizer.  Avoid touching your face, mouth, nose, or eyes.  Cough or sneeze into a tissue, sleeve, or elbow. Do not cough or sneeze into your hand or the air. ? If you cough or sneeze into a tissue, throw it away immediately and wash your hands.  Disinfect objects and surfaces that you frequently touch every day.  Avoid close contact with people who are sick or have a fever or cough. Stay at least 3-6 ft (1-2 m) away from them, if possible.  Stay home if you are sick, except to get medical care. Call your health care provider before you get medical care.  Make sure your vaccines are up to date. Ask your health care provider what vaccines you need. What should I do if I need to travel? Follow travel recommendations from your local health authority, the CDC, and WHO. Travel information and advice  Centers for Disease Control and Prevention (CDC): BodyEditor.hu  World Health Organization [facility]): ThirdIncome.ca Know the risks and take action to protect your health  You are at higher risk of getting coronavirus disease if you are traveling to areas with an outbreak or if you are exposed to travelers from areas with an outbreak.  Wash your hands often and practice good hygiene to lower the risk of catching or spreading the virus. What should I do if I am  sick? General instructions to stop the spread of infection  Wash your hands often with soap and water for at least 20 seconds. If soap and water are not available, use alcohol-based hand sanitizer.  Cough or sneeze into a tissue, sleeve, or elbow. Do not cough or sneeze into your hand or the air.  If you cough or sneeze into a tissue, throw it away immediately and wash your hands.  Stay home unless you must get medical care. Call your health care provider or local health authority before you get medical care.  Avoid public areas. Do not take public transportation, if possible.  If you can, wear a mask if you must go out of the house or if you are in close contact with someone who is not sick. Keep your home clean  Disinfect objects and surfaces that are frequently touched every day. This may include: ? Counters and tables. ? Doorknobs and light switches. ? Sinks and faucets. ? Electronics such as phones, remote controls, keyboards, computers, and tablets.  Wash dishes in hot, soapy water or use a dishwasher. Air-dry your dishes.  Wash laundry in hot water. Prevent infecting other household members  Let healthy household members care for children and pets, if possible. If you have to care for children or pets, wash your hands often and wear a mask.  Sleep in a different bedroom or bed, if possible.  Do not share personal items, such as razors, toothbrushes, deodorant, combs, brushes, towels, and washcloths. Where to find more information Centers for Disease Control and Prevention (CDC)  Information and news updates: https://www.butler-gonzalez.com/ World Health Organization [facility])  Information and news updates: MissExecutive.com.ee  Coronavirus health topic: https://www.castaneda.info/  Questions and answers on COVID-19: OpportunityDebt.at  Global tracker: who.sprinklr.com American Academy of  Pediatrics (AAP)  Information for families: www.healthychildren.org/English/health-issues/conditions/chest-lungs/Pages/2019-Novel-Coronavirus.aspx The coronavirus situation is changing rapidly. Check your local health authority website or the CDC and [facility] websites for updates and news. When should I contact a health care provider?  Contact your health care provider if you have symptoms of an infection, such as fever or cough, and you: ? Have been near anyone who is known to have coronavirus disease. ? Have come into contact with a person who is suspected to have coronavirus disease. ? Have traveled outside of the country. When should I get emergency medical care?  Get help right away by calling your local emergency services (911 in the [place] if you have: ? Trouble breathing. ? Pain or pressure in your chest. ? Confusion. ? Blue-tinged lips and fingernails. ? Difficulty waking from sleep. ? Symptoms that get worse. Let the emergency medical personnel know if you think you have coronavirus disease. Summary  A new respiratory virus is spreading from person to person and causing COVID-19 (coronavirus disease).  The virus that causes COVID-19 appears to spread easily. It spreads from one person to another through droplets from coughing or sneezing.  Older adults and those with chronic diseases are at higher risk of disease. If you are at higher risk for complications, take extra precautions.  There is currently no vaccine to prevent coronavirus disease. There are no medicines, such as antibiotics or antivirals, to treat the virus.  You can protect yourself and your family by washing your hands often, avoiding touching your face, and covering your coughs and sneezes. This information is not intended to replace advice given to you by your health care provider. Make sure you discuss any questions you have with your health care provider. Document Released: 12/05/2018 Document Revised:  12/05/2018 Document Reviewed: 12/05/2018  Elsevier Patient Education  2020 [place]: How to Protect Yourself and Others Know how it spreads  There is currently no vaccine to prevent coronavirus disease 2019 (COVID-19).  The best way to prevent illness is to avoid being exposed to this virus.  The virus is thought to spread mainly from person-to-person. ? Between people who are in close contact with one another (within about 6 feet). ? Through respiratory droplets produced when an infected person coughs, sneezes or talks. ? These droplets can land in the mouths or noses of people who are nearby or possibly be inhaled into the lungs. ? Some recent studies have suggested that COVID-19 may be spread by people who are not showing symptoms. Everyone should Clean your hands often  Wash your hands often with soap and water for at least 20 seconds especially after you have been in a public place, or after blowing your nose, coughing, or sneezing.  If soap and water are not readily available, use a hand sanitizer that contains at least 60% alcohol. Cover all surfaces of your hands and rub them together until they feel dry.  Avoid touching your eyes, nose, and mouth with unwashed hands. Avoid close contact  Stay home if you are sick.  Avoid close contact with people who are sick.  Put distance between yourself and other people. ? Remember that some people without symptoms may be able to spread virus. ? This is especially important for people who are at higher risk of getting very GainPain.com.cy Cover your mouth and nose with a cloth face cover when around others  You could spread COVID-19 to others even if you do not feel sick.  Everyone should wear a cloth face cover when they have to go out in public, for example to the grocery store or to pick up other necessities. ? Cloth face coverings should not  be placed on young children under age 72, anyone who has trouble breathing, or is unconscious, incapacitated or otherwise unable to remove the mask without assistance.  The cloth face cover is meant to protect other people in case you are infected.  Do NOT use a facemask meant for a Dietitian.  Continue to keep about 6 feet between yourself and others. The cloth face cover is not a substitute for social distancing. Cover coughs and sneezes  If you are in a private setting and do not have on your cloth face covering, remember to always cover your mouth and nose with a tissue when you cough or sneeze or use the inside of your elbow.  Throw used tissues in the trash.  Immediately wash your hands with soap and water for at least 20 seconds. If soap and water are not readily available, clean your hands with a hand sanitizer that contains at least 60% alcohol. Clean and disinfect  Clean AND disinfect frequently touched surfaces daily. This includes tables, doorknobs, light switches, countertops, handles, desks, phones, keyboards, toilets, faucets, and sinks. RackRewards.fr  If surfaces are dirty, clean them: Use detergent or soap and water prior to disinfection.  Then, use a household disinfectant. You can see a list of EPA-registered household disinfectants here. [email or web address] 12/26/2018 This information is not intended to replace advice given to you by your health care provider. Make sure you discuss any questions you have with your health care provider. Document Released: 12/05/2018 Document Revised: 01/03/2019 Document Reviewed: 12/05/2018 Elsevier Patient Education  [place].

## 2019-07-27 NOTE — Progress Notes (Signed)
MD paged regarding pt family unaware of discharge today. Requested order to have pt stay w/ or without tele and IV access

## 2019-07-28 LAB — CULTURE, BLOOD (ROUTINE X 2)
Culture: NO GROWTH
Culture: NO GROWTH
Special Requests: ADEQUATE

## 2019-07-28 NOTE — Progress Notes (Signed)
Patient is upset PTAR has not arrived.  Patient using fowl language.

## 2019-07-28 NOTE — TOC Progression Note (Addendum)
Transition of Care [facility]) - Progression Note    Patient Details  Name: [name] [name] MRN: [id number] Date of Birth: 03/19/1941  Transition of Care [facility]) CM/SW [place], [place] Phone Number: [phone] 07/28/2019, 10:55 AM  Clinical Narrative:     CSW spoke with Magda Paganini at [facility] who confirms patient's oxygen was delivered to home and 3in1 will be delivered on Monday 12/7.   CSW spoke with patient's son who expressed concern over yesterday's discharge and lack of receiving discharge information. CSW answered all questions and concerns, [name] [name] appreciative of phone call. He requests additional home health aide through [place], [place] has requested orders. He chooses for patient to be transported by PTAR at 5pm to residence, confirmed address on facesheet. [name] [name] requests call from MD, CSW has informed MD.   No further questions or concerns at this time.   Expected Discharge Plan: [place] Barriers to Discharge: Continued Medical Work up  Expected Discharge Plan and Services Expected Discharge Plan: [place]   Discharge Planning Services: CM Consult Post Acute Care Choice: Home Health   Expected Discharge Date: 07/27/19               DME Arranged: 3-N-1, Oxygen DME Agency: [place] Date DME Agency Contacted: 07/26/19 Time DME Agency Contacted: [phone] Representative spoke with at DME Agency: Magda Paganini HH Arranged: PT, OT [place] Agency: [place] Date [place]: 07/26/19 Time [place]: [place] Representative spoke with at [place]: [place] ([place]) Interventions    Readmission Risk Interventions No flowsheet data found.

## 2019-07-28 NOTE — Progress Notes (Signed)
Spoke with Amadeo Garnet, explained BSC could not be taken by ambulance to the house.  [name] will call [place] in the morning to make arrangement to pick up equipment.

## 2019-07-28 NOTE — Progress Notes (Signed)
[name] [name]  [id number] DOB: 17-Mar-1941 DOA: 07/25/2019 PCP: Patient, No Pcp Per    Brief Narrative:  78 year old with a history of prostate cancer, HTN, and HLD who presented to the ER with shortness of breath and severe generalized weakness to the point that he was too weak to get out of his own car and actually slept in it.  On presentation to the ED he was found to be Covid positive with a fever of 101.6 and saturations of 86% on room air.  A CXR noted bilateral pulmonary infiltrates.  Significant Events: 11/30 admit to [facility] via [facility] ED 12/2 transfer to [facility]  COVID-19 specific Treatment: Decadron 11/30 > Remdesivir 11/30 > 12/4  Subjective: Patient was seen for a follow-up visit.  His discharge was held up yesterday due to strategic issues and not medical issues.  He remains medically stable and ready for discharge.  Assessment & Plan:  Covid pneumonia-acute hypoxic respiratory failure Has improved during his hospital stay but does still desaturate with exertion and therefore home oxygen has been arranged at time of discharge -has completed the course of remdesivir  -will complete a 10-day course of Decadron as an outpatient- no Actemra given concern for bacterial pneumonia -clinically stable at time of discharge and cleared for home health by PT/OT  Last Labs           Recent Labs  Lab 07/23/19 1324 07/23/19 2030 07/24/19 0528 07/25/19 0725 07/26/19 0440 07/27/19 0027  DDIMER  --   --   --  1.22* 1.01* 1.27*  FERRITIN  --  266 280 371* 267 179  CRP  --  28.6* 35.1* 23.5* 11.8* 8.0*  ALT 20  --  19 20 19 23   PROCALCITON 5.36 7.22  --  5.72  --  1.73      Suspected bacterial PNA Received 2 days of empiric antibiotic due to elevated procalcitonin-procalcitonin trended downward -patient clinically without any symptoms of a bacterial pneumonia at time of discharge having been off antibiotics for an extended period  Persistent hypokalemia due to poor  oral intake and GI lossesin the setting of ongoing use of HCTZ - magnesium is normal -supplementation to continue after discharge  Diarrhea Likely Covid gastroenteritis-Imodium as needed-resolved at time of discharge  Hypertension Follow-up of blood pressure will be indicated as Norvasc and HCTZ were discontinued this admission   Hyperlipidemia Resumed usual medical tx at time of d/c   Prostate cancer Continue usual home medical therapy   DVT prophylaxis: Lovenox Code Status: FULL CODE Family Communication:  Disposition Plan: Telemetry bed  Consultants:  none  Antimicrobials:  Azithromycin 11/30 > 12/1 Rocephin 11/30 > 12/1  Objective: Blood pressure (!) 144/91, pulse 84, temperature 99 F (37.2 C), temperature source Oral, resp. rate 18, height 5\' 7"  (1.702 m), weight 77.1 kg, SpO2 95 %.  Intake/Output Summary (Last 24 hours) at 07/28/2019 1426 Last data filed at 07/28/2019 0819 Gross per 24 hour  Intake 760 ml  Output 550 ml  Net 210 ml   Filed Weights   07/25/19 0544  Weight: 77.1 kg    Examination: General: No acute respiratory distress Lungs: good air movement th/o  Cardiovascular: RRR Extremities: No significant cyanosis, clubbing, or edema bilateral lower extremities  CBC: Recent Labs  Lab 07/25/19 0725 07/26/19 0440 07/27/19 0027  WBC 8.4 6.1 5.3  NEUTROABS 7.2 5.2 4.0  HGB 11.2* 10.4* 10.5*  HCT 34.2* 32.5* 32.5*  MCV 93.2 93.9 93.9  PLT 208 210 228  Basic Metabolic Panel: Recent Labs  Lab 07/25/19 0725 07/26/19 0440 07/27/19 0027  NA 144 143 141  K 2.9* 3.0* 3.3*  CL 102 106 103  CO2 28 27 27   GLUCOSE 125* 107* 84  BUN 25* 23 22  CREATININE 0.88 0.86 0.85  CALCIUM 8.2* 7.9* 7.7*  MG  --  2.0 1.7   GFR: Estimated Creatinine Clearance: 68 mL/min (by C-G formula based on SCr of 0.85 mg/dL).  Liver Function Tests: Recent Labs  Lab 07/24/19 0528 07/25/19 0725 07/26/19 0440 07/27/19 0027  AST 38 34 27 32  ALT 19 20 19  23   ALKPHOS 49 55 47 45  BILITOT 0.7 0.5 0.9 0.8  PROT 6.7 6.4* 5.8* 5.8*  ALBUMIN 2.8* 2.6* 2.5* 2.4*   Recent Labs  Lab 07/23/19 1324  LIPASE 53*    CBG: Recent Labs  Lab 07/26/19 0745  GLUCAP 102*    Recent Results (from the past 240 hour(s))  Blood culture (routine x 2)     Status: None   Collection Time: 07/23/19  1:30 PM   Specimen: BLOOD  Result Value Ref Range Status   Specimen Description BLOOD RIGHT ANTECUBITAL  Final   Special Requests   Final    BOTTLES DRAWN AEROBIC AND ANAEROBIC Blood Culture results may not be optimal due to an excessive volume of blood received in culture bottles   Culture   Final    NO GROWTH 5 DAYS Performed at [facility], [address] [place], [place] [address]    Report Status 07/28/2019 FINAL  Final  Blood culture (routine x 2)     Status: None   Collection Time: 07/23/19  2:15 PM   Specimen: BLOOD  Result Value Ref Range Status   Specimen Description BLOOD BLOOD LEFT WRIST  Final   Special Requests   Final    BOTTLES DRAWN AEROBIC AND ANAEROBIC Blood Culture adequate volume   Culture   Final    NO GROWTH 5 DAYS Performed at [facility], [address]  [place], [place] [address]    Report Status 07/28/2019 FINAL  Final  Respiratory Panel by PCR     Status: None   Collection Time: 07/23/19  8:30 PM   Specimen: Nasopharyngeal Swab; Respiratory  Result Value Ref Range Status   Adenovirus NOT DETECTED NOT DETECTED Final   Coronavirus 229E NOT DETECTED NOT DETECTED Final    Comment: (NOTE) The Coronavirus on the Respiratory Panel, DOES NOT test for the novel  Coronavirus (2019 nCoV)    Coronavirus HKU1 NOT DETECTED NOT DETECTED Final   Coronavirus NL63 NOT DETECTED NOT DETECTED Final   Coronavirus OC43 NOT DETECTED NOT DETECTED Final   Metapneumovirus NOT DETECTED NOT DETECTED Final   Rhinovirus / Enterovirus NOT DETECTED NOT DETECTED Final   Influenza A NOT DETECTED NOT DETECTED Final   Influenza  B NOT DETECTED NOT DETECTED Final   Parainfluenza Virus 1 NOT DETECTED NOT DETECTED Final   Parainfluenza Virus 2 NOT DETECTED NOT DETECTED Final   Parainfluenza Virus 3 NOT DETECTED NOT DETECTED Final   Parainfluenza Virus 4 NOT DETECTED NOT DETECTED Final   Respiratory Syncytial Virus NOT DETECTED NOT DETECTED Final   Bordetella pertussis NOT DETECTED NOT DETECTED Final   Chlamydophila pneumoniae NOT DETECTED NOT DETECTED Final   Mycoplasma pneumoniae NOT DETECTED NOT DETECTED Final    Comment: Performed at [facility], [address], [place], [place] [address]  Urine culture     Status: Abnormal  Collection Time: 07/23/19  8:47 PM   Specimen: In/Out Cath Urine  Result Value Ref Range Status   Specimen Description   Final    IN/OUT CATH URINE Performed at [facility], [address] [place], [place] [address]    Special Requests   Final    NONE Performed at [facility], [address] [place], [place] [address]    Culture MULTIPLE SPECIES PRESENT, SUGGEST RECOLLECTION (A)  Final   Report Status 07/24/2019 FINAL  Final     Scheduled Meds: . allopurinol  100 mg Oral Daily  . darolutamide  600 mg Oral BID WC  . dexamethasone  6 mg Oral Q24H  . docusate sodium  100 mg Oral Daily  . enoxaparin (LOVENOX) injection  40 mg Subcutaneous Q24H  . Ipratropium-Albuterol  1 puff Inhalation Q6H  . vitamin C  500 mg Oral Daily  . zinc sulfate  220 mg Oral Daily      LOS: 3 days   Cherene Altes, MD [facility] Office  [phone] Pager - Text Page per Amion  If 7PM-7AM, please contact night-coverage per Amion 07/28/2019, 2:26 PM

## 2019-08-01 NOTE — Unmapped (Signed)
08/01/19 Copied into chart from CareEverywhere:    Respiratory Rate 19 07/28/2019 8:23 PM EST ??   Oxygen Saturation 97% 07/28/2019 8:23 PM EST ??   Inhaled Oxygen Concentration - - ??                     Discharge Summaries  - documented in this encounter  Lonia Blood, MD - 07/27/2019 9:43 AM EST  Formatting of this note might be different from the original.    DISCHARGE SUMMARY  Stephen Bartlett  MR#: [id number]  DOB:11-26-1940   Date of Admission: 07/25/2019  Date of Discharge: 07/27/2019    Attending Physician:Jeffrey Silvestre Gunner, MD    Disposition: D/C home   Date of Positive COVID Test:  07/23/2019    Date Quarantine Ends:  08/13/2019    COVID-19 specific Treatment:  Decadron 11/30 > 12/9  Remdesivir 11/30 > 12/4    Follow-up Appts:  Follow-up Information   [facility] Follow up.   Specialty: Home Health Services  Why: For home health services. They will call you in 1-2 days to set up home health services.    Contact information:  [address]  [address]  [place] [address]  [phone]    [facility] Follow up.     ----------------    08/01/19:   Phone call to check in on post discharge recovery. Left voicemail message for NN contact # for assistance as needed.     [place] return visits are scheduled for:  08/20/19:  Hematology Pam Drown  09/13/19:  Lucienne Minks GU ONC Dr.Basch    Lucia Gaskins RN   Per diem NN

## 2019-08-02 NOTE — Unmapped (Signed)
Mrs. [name] spoke to me several times today to share her concerns that her spouse needs more support at home.  First conversation was to share that he is not taking his medications like he should and that she needs more support in taking care of him.  He was discharged home over the weekend and diagnosed with Covid with pulmonary and GI involvement.  He is new to home oxygen.  NP spoke to Mr. Katt and he verbalized that he could take the medicines from the prescription bottles until the nurse puts them in the pill box.  NP spoke to [place] at [facility].  Apparently the only HH services involved are the physical and occupational therapy team.  Requested both the home health RN, social worker and [facility] aide. Explained the conversation about needing support with medication administration and ADLs.    Mrs. Parslow then called me later in the day stating that both her landline and cell phone were no longer working and feels that it is related to the home oxygen set up.  NP spoke to [place] and they do not see a relationship.  Possibility that the electrical circuits are overloaded, but [facility] has no role in supporting this concern.  Their primary concern is to make sure that the oxygen is working.  NP spoke to Sharlet Salina, Child psychotherapist for more support.  Mrs. Wroe ended up calling me on her daughter's cell phone which is [phone].  Provided this number to [facility] for them to be able to schedule appointments.  They are going to try to have a home health nurse go out tomorrow.  NP also left a message for patient's son, [name] to share updates from today's conversations with Mrs. Matty.     NP will follow up later this week as well.      Burtis Junes, FNP-BC, [facility]  Outpatient Oncology Palliative Care Service  [facility]  [address], [place], [place] [address]  [phone]

## 2019-08-02 NOTE — Unmapped (Signed)
NP spoke with son 08/01/19 and shared with him the issues concerning the phones with his Dad. Shared that the contact now is Mrs. Macfadden's dtr's cell number. [name] is in the process of getting this Dad a flip phone for him as well. He will follow up with the phones.    Burtis Junes, FNP-BC, [facility]  Outpatient Oncology Palliative Care Service  [facility]  [address], [place], [place] [address]  [phone]

## 2019-08-03 NOTE — Unmapped (Signed)
Stephen Bartlett with [facility] HH contacted the Communication Center requesting to speak with the care team of Stephen Bartlett to discuss:    Requesting verbal order for PT.    1x week/2 weeks, 2xweek/2 weeks, and 1xweek/3 weeks.    Please contact [place] at [phone].    Check Indicates criteria has been reviewed and confirmed with the patient:    [x]  Preferred Name   [x]  DOB and/or MR#  [x]  Preferred Contact Method  [x]  Phone Number(s)   []  MyChart     Thank you,   Laverna Peace  [facility] Communication Center   [phone]

## 2019-08-06 NOTE — Unmapped (Signed)
08/06/19:    The orginal referral to Home Health and PT did not come from [facility]; it was initiated by another hospital at discharge.  Therefore, [place] / Dr.Basch is not the ordering physician.      Left a detailed VM for [facility] with instructions to contact the

## 2019-08-06 NOTE — Unmapped (Signed)
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  Also has neuroendocrine tumor grade 1 pancreatic mass. Restaging scans 06/2019 and 10/2018 have been stable post-Radium. His pain is generally well controlled with PRN analgesics, and mainly is in his back at the area of prior surgery where he has a rod, it is worse when it is rainy.  Despite stable scans, PSA has been rising, from 31 in 08/2018 to 60 in 05/2019. Patient was also recently admitted for COVID on 11/30 and discharged 12/02.     1.mCRPC - patient is to start Darolutamide 600 mg PO BID with meals once he able to obtain new supply.   - F/U with delivery of new patient supply (patient lost 07/24/19 supply)    2. Pain - pain is managed with Oxycodone 5 mg PO BID PNR     3. HTN    - Purchase BP cuff  - Continue Amlodpine 5 mg PO daily and hydrochlorothiazide 12.5 mg PO daily     F/u: 2 weeks with pharmacy and 12/28 with Pam Drown, FNP.     New Start Darolutamide Education    Mr.Radell is a 78 y.o. male with mCRPC who I am counseling today on initiation of oral hormonal therapy.    Oral hormonal therapy regimen: Darolutamide 600 mg BID with meals  Start Date: N/A - Patient lost supply of medication and will need an insurance override to have new delivery of medication. [facility] will be contacted.     Side effects discussed included but were not limited to: fatigue, hypertension, hot flashes, diarrhea, constipation, nausea, muscle and joint aches, and anemia. Side effect prevention and management were also reviewed including laboratory monitoring.    Administration: I reviewed that this medication can be taken with food, as well as the importance of medication adherence.    Drug Interactions: Instructed the patient that this medication has potential drug interactions. The patient should inform me of any new prescriptions so that I may evaluate for potential drug interactions.other medications reviewed and up to date in Epic.  No drug interactions identified.    Patient verbalized understanding of the above information as well as how to contact the Team with any questions/concerns.    Time with patient: 20 minutes    Ezekiel Ina, PharmD  PGY2 Oncology Pharmacy Resident   Pager ID: [phone]    I was the precepting pharmacist in the delivery of services. I agree with the plan as documented.    Laverna Peace PharmD, BCOP, CPP  Hematology/Oncology Pharmacist  P: [phone]

## 2019-08-06 NOTE — Unmapped (Signed)
Mr.  Stephen Bartlett reports not receiving [employer] delivery on 07/25/19.  He cannot find it in the house and wife does not remember anything about package. Per courier: Package delivered 8:48 am and left at door step/patient was texted.  Resending medication for delivery on 08/07/19.    Horace Porteous, PharmD  [facility]

## 2019-08-07 MED FILL — NUBEQA 300 MG TABLET: 30 days supply | Qty: 120 | Fill #1 | Status: AC

## 2019-08-07 MED FILL — NUBEQA 300 MG TABLET: ORAL | 30 days supply | Qty: 120 | Fill #1

## 2019-08-08 ENCOUNTER — Other Ambulatory Visit: Payer: Self-pay | Admitting: Internal Medicine

## 2019-08-08 DIAGNOSIS — U071 COVID-19: Secondary | ICD-10-CM

## 2019-08-08 DIAGNOSIS — J1282 Pneumonia due to coronavirus disease 2019: Secondary | ICD-10-CM

## 2019-08-13 NOTE — Unmapped (Signed)
Called pt in response to his call to palliative care RN.     He reports significant HA after taking the darolutamide, does not want to continue this drug if causes HA. Last dose last night, did not take today. Has been taking with food.    Message route to ONC provider to follow up.

## 2019-08-14 NOTE — Unmapped (Addendum)
08/14/19 at 4:20 pm: second call, spoke to Stephen Bartlett.  He was very pleasant, but struggles to recall all the details of his care.  He thinks he restarted the daralutamide ~ 1 week ago.  He did not take it yesterday or today because he is having headaches.  We agreed that he can continue to hold this until I can confirm the plan with Dr.Basch.     The [facility] home health nurse made a visit this afternoon. She told him that his BP is a little high, but he cannot recall the numbers. Awaiting a call back from home health nurse with the BP reading.     Lucia Gaskins RN   Per diem NN      08/14/19 at 10:00 am  Attempted to return the call, VM not set up yet. Multiple documentation in Epic  regarding interruptions in telephone access.  - Will continue to try.    Per chart review:  - his darolutamide had been interrupted due to lost drug, was to restart when he received new supply, need to confirm when he restarted it.   - HTN, on amlodpine + hydrochlorothiazide. His HTN may also be causing the headaches.   - Next [facility] appt is 08/20/19 with Palliative team.    Lucia Gaskins RN MSN  Per diem NN for GU Med Onc    Spoke with [name] at [facility] for update.  -  PT was in the home yesterday.    - The new referral to nursing services has been delayed because they have not been able to reach the family per phone to schedule; provided updated # [phone].  - Most recent Bps 12/11 = 130/80 and p 12/15 = 120/80.  - [facility] also completed a MSW eval, which notes discord between [name].     Lucia Gaskins RN MSN

## 2019-08-15 MED ORDER — DEXAMETHASONE 2 MG TABLET
ORAL_TABLET | Freq: Every day | ORAL | 0 refills | 90.00000 days | Status: CP
Start: 2019-08-15 — End: ?

## 2019-08-15 NOTE — Unmapped (Signed)
08/15/19:  Addendum per discussion with Diane RN,  [facility] RN, fax # [phone]. Ph [phone].    Diane completed the initial home visit yesterday. Patient and wife both present and argumentative. Medication review completed.  He exhibits confusion regarding his medications and takes them inconsistently. She will be returning next week to assist with setting up pill boxes.  In the interim, she needs assistance in confirming what his current medication list should be:  - pain management:  motrin prescribed from his discharge; he did not have any oxycodone in his home supply.  - BP 147/90. Amlodipine and hydrochlorothiazide discontinued at discharge ? not taking / not reported.   - Oncology:  He is taking decadron 2 mg tabs once in a while.  He stopped his darolutamide 300 mg tabs due to headache (as reported to me.)  - Hypokalemia: potassium chlor 20 meq ER on hand, taking inconsistently.  - Allopurinol, atorvastatin, famitidine, stool softeners  on hand.     Plan:  [facility] oncology and palliative care notes faxed to Diane.  - Will discuss medication discrepancies with Dr.Basch and update plan on oncology and HTN medications.    Lucia Gaskins RN MSN  Per diem NN    Lucia Gaskins RN  Per diem NN

## 2019-08-15 NOTE — Unmapped (Signed)
Addended by: Lucia Gaskins A on: 08/15/2019 11:32 AM     Modules accepted: Orders

## 2019-08-15 NOTE — Unmapped (Signed)
12/23 @ 11:15 am:    Per Dr.Basch's instructions, called Mr.Deupree with these selected  medication instructions:    - Okay to continue to hold Daralutamide.  He set this aside.   - Restart/ continue Decadron daily; prescribed as part of cancer treatment, but also effective in COVID recovery. Refill sent to local pharmacy per pt request.   - potassium ER take daily for chronic hypokalemia, which was again present during hospitalization.  K + 2.6 on admission and 3.3 at discharge.     Pt also asked for and given confirmation to continue the following medications which are on hand:  Atorvastatin, allopurinol, famotidine.     This note efaxed to [facility] RN Diane.      Lucia Gaskins RN MSN  Per diem NN

## 2019-08-20 NOTE — Unmapped (Signed)
Pt was at [facility] when I called for our phone visit today. Will reschedule today's visit for tomorrow at 9 am.    Burtis Junes, FNP-BC, [facility]  Outpatient Oncology Palliative Care Service  [facility]  [address], [place], [place] [address]  [phone]

## 2019-08-21 ENCOUNTER — Institutional Professional Consult (permissible substitution): Admit: 2019-08-21 | Discharge: 2019-08-22 | Payer: MEDICARE

## 2019-08-21 MED ORDER — OXYCODONE 5 MG TABLET
ORAL_TABLET | Freq: Two times a day (BID) | ORAL | 0 refills | 30.00000 days | Status: CP | PRN
Start: 2019-08-21 — End: ?

## 2019-08-21 NOTE — Unmapped (Signed)
OUTPATIENT ONCOLOGY PALLIATIVE CARE    Principal Diagnosis: [name] is a 78 y.o. 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     Assessment/Plan:   1.  Upper back pain due to bone mets from prostate cancer-controlled with current regimen  -Continue Decadron 2 mg and pepcid 20 mg qd  -Continue with oxycodone 5 mg every 12 hours as needed pain      2. Debility-improving. S/p covid dx with hospitalization from 12/2 to 07/27/19 at his local hospital.  He had acute hypoxic respiratory failure and mild GI involvement.   -Active with home health [facility] and RN support    3.  Safety-NP concerned about medication compliance.  Patient had not been receptive to working with a pillbox in the past.  -Explained benefits of having the pillbox and will connect with the home health RN to help with this set up.  He has been taking the medications just from reading the vials.  Does verbalize that he is concerned that he will not take the medications correctly.  -Patient is receptive to working from the pillbox.  NP will also reach out to patient's son to see if he can help fill the pillbox on a weekly basis.  -NP checked with patient's current medications and he is currently still taking the potassium 20 mEq and no hydrochlorothiazide.  Blood pressure has been running low and he was seen at the [facility] yesterday and is due to get lab work again next week.  -NP spoke with Stephen Bartlett, home health RN, [phone] and together we will try to ensure that patient has a support he needs.  -She is requesting a current med list & prognosis regarding his prostate cancer.  Shared the information I could from the chart review and will fax records [phone]  -NP reviewed medication list with patient.      4. BLE edema-infrequent pedal edema and patient is no longer on hydrochlorothiazide.  -Patient is taking the potassium and his potassium has been low.  Patient reports that the [facility] wants him to continue the potassium.  -Shares that he is scheduled to get labs drawn from the [employer] [facility] clinic next week.      5.  Advanced care planning-not addressed today  -At prior visits: NP did discuss that in the state of [place], his wife will be his agent unless he does get this in writing.  -Patient wants his son to be his primary agent, but he does not have advanced care planning documents.  His secondary agent would be his spouse.  -Encouraged patient to reach out to his son, who is a Clinical research associate for this documentation.  -Patient does not have a cell phone or computer at home, so it is difficult to share information.        # Controlled substances risk management.   - Patient does not have a signed pain medication agreement with our team, but this was discussed verbally.   - [place]CCS[place] database was reviewed today and it was appropriate.   - Urine drug screen was not performed at this visit. Findings: not applicable.   - Patient has received information about safe storage and administration of medications.   - Patient has not received a prescription for narcan.      F/u: We will try to schedule I/21 with either Dr. Legrand Bartlett or Dr. Genice Bartlett.  They will be in clinic on the day the patient is being seen by Dr. Vernell Bartlett. Pt  is receptive to that as well.  He does not have Recruitment consultant.    ----------------------------------------  Referring Provider: Dr. Vernell Bartlett  Oncology Team: Dr. 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  In January 2019, patient had cord compression and subsequent surgery & XRT with a rod from C7-T8.  His prior tx have been Radium 223, Avodart/Casodex and abiraterone/Lupron. Restaging scans 3/20 (post-Redium) are stable/slightly improved. He is also being followed for a neuroendocrine tumor grade 1 pancreatic mass.     Patient's primary symptom is his upper back pain which is exacerbated by changes in the weather.  He reports that the pain is currently at 7/10, but he has not taken the a.m. ibuprofen 600 mg and the Decadron 2 mg.  He does find some relief with this.  Has some difficulty using the pain scores.  Feels that he still not getting adequate amount of relief.  He describes the pain as a numbing and aching sensation.  He had tried Tylenol in the past with no relief. Does endorse history of marijuana use.  No other illicit drug use and opioid risk assessment tool was done today.    Patient shared that when he had his spine surgery he could not walk and it took much time and energy for him to regain his strength and walk again. He still drives and is able to do some cooking in the home.  He is independent with his personal ADLs.    ??  Current cancer-directed therapy: ADT- Eligard 45 (6 month injection)  and  Darolutamide is on hold        Interval hx, 08/21/19-CK    Patient was hospitalized from 12/2-12/4 for complications from Covid.  He had acute hypoxic respiratory failure with mild diarrhea.  He was sent home with home oxygen and steroids.  Shares that he is hardly using the oxygen now.    Symptom Review:  General: Doing much better  Pain: no change to back pain, worse with damp weather +  Fatigue: Improving  Mobility: Still not as strong as he would like to be with his mobility and is working with the physical therapist  Sleep: No problems  Appetite: good-eating many vegetables.  Nausea: Denies  Bowel function:overall good, denies constipation or diarrhea but does have as needed MiraLAX and needs this  Dyspnea: no cough.  Breathing is improving  Mood: Mood remains positive.    Palliative Performance Scale: 80% - Ambulation: Full / Normal Activity with effort, some evidence of disease / Self-Care:Full / Intake: Normal or reduced / Level of Conscious: Full      Coping/Support Issues: Has the support of his spiritual practice.  He had been attending the [employer] on a regular basis prior to  COVID-19.  His most supportive people are his son, who is a Clinical research associate in [place] and his sister who is a Runner, broadcasting/film/video in [place].  He also has a sister who is a retired Charity fundraiser that lives about 1 hour away.  He does live with his wife since 39.  It is a second marriage for him.    Goals of Care: To feel better    Social History:   Name of primary support: Son, wife and sister  Occupation: Retired.  He had worked in the [employer]  Hobbies: Enjoys visiting family and sitting on the front porch.  Current residence / distance from [facility]:  lives in [place]    Advance Care Planning:   HCPOA:  Natural surrogate decision maker: His son would be his primary agent and his wife would be the secondary  Living Will: No  ACP note:   CODE STATUS: Full    Objective     Opioid Risk Tool:    Male  Male    Family history of substance abuse      Alcohol  1  3    Illegal drugs  2  3    Rx drugs  4  4    Personal history of substance abuse      Alcohol  3  3    Illegal drugs  4  4    Rx drugs  5  5    Age between 13???45 years  1  1    History of preadolescent sexual abuse  3  0    Psychological disease      ADD, OCD, bipolar, schizophrenia  2  2    Depression  1  1       Total: 0  (<3 low risk, 4-7 moderate risk, >8 high risk)    Oncology History   Malignant neoplasm of prostate (CMS-HCC)   09/30/2011 Initial Diagnosis    Malignant neoplasm of prostate (CMS-HCC)     11/23/2018 Endocrine/Hormone Therapy    OP LEUPROLIDE (ELIGARD) 45 MG EVERY 6 MONTHS  Plan Provider: Chrisandra Netters, MD     Bone metastases (CMS-HCC)   08/24/2017 Initial Diagnosis    Bone metastases (CMS-HCC)     11/23/2018 Endocrine/Hormone Therapy    OP LEUPROLIDE (ELIGARD) 45 MG EVERY 6 MONTHS  Plan Provider: Chrisandra Netters, MD         Patient Active Problem List   Diagnosis   ??? Malignant neoplasm of prostate (CMS-HCC)   ??? 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 Hypertension   ??? Calculus of kidney   ??? Acute renal failure (CMS-HCC)   ??? Foreign body in bladder and urethra   ??? Uric acid nephrolithiasis   ??? Renal cyst, acquired, left   ??? Pancreatic mass   ??? Acute pancreatitis   ??? Bronchiectasis (CMS-HCC)   ??? Acute kidney injury (CMS-HCC)   ??? Lesion of pancreas   ??? Bone metastases (CMS-HCC)       Past Medical History:   Diagnosis Date   ??? Calculus of kidney    ??? Hypertension    ??? Malignant neoplasm of prostate (CMS-HCC)        Past Surgical History:   Procedure Laterality Date   ??? PR ARTHRODESIS POSTERIOR/POSTERIORLATERAL CERVICAL BELOW C2 Midline 08/05/2017    Procedure: ARTHRODESIS, POSTERIOR OR POSTEROLATERAL TECHNIQUE, SINGLE LEVEL; CERVICAL BELOW C2 SEGMENT;  Surgeon: Timothy Lasso, MD;  Location: MAIN OR [facility];  Service: Ortho Spine   ??? PR ARTHRODESIS POSTERIOR/POSTEROLATERAL EA ADDL Midline 08/05/2017    Procedure: ARTHRODESIS, POSTERIOR OR POSTEROLATERAL TECHNIQUE, SINGLE LEVEL; EACH ADDITIONAL VERTEBRAL SEGMENT;  Surgeon: Timothy Lasso, MD;  Location: MAIN OR [facility];  Service: Ortho Spine   ??? PR LAMINEC/FACETECT/FORAMIN,CERVICAL 1 SEG Midline 08/05/2017    Procedure: LAMINECTOMY SNGL VERTEBRAL SEGMT-UNI/BIL; CERV;  Surgeon: Timothy Lasso, MD;  Location: MAIN OR [facility];  Service: Ortho Spine   ??? PR LAMINEC/FACETECT/FORAMIN,EACH ADDNL Midline 08/05/2017    Procedure: LAMINECTMY 1 SEGMT-UNI/BIL; EA ADD CERV/THOR/LUM;  Surgeon: Timothy Lasso, MD;  Location: MAIN OR [facility];  Service: Ortho Spine   ??? PR POSTERIOR SEGMENTAL INSTRUMENTATION 7-12 VRT  SEG Midline 08/05/2017    Procedure: POSTERIOR SEGMENTAL INSTRUMENTATION; (EG, PEDICLE FIXATION, DUAL RODS W/MULT HOOKS/WIRES) 7-12 VERTEB SEGMT;  Surgeon: Timothy Lasso, MD;  Location: MAIN OR [facility];  Service: Ortho Spine   ??? PR UPGI ENDOSCOPY,FN NEEDLE BX,GUIDED N/A 02/11/2017    Procedure: UGI W/TRANSENDOSCOPIC US-GUIDE INTRA/TRANSMURAL NEEDLE ASP/BX (INCL EXAM ESOPHAGUS, STOMACH, DOUDENUM/JEJ);  Surgeon: Vonda Antigua, MD;  Location: [facility];  Service: Gastroenterology   ??? URETEROSCOPY         Current Outpatient Medications Medication Sig Dispense Refill   ??? allopurinoL (ZYLOPRIM) 100 MG tablet Take 1 tablet (100 mg total) by mouth daily. 90 tablet 3   ??? amLODIPine (NORVASC) 5 MG tablet Take 5 mg by mouth daily.      ??? atorvastatin (LIPITOR) 10 MG tablet Take 10 mg by mouth nightly.      ??? darolutamide (NUBEQA) 300 mg tablet Take 2 tablets (600 mg total) by mouth 2 (two) times a day with meals. Take with food. Swallow tablets whole. 120 tablet 11   ??? dexAMETHasone (DECADRON) 2 MG tablet Take 1 tablet (2 mg total) by mouth daily. 90 tablet 0   ??? famotidine (PEPCID) 20 MG tablet TAKE 1 TABLET(20 MG) BY MOUTH DAILY 90 tablet 3   ??? hydroCHLOROthiazide (MICROZIDE) 12.5 mg capsule Take 12.5 mg by mouth daily.     ??? oxyCODONE (ROXICODONE) 5 MG immediate release tablet Take 1 tablet (5 mg total) by mouth every twelve (12) hours as needed for pain. 60 tablet 0   ??? potassium chloride 20 mEq TbER Take 20 mEq by mouth daily.       No current facility-administered medications for this visit.        Allergies: No Known Allergies    Family History:  Cancer-related family history includes Prostate cancer in an other family member. There is no history of Kidney cancer.  He indicated that the status of his mother is unknown. He indicated that the status of his neg hx is unknown. He indicated that the status of his other is unknown.        Lab Results   Component Value Date    CREATININE 0.91 06/28/2019     Lab Results   Component Value Date    ALKPHOS 66 06/28/2019    BILITOT 0.6 06/28/2019    PROT 6.8 06/28/2019    ALBUMIN 3.9 06/28/2019    ALT 22 06/28/2019    AST 23 06/28/2019       Pam Drown, FNP-BC, [facility]  Outpatient Oncology Palliative Care Service  [facility]  [address], [place], [place] [address]  [phone]      I spent 23 minutes on the phone with the patient. I spent an additional 20 minutes on pre- and post-visit activities.     The patient was physically located in [place] or a state in which I am permitted to provide care. The patient and/or parent/gauardian understood that s/he may incur co-pays and cost sharing, and agreed to the telemedicine visit. The visit was completed via phone and/or video, which was appropriate and reasonable under the circumstances given the patient's presentation at the time.    The patient and/or parent/guardian has been advised of the potential risks and limitations of this mode of treatment (including, but not limited to, the absence of in-person examination) and has agreed to be treated using telemedicine. The patient's/patient's family's questions regarding telemedicine have been answered.     If the phone/video  visit was completed in an ambulatory setting, the patient and/or parent/guardian has also been advised to contact their provider???s office for worsening conditions, and seek emergency medical treatment and/or call 911 if the patient deems either necessary.

## 2019-08-27 ENCOUNTER — Other Ambulatory Visit: Payer: Self-pay

## 2019-08-30 NOTE — Unmapped (Signed)
Darolutamide on hold.  Rescheduling clinical.    Horace Porteous, PharmD  [facility]

## 2019-09-01 DIAGNOSIS — N200 Calculus of kidney: Principal | ICD-10-CM

## 2019-09-12 NOTE — Unmapped (Signed)
Medical Oncology: Prostate Cancer     Assessment:  Castration resistant metastatic prostate cancer to bone, s/p treatment with abiraterone/Lupron (experienced cord compression and discontinued 1/19, and underwent neurosurgery), and radium-223 (6/19 - 11/19).  Also has neuroendocrine tumor grade 1 pancreatic mass.  Restaging scans 06/2019 and 10/2018 were stable post-Radium.  PSA was 31 in 08/2018 to 60 in 05/2019, then 51.5 on 06/28/2019 and 45.9 on 09/13/19 with castrate testosterone levels.  He started darolutamide but then this was held during a hospitalization for COVID-19 in late 2020.      He has had challenges with organizing medications at home, and his post-hospitalization home aide is done.  He lives with his wife who works and is not able to help with this, and his son lives in [place] but is becoming more involved.    Today I and Katina Degree PharmD reviewed medications with him, and a plan for organizing his meds.  I restarted his darolutamide and added back a blood pressure medication (Coreg).  He will monitor his BP at home and check in with Katina Degree Pharmd.    He is followed by Palliative Care for back pain in his back at the area of prior surgery where he has a rod.  He is on Decadron and Oxycodone.  He has few treatment options, and he understands the goals of care are supportive and palliative.      Plan:  - Continue ADT; Eligard every 110-months (09/13/2019).  - Restart darolutamide, was on hold after hospitalization.  - Start Coreg 5.25 BID for blood pressure control.  - Followed by Palliative Care for pain control, thank you.  - Prior increased frequency of urination:  Better since stopping tamsulsoin. PVR has been low.  - Prior LEE and ascites, improved with diuretic form PCP.  - Pain: Steroids have helped, ibuprofen has not. Continue dexamethasone.  - HTN  - Follow PSA.  - LEE: Diuretic as needed with his PCP and Palliative Care.    - Advanced care planning: in general has been done with Palliative Care.    I spend 30 minutes with him and 25 minutes working on figuring out medications, connecting with Pharmacy and navigation about his coordination of care.    Follow up virtually in 2 weeks with Katina Degree PharmD about medications and blood pressure.  Follow up with me virtually in 2 months with labs prior.    ---------------------------------------------------------    HPI:  - Prior patient of Dr. Verl Bangs  - 2013: Presented with metastatic prostate cancer to bone, PSA ~900, started Lupron, then Avodart/Casodex, then abiraterone/Lupron  - 1/19: Experienced cord compression while on Lupron/abiraterone --> RT  - Neuroendocrine tumor grade 1 pancreatic mass will be observed.  - 08/11/17: Bone scan: c/w 5/18, stable metastatic disease in the thoracic spine and right hemipelvis.  Redemonstration of radiotracer uptake in the upper thoracic vertebral body. Slightly decreased uptake within the right hemipelvis, consistent with previously seen mixed sclerotic/lytic lesions. No new areas of radiotracer uptake. Physiologic uptake is seen in the kidneys and bladder.  - 09/08/17: CT A/P: 1. Interval improvement in previously visualized pancreatic tail stranding and free fluid with lobulated intermediate density peripancreatic collection along the pancreatic tail, closely abutting the anterior aspect of the spleen. Findings may represent complex pseudocyst in the setting of prior pancreatitis. MRI MRCP is recommended for further evaluation as known neuroendocrine pancreatic tumor is not well delineated on this portal venous phase CT and to exclude postcontrast enhancement of the suspected complex  pancreatic pseudocyst along the tail.  2. Right lower lobe bronchiectasis with mucous plugging.  3. Hepatic hypodensities, unchanged and favored to represent hepatic cysts. This may be also be confirmed by MRI.  2/19: Discontinued abiraterone  6-11/19: Radium-223  11/02/18: CT A/P: No new lytic or blastic osseous lesions. Similar appearance of mixed sclerotic and lytic lesion in the right hemipelvis.  11/02/18: Bone Scan: Slightly decrease in radiotracer uptake involving the thoracic spine and right hemipelvis when compared to prior imaging.  05/31/19: PSA 60.4  06/25/19: CT A/P: Similar appearance of mixed sclerotic and lytic lesions in the right hemipelvis. No new lytic or blastic lesions.  Similar appearance of hyperenhancing pancreatic body mass, compatible with pancreatic neuroendocrine tumor.  Decreased size of fluid collection adjacent to the pancreatic tail/inferior margin of the spleen.  Bronchiectasis with mucous plugging in the right lower lobe, similar to prior.  06/25/19: Similar focal increase in radiotracer uptake within the right hemipelvis when compared to prior bone scan from 11/02/2018. Stable focal uptake in the upper thoracic vertebral bodies.    ROS: Feeling well today. Pain is stable, no new pain. Urinary frequency better since stopping tamsulosin.    Current Outpatient Medications on File Prior to Visit   Medication Sig Dispense Refill   ??? allopurinoL (ZYLOPRIM) 100 MG tablet Take 1 tablet (100 mg total) by mouth daily. 90 tablet 3   ??? atorvastatin (LIPITOR) 10 MG tablet Take 10 mg by mouth nightly.      ??? darolutamide (NUBEQA) 300 mg tablet Take 2 tablets (600 mg total) by mouth 2 (two) times a day with meals. Take with food. Swallow tablets whole. (Patient not taking: Reported on 08/21/2019) 120 tablet 11   ??? dexAMETHasone (DECADRON) 2 MG tablet Take 1 tablet (2 mg total) by mouth daily. 90 tablet 0   ??? docusate sodium (COLACE) 100 MG capsule Take 100 mg by mouth two (2) times a day as needed for constipation.     ??? famotidine (PEPCID) 20 MG tablet TAKE 1 TABLET(20 MG) BY MOUTH DAILY 90 tablet 3   ??? oxyCODONE (ROXICODONE) 5 MG immediate release tablet Take 1 tablet (5 mg total) by mouth every twelve (12) hours as needed for pain. 60 tablet 0   ??? polyethylene glycol (MIRALAX) 17 gram packet Take 17 g by mouth two (2) times a day as needed.     ??? potassium chloride 20 mEq TbER Take 20 mEq by mouth daily.       No current facility-administered medications on file prior to visit.        PE:  BP 150/90  - Pulse 92  - Temp 36.8 ??C (98.2 ??F) (Temporal)  - Resp 16  - Ht 167.6 cm (5' 5.98)  - Wt 78.4 kg (172 lb 14.4 oz)  - SpO2 94%  - BMI 27.92 kg/m??   NAD  A+Ox3  No rash  Abd ND    Data:    PSA   Date Value Ref Range Status   06/28/2019 51.50 (H) 0.00 - 4.00 ng/mL Final   05/31/2019 60.40 (H) 0.00 - 4.00 ng/mL Final   08/31/2018 31.30 (H) 0.00 - 4.00 ng/mL Final   07/06/2018 35.00 (H) 0.00 - 4.00 ng/mL Final   06/01/2018 33.10 (H) 0.00 - 4.00 ng/mL Final   05/04/2018 42.00 (H) 0.00 - 4.00 ng/mL Final   03/30/2018 42.70 (H) 0.00 - 4.00 ng/mL Final   01/26/2018 46.20 (H) 0.00 - 4.00 ng/mL Final   08/25/2017 61.50 (H) 0.00 -  4.00 ng/mL Final   08/08/2017 80.90 (H) 0.00 - 4.00 ng/mL Final   05/19/2017 34.70 (H) 0.00 - 4.00 ng/mL Final   02/10/2017 30.50 (H) 0.00 - 4.00 ng/mL Final   01/20/2017 19.60 (H) 0.00 - 4.00 ng/mL Final   01/07/2017 18.70 (H) 0.00 - 4.00 ng/mL Final   12/22/2016 19.40 (H) 0.00 - 4.00 ng/mL Final   12/03/2016 31.70 (H) 0.00 - 4.00 ng/mL Final   11/04/2016 30.30 (H) 0.00 - 4.00 ng/mL Final   08/06/2016 18.80 (H) 0.00 - 4.00 ng/mL Final   05/06/2016 24.10 (H) 0.00 - 4.00 ng/mL Final   01/29/2016 12.50 (H) 0.00 - 4.00 ng/mL Final   10/30/2015 11.10 (H) 0.00 - 4.00 ng/mL Final   07/24/2015 7.41 (H) 0.00 - 4.00 ng/mL Final   04/03/2015 3.44 0.00 - 4.00 ng/mL Final   12/23/2014 12.50 (H) 0.00 - 4.00 ng/mL Final

## 2019-09-13 ENCOUNTER — Ambulatory Visit: Admit: 2019-09-13 | Discharge: 2019-09-13 | Payer: MEDICARE | Attending: Medical Oncology | Primary: Medical Oncology

## 2019-09-13 ENCOUNTER — Other Ambulatory Visit: Admit: 2019-09-13 | Discharge: 2019-09-13 | Payer: MEDICARE

## 2019-09-13 ENCOUNTER — Institutional Professional Consult (permissible substitution): Admit: 2019-09-13 | Discharge: 2019-09-13 | Payer: MEDICARE

## 2019-09-13 DIAGNOSIS — C61 Malignant neoplasm of prostate: Principal | ICD-10-CM

## 2019-09-13 DIAGNOSIS — G893 Neoplasm related pain (acute) (chronic): Principal | ICD-10-CM

## 2019-09-13 DIAGNOSIS — C7951 Secondary malignant neoplasm of bone: Principal | ICD-10-CM

## 2019-09-13 LAB — COMPREHENSIVE METABOLIC PANEL
ALBUMIN: 3.8 g/dL (ref 3.5–5.0)
ALKALINE PHOSPHATASE: 74 U/L (ref 38–126)
ALT (SGPT): 21 U/L (ref <50)
ANION GAP: 4 mmol/L — ABNORMAL LOW (ref 7–15)
BILIRUBIN TOTAL: 0.6 mg/dL (ref 0.0–1.2)
BLOOD UREA NITROGEN: 20 mg/dL (ref 7–21)
BUN / CREAT RATIO: 24
CALCIUM: 8.8 mg/dL (ref 8.5–10.2)
CO2: 34 mmol/L — ABNORMAL HIGH (ref 22.0–30.0)
CREATININE: 0.85 mg/dL (ref 0.70–1.30)
EGFR CKD-EPI AA MALE: >90 mL/min/{1.73_m2} (ref >=60)
EGFR CKD-EPI NON-AA MALE: 84 mL/min/{1.73_m2} (ref >=60)
GLUCOSE RANDOM: 98 mg/dL (ref 70–179)
POTASSIUM: 3.1 mmol/L — ABNORMAL LOW (ref 3.5–5.0)
SODIUM: 137 mmol/L (ref 135–145)

## 2019-09-13 LAB — CBC W/ AUTO DIFF
BASOPHILS ABSOLUTE COUNT: 0 10*9/L (ref 0.0–0.1)
BASOPHILS RELATIVE PERCENT: 0.4 %
EOSINOPHILS ABSOLUTE COUNT: 0.1 10*9/L (ref 0.0–0.4)
EOSINOPHILS RELATIVE PERCENT: 0.5 %
LARGE UNSTAINED CELLS: 1 % (ref 0–4)
LYMPHOCYTES ABSOLUTE COUNT: 1.1 10*9/L — ABNORMAL LOW (ref 1.5–5.0)
LYMPHOCYTES RELATIVE PERCENT: 12.5 %
MEAN CORPUSCULAR HEMOGLOBIN CONC: 31.8 g/dL (ref 31.0–37.0)
MEAN CORPUSCULAR HEMOGLOBIN: 29.9 pg (ref 26.0–34.0)
MEAN CORPUSCULAR VOLUME: 94.2 fL (ref 80.0–100.0)
MEAN PLATELET VOLUME: 7.7 fL (ref 7.0–10.0)
MONOCYTES ABSOLUTE COUNT: 0.6 10*9/L (ref 0.2–0.8)
MONOCYTES RELATIVE PERCENT: 6.6 %
NEUTROPHILS ABSOLUTE COUNT: 6.9 10*9/L (ref 2.0–7.5)
NEUTROPHILS RELATIVE PERCENT: 78.6 %
RED BLOOD CELL COUNT: 3.99 10*12/L — ABNORMAL LOW (ref 4.50–5.90)
RED CELL DISTRIBUTION WIDTH: 17.1 % — ABNORMAL HIGH (ref 12.0–15.0)

## 2019-09-13 LAB — PROSTATE SPECIFIC ANTIGEN: Prostate specific Ag:MCnc:Pt:Ser/Plas:Qn:: 45.9 — ABNORMAL HIGH

## 2019-09-13 LAB — POTASSIUM: Potassium:SCnc:Pt:Ser/Plas:Qn:: 3.1 — ABNORMAL LOW

## 2019-09-13 LAB — NEUTROPHILS ABSOLUTE COUNT: Neutrophils:NCnc:Pt:Bld:Qn:Automated count: 6.9

## 2019-09-13 LAB — TESTOSTERONE TOTAL: Testosterone:MCnc:Pt:Ser/Plas:Qn:: <5 — ABNORMAL LOW

## 2019-09-13 MED ORDER — OXYCODONE 5 MG TABLET
ORAL_TABLET | Freq: Two times a day (BID) | ORAL | 0 refills | 30 days | Status: CP | PRN
Start: 2019-09-13 — End: ?

## 2019-09-13 MED ORDER — POTASSIUM CHLORIDE ER 20 MEQ TABLET,EXTENDED RELEASE
ORAL_TABLET | Freq: Every day | ORAL | 11 refills | 0.00000 days | Status: CP
Start: 2019-09-13 — End: ?

## 2019-09-13 MED ORDER — CARVEDILOL 6.25 MG TABLET
ORAL_TABLET | Freq: Two times a day (BID) | ORAL | 11 refills | 30 days | Status: CP
Start: 2019-09-13 — End: 2020-09-12

## 2019-09-13 NOTE — Unmapped (Signed)
Pharmacist  Laverna Peace PharmD, BCOP, CPP  Hematology/Oncology Pharmacist  [phone]  [email or web address]

## 2019-09-13 NOTE — Unmapped (Signed)
Labs drawn peripherally by Yvette Cheek CST.

## 2019-09-13 NOTE — Unmapped (Signed)
Pt received Eligard subcutaneous to the left upper Abd and tolerated it well. Gauze and a band aid applied.

## 2019-09-13 NOTE — Unmapped (Signed)
Outpatient Sw Note - Telephone Call     Sw received page from Lucia Gaskins, [place] regarding patient having difficulty with medication management.      Sw called [facility] [phone]) to learn if they have any services available that could assist the patient.  Sw left message asking for a return call.    Sherran Needs, MSW, LCSW

## 2019-09-14 NOTE — Unmapped (Addendum)
Clinical Pharmacist Practitioner: [facility]    Patient Name: Stephen Bartlett  Patient Age: 78 y.o.    I am seeing Stephen Bartlett  today for medication management.     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 ??Also has neuroendocrine tumor grade 1 pancreatic mass.??Restaging scans 06/2019 and??10/2018 have been stable??post-Radium.??His pain is generally well controlled with PRN analgesics, and mainly is in his back at the area of prior surgery where he has a rod, it is worse when it is rainy. ??Despite stable scans, PSA has been rising, from 31 in 08/2018 to 60 in 05/2019. Patient was also recently admitted for COVID on 11/30 and discharged 12/02.     1.mCRPC - patient is to restart Darolutamide 600 mg PO BID, patient will be looking at home for his bottle of drug that was delivered in December. PSA 45.9. All other labs besides K WNL.   - Restart darolutamide 600 mg BID  - Eligard 45 mg administered today (next due on or after 03/13/20)  ??  2. Pain - pain is managed with Oxycodone 5 mg PO BID and dexamethasone  ??  3. HTN - patient has elevated BP today (150/90) he is currently not taking anything for BP per his report. He has taken amlodipine and hydrochlorothiazide in the past. He has some baseline swelling in his ankles as well as hypokalemia on K supplementation.    - Confirm BP cuff at home  - Start carvedilol 6.25 mg BID    4. Hypokalemia- k is 3.1 today, patient states he has been out of his potassium supplements for 1 month   - Sent prescription for potassium today    5. Medication Management- reviewed and updated medication list to best of my ability, son will email with list of medications he has at home. I will prepare a accurate medication list and send back to family.     Follow- up: 2 week f/up for BP and oral therapy monitoring, 40m f/u with Dr. Vernell Bartlett ______________________________________________________________________    Current cancer therapy: Darolutamide 600 mg BID (on hold)    Interim History: [name] presents with his son. They did not bring his medications today. He is having trouble with medication management at home. Wife is unable to provide support to help with pill boxes or drug administration. He reports he has a sister whom he sees regularly who is a retired Engineer, civil (consulting) that lives near him. He confirms per memory that he is taking the medications that are listed in Epic, but him and his son are still unaware of what medications he should and should not be taking. He reports he has been out of potassium supplementation for 1 month. He also says he currently it not taking anything for blood pressure and he has a cuff at home that a home health agency left for him one time but is unsure how to use it.     Medications:  Current Outpatient Medications   Medication Sig Dispense Refill   ??? allopurinoL (ZYLOPRIM) 100 MG tablet Take 1 tablet (100 mg total) by mouth daily. 90 tablet 3   ??? atorvastatin (LIPITOR) 10 MG tablet Take 10 mg by mouth nightly.      ??? darolutamide (NUBEQA) 300 mg tablet Take 2 tablets (600 mg total) by mouth 2 (two) times a day with meals. Take with food. Swallow tablets whole. 120 tablet 11   ??? dexAMETHasone (DECADRON)  2 MG tablet Take 1 tablet (2 mg total) by mouth daily. 90 tablet 0   ??? docusate sodium (COLACE) 100 MG capsule Take 100 mg by mouth two (2) times a day as needed for constipation.     ??? famotidine (PEPCID) 20 MG tablet TAKE 1 TABLET(20 MG) BY MOUTH DAILY 90 tablet 3   ??? oxyCODONE (ROXICODONE) 5 MG immediate release tablet Take 1 tablet (5 mg total) by mouth every twelve (12) hours as needed for pain. 60 tablet 0   ??? polyethylene glycol (MIRALAX) 17 gram packet Take 17 g by mouth two (2) times a day as needed.     ??? potassium chloride 20 mEq TbER Take 20 mEq by mouth daily. 30 tablet 11   ??? carvediloL (COREG) 6.25 MG tablet Take 1 tablet (6.25 mg total) by mouth Two (2) times a day. 60 tablet 11     No current facility-administered medications for this visit.      I spent 30 minutes in direct patient care.    Laverna Peace PharmD, BCOP, CPP  Hematology/Oncology Pharmacist  P: [phone]

## 2019-09-14 NOTE — Unmapped (Signed)
Outpatient Sw Note - Telephone Call     Sw received call back from [place] at [facility] [phone]).  Tammy stated that they do not have any personnel that can assist with medication management.  However, three pharmacies in the patient's area can make bubble packs of his medications.  They are:  [facility] [phone]); [facility] [phone]); and [facility] [phone]).    Patients can also rent a Medication Management MedFinder, which reminds them to take their medications.  This service through [facility] costs $49.00/month [phone]).      Sw notified Lucia Gaskins, Rn of this.    Sherran Needs, MSW, LCSW

## 2019-09-16 NOTE — Unmapped (Addendum)
09/13/19:  Return visit with Dr.Basch and [facility], metastatic prostate cancer. Darolutamide restarted.     POC:  - Pharmacy f/u in 2 weeks, phone visit, on 2/04.  - Onc f/u in 2 months, phone visit, on 3/25.  - Local labs TBA prior to 3/25.    Medical management support services to be discussed by NN:  - Direct family to private services that can assist with home care.   - Check with PCP for community support services.     Salli Real, [facility] ph # [phone].    09/25/19:  Sherron Monday with son Adria Dill.  - PCP Clinic did not have any recommendations for community based support services.  - Per MSW,  [facility] [phone]) does not have any personnel that can assist with medication management.  However, three pharmacies in the patient's area can make bubble packs of his medications. Son will consider this option versus continuing to use pill box.   -  Introduced option of fee based in home services, such as [email or web address].   - Suggested contacting  community churches as another option for occasional assistance.    Attempted to call patient's sister / new EC  Hilbert Corrigan [phone],   identified by Adria Dill as another famly member who may be able to assist the patient with medication.  No answer.       Lucia Gaskins RN   Per diem NN

## 2019-09-20 MED FILL — NUBEQA 300 MG TABLET: 30 days supply | Qty: 120 | Fill #2 | Status: AC

## 2019-09-20 MED FILL — NUBEQA 300 MG TABLET: ORAL | 30 days supply | Qty: 120 | Fill #2

## 2019-09-20 NOTE — Unmapped (Signed)
[facility] Clinical Assessment & Refill Coordination Note    [name], DOB: 01-Jan-1941  Phone: [phone] (home)     All above HIPAA information was verified with patient.     Was a Nurse, learning disability used for this call? No    Specialty Medication(s):   Hematology/Oncology: Nubeqa     Current Outpatient Medications   Medication Sig Dispense Refill   ??? allopurinoL (ZYLOPRIM) 100 MG tablet Take 1 tablet (100 mg total) by mouth daily. 90 tablet 3   ??? atorvastatin (LIPITOR) 10 MG tablet Take 10 mg by mouth nightly.      ??? carvediloL (COREG) 6.25 MG tablet Take 1 tablet (6.25 mg total) by mouth Two (2) times a day. 60 tablet 11   ??? darolutamide (NUBEQA) 300 mg tablet Take 2 tablets (600 mg total) by mouth 2 (two) times a day with meals. Take with food. Swallow tablets whole. 120 tablet 11   ??? dexAMETHasone (DECADRON) 2 MG tablet Take 1 tablet (2 mg total) by mouth daily. 90 tablet 0   ??? docusate sodium (COLACE) 100 MG capsule Take 100 mg by mouth two (2) times a day as needed for constipation.     ??? famotidine (PEPCID) 20 MG tablet TAKE 1 TABLET(20 MG) BY MOUTH DAILY 90 tablet 3   ??? oxyCODONE (ROXICODONE) 5 MG immediate release tablet Take 1 tablet (5 mg total) by mouth every twelve (12) hours as needed for pain. 60 tablet 0   ??? polyethylene glycol (MIRALAX) 17 gram packet Take 17 g by mouth two (2) times a day as needed.     ??? potassium chloride 20 mEq TbER Take 20 mEq by mouth daily. 30 tablet 11     No current facility-administered medications for this visit.         Changes to medications: [name] reports starting the following medications: carvedilol 6.25 mg BID    No Known Allergies    Changes to allergies: No    SPECIALTY MEDICATION ADHERENCE     Nubeqa 300 mg: 0 days of medicine on hand     Medication Adherence    Patient reported X missed doses in the last month: >5  Specialty Medication: Nubeqa 300 mg  Informant: patient  Reasons for non-adherence: patient forgets  Confirmed plan for next specialty medication refill: delivery by pharmacy  Refills needed for supportive medications: not needed          Specialty medication(s) dose(s) confirmed: Regimen is correct and unchanged.     Are there any concerns with adherence? Yes: Pt has hard time keeping up with medications. Lacks support from family due to wife working and busy with her health conditions    Adherence counseling provided? Yes: Keep a separate pill box for Nubeqa.  Morning and evening doses.  Set timer on phone or clock to go off at time for dose.    CLINICAL MANAGEMENT AND INTERVENTION      Clinical Benefit Assessment:    Do you feel the medicine is effective or helping your condition? Patient declined to answer    Clinical Benefit counseling provided? Not needed    Adverse Effects Assessment:    Are you experiencing any side effects? NA - pt has not been taking    Are you experiencing difficulty administering your medicine? He has been advised to make sure and take medication with food - after breakfast for morning dose and after dinner for evening dose    Quality of Life Assessment:  How many days over the past month did your prostate cancer keep you from your normal activities? For example, brushing your teeth or getting up in the morning. Patient declined to answer    Have you discussed this with your provider? Yes    Therapy Appropriateness:    Is therapy appropriate? Yes, therapy is appropriate and should be continued    DISEASE/MEDICATION-SPECIFIC INFORMATION      N/A    PATIENT SPECIFIC NEEDS     ? Does the patient have any physical, cognitive, or cultural barriers? No    ? Is the patient high risk? Yes, patient is taking oral chemotherapy. Appropriateness of therapy as been assessed.     ? Does the patient require a Care Management Plan? No     ? Does the patient require physician intervention or other additional services (i.e. nutrition, smoking cessation, social work)? No      SHIPPING     Specialty Medication(s) to be Shipped:   Hematology/Oncology: Nubeqa    Other medication(s) to be shipped: none     Changes to insurance: No    Delivery Scheduled: Yes, Expected medication delivery date: 09/20/19.     Medication will be delivered via Same Day Courier to the confirmed prescription address in [facility]    The patient will receive a drug information handout for each medication shipped and additional FDA Medication Guides as required.  Verified that patient has previously received a Conservation officer, historic buildings.    All of the patient's questions and concerns have been addressed.    Breck Coons [facility] Specialty Pharmacist

## 2019-09-21 NOTE — Unmapped (Signed)
Contacted patient to inform them of their eligibility to receive covid-19 vaccine per [place] DHHS guidelines.  Patient states that an appt last week he was informed a visit would be scheduled for him in [place]. Checked his appts, none scheduled. Provided him with [facility] scheduling team phone number to make an covid-19 vaccine appt at [facility] [phone], advised of potentially long hold times. Informed him that one of the questions he will be asked at the time of scheduling, is when he was treated for covid-19 in the hospital, if he received an intravenous antibody infusion. Explained he received an anti-viral called remdesivir, and did not receive any antibody treatments per his hospital notes. Patient verbalized understanding. Has no further questions at this time.   Length of phone call: 13 min  Chart review: 7 min

## 2019-09-24 ENCOUNTER — Institutional Professional Consult (permissible substitution): Admit: 2019-09-24 | Discharge: 2019-09-25 | Payer: MEDICARE

## 2019-09-24 DIAGNOSIS — Z515 Encounter for palliative care: Principal | ICD-10-CM

## 2019-09-24 DIAGNOSIS — G893 Neoplasm related pain (acute) (chronic): Principal | ICD-10-CM

## 2019-09-24 DIAGNOSIS — C61 Malignant neoplasm of prostate: Principal | ICD-10-CM

## 2019-09-24 DIAGNOSIS — C7951 Secondary malignant neoplasm of bone: Principal | ICD-10-CM

## 2019-09-24 MED ORDER — OXYCODONE 5 MG TABLET
ORAL_TABLET | Freq: Three times a day (TID) | ORAL | 0 refills | 30.00000 days | Status: CP | PRN
Start: 2019-09-24 — End: ?

## 2019-09-24 NOTE — Unmapped (Signed)
I spoke with patient Stephen Bartlett to confirm appointments on the following date(s): 11/22/19    Stephen Bartlett

## 2019-09-24 NOTE — Unmapped (Signed)
OUTPATIENT ONCOLOGY PALLIATIVE CARE    Principal Diagnosis: [name] is a 79 y.o. 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     Assessment/Plan:   1.  Upper back pain due to bone mets from prostate cancer-controlled with current regimen  -Continue Decadron 2 mg and pepcid 20 mg qd  -Increase oxycodone 5 mg every 12 hours as needed pain To oxycodone 5 mg every 8 hours as needed pain.  Notices that when it is damp outside of needs sometimes 3 tablets/day.      2. Safety-appears safe in his home.  -NP reviewed medication list with patient.  Independent with medication regimen by taking medications directly from medication bottles.  -Discussed getting the Covid vaccine and patient shared that he is in the process of making an appointment at [facility].       3.  Feet edema-mild  -Encourage patient to follow-up with [facility] clinic.  -Shared that it would be good for them to check his blood pressure as well.  Patient shared that his sister is a retired Engineer, civil (consulting) and she is also able to check his blood pressure when he visits her.    4.  Insomnia-isolated  -Shared that shorter naps, less than 20 minutes can help promote nighttime sleep.    5.  Advanced care planning-  -Discussed values and maintaining his independence and his relationship with his son are important to him.  His strengths include his religious faith and is grateful for his family and being able to live independently.      -At prior visits: NP did discuss that in the state of [place], his wife will be his agent unless he does get this in writing.  -Patient wants his son to be his primary agent, but he does not have advanced care planning documents.  His secondary agent would be his spouse.  -Encouraged patient to reach out to his son, who is a Clinical research associate for this documentation.  -Patient does not have a cell phone or computer at home, so it is difficult to share information.        # Controlled substances risk management.   - Patient does not have a signed pain medication agreement with our team, but this was discussed verbally.   - [place]CC[place] database was reviewed today and it was appropriate.   - Urine drug screen was not performed at this visit. Findings: not applicable.   - Patient has received information about safe storage and administration of medications.   - Patient has not received a prescription for narcan.      F/u: 3/1 8:30 phone  ----------------------------------------  Referring Provider: Dr. [name]  Oncology Team: Dr. 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  In January 2019, patient had cord compression and subsequent surgery & XRT with a rod from C7-T8.  His prior tx have been Radium 223, Avodart/Casodex and abiraterone/Lupron. Restaging scans 3/20 (post-Redium) are stable/slightly improved. He is also being followed for a neuroendocrine tumor grade 1 pancreatic mass.     Patient's primary symptom is his upper back pain which is exacerbated by changes in the weather.  He reports that the pain is currently at 7/10, but he has not taken the a.m. ibuprofen 600 mg and the Decadron 2 mg.  He does find some relief with this.  Has some difficulty using the pain scores.  Feels that he still not  getting adequate amount of relief.  He describes the pain as a numbing and aching sensation.  He had tried Tylenol in the past with no relief. Does endorse history of marijuana use.  No other illicit drug use and opioid risk assessment tool was done today.    Patient shared that when he had his spine surgery he could not walk and it took much time and energy for him to regain his strength and walk again. He still drives and is able to do some cooking in the home.  He is independent with his personal ADLs.    ??  Current cancer-directed therapy: ADT- Eligard 45 (6 month injection)  and  Darolutamide         Interval hx, 08/21/19-CK    Patient was hospitalized from 12/2-12/4 for complications from Covid.  He had acute hypoxic respiratory failure with mild diarrhea.  He was sent home with home oxygen and steroids.  Shares that he is hardly using the oxygen now.    Interval hx 09/24/19 CK    Noting some mild bilateral feet edema, worse at at bedtime.  No problems getting his shoes on.  Finds that elevating his legs helps.    Symptom Review:  General: pretty good  Pain: Continues to have back pain, overall stable.  Does report however when it rains outside, that his back pain worsens.  Using oxycodone 5 mg 1-2 times a day, but when it rains sometimes he needs it 3 times a day.  Continues on Decadron 2 mg daily  Fatigue: energy is overall ok.  Independent with ADLs and cooks on his own.  Mobility: Uses a cane when he leaves his home.  He is able to drive.  Sleep: Infrequent nights where he will wake up at 2 or 3 AM and watch TV.  Shares that he does take long naps sometimes.  Appetite: Too good  Shares he feels like he is gaining weight and his stomach.  Nausea: Denies  Bowel function: Usually has a bowel movement daily.  Has as needed Colace and MiraLAX.  Dyspnea: Denies issues.  Mood: Mood remains positive.    Palliative Performance Scale: 80% - Ambulation: Full / Normal Activity with effort, some evidence of disease / Self-Care:Full / Intake: Normal or reduced / Level of Conscious: Full      Coping/Support Issues: Has the support of his spiritual practice.  He had been attending the [employer] on a regular basis prior to  COVID-19.  His most supportive people are his son, who is a Clinical research associate in [place] and his sister who is a Runner, broadcasting/film/video in [place].  He also has a sister who is a retired Charity fundraiser that lives about 1 hour away.  He does live with his wife since 89.  It is a second marriage for him.    Goals of Care: To feel better    Social History:   Name of primary support: Son, wife and sister  Occupation: Retired.  He had worked in the [employer] Hobbies: Enjoys visiting family and sitting on the front porch.  Current residence / distance from [facility]: lives in [place].  45 minutes from [facility].  Shares it is easy to get to the [facility] campus    Advance Care Planning:   HCPOA:  Natural surrogate decision maker: His son would be his primary agent and his wife would be the secondary  Living Will: No  ACP note:   CODE STATUS: Full  Objective     Opioid Risk Tool:    Male  Male    Family history of substance abuse      Alcohol  1  3    Illegal drugs  2  3    Rx drugs  4  4    Personal history of substance abuse      Alcohol  3  3    Illegal drugs  4  4    Rx drugs  5  5    Age between 41???45 years  1  1    History of preadolescent sexual abuse  3  0    Psychological disease      ADD, OCD, bipolar, schizophrenia  2  2    Depression  1  1       Total: 0  (<3 low risk, 4-7 moderate risk, >8 high risk)    Oncology History   Malignant neoplasm of prostate (CMS-HCC)   09/30/2011 Initial Diagnosis    Malignant neoplasm of prostate (CMS-HCC)     11/23/2018 Endocrine/Hormone Therapy    OP LEUPROLIDE (ELIGARD) 45 MG EVERY 6 MONTHS  Plan Provider: Chrisandra Netters, MD     Bone metastases (CMS-HCC)   08/24/2017 Initial Diagnosis    Bone metastases (CMS-HCC)     11/23/2018 Endocrine/Hormone Therapy    OP LEUPROLIDE (ELIGARD) 45 MG EVERY 6 MONTHS  Plan Provider: Chrisandra Netters, MD         Patient Active Problem List   Diagnosis   ??? Malignant neoplasm of prostate (CMS-HCC)   ??? 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 Hypertension   ??? Calculus of kidney   ??? Acute renal failure (CMS-HCC)   ??? Foreign body in bladder and urethra   ??? Uric acid nephrolithiasis   ??? Renal cyst, acquired, left   ??? Pancreatic mass   ??? Acute pancreatitis   ??? Bronchiectasis (CMS-HCC)   ??? Acute kidney injury (CMS-HCC)   ??? Lesion of pancreas   ??? Bone metastases (CMS-HCC)       Past Medical History:   Diagnosis Date   ??? Calculus of kidney    ??? Hypertension    ??? Malignant neoplasm of prostate (CMS-HCC) Past Surgical History:   Procedure Laterality Date   ??? PR ARTHRODESIS POSTERIOR/POSTERIORLATERAL CERVICAL BELOW C2 Midline 08/05/2017    Procedure: ARTHRODESIS, POSTERIOR OR POSTEROLATERAL TECHNIQUE, SINGLE LEVEL; CERVICAL BELOW C2 SEGMENT;  Surgeon: Timothy Lasso, MD;  Location: MAIN OR [facility];  Service: Ortho Spine   ??? PR ARTHRODESIS POSTERIOR/POSTEROLATERAL EA ADDL Midline 08/05/2017    Procedure: ARTHRODESIS, POSTERIOR OR POSTEROLATERAL TECHNIQUE, SINGLE LEVEL; EACH ADDITIONAL VERTEBRAL SEGMENT;  Surgeon: Timothy Lasso, MD;  Location: MAIN OR [facility];  Service: Ortho Spine   ??? PR LAMINEC/FACETECT/FORAMIN,CERVICAL 1 SEG Midline 08/05/2017    Procedure: LAMINECTOMY SNGL VERTEBRAL SEGMT-UNI/BIL; CERV;  Surgeon: Timothy Lasso, MD;  Location: MAIN OR [facility];  Service: Ortho Spine   ??? PR LAMINEC/FACETECT/FORAMIN,EACH ADDNL Midline 08/05/2017    Procedure: LAMINECTMY 1 SEGMT-UNI/BIL; EA ADD CERV/THOR/LUM;  Surgeon: Timothy Lasso, MD;  Location: MAIN OR [facility];  Service: Ortho Spine   ??? PR POSTERIOR SEGMENTAL INSTRUMENTATION 7-12 VRT SEG Midline 08/05/2017    Procedure: POSTERIOR SEGMENTAL INSTRUMENTATION; (EG, PEDICLE FIXATION, DUAL RODS W/MULT HOOKS/WIRES) 7-12 VERTEB SEGMT;  Surgeon: Timothy Lasso, MD;  Location: MAIN OR [facility];  Service: Ortho Spine   ??? PR UPGI ENDOSCOPY,FN NEEDLE BX,GUIDED N/A 02/11/2017    Procedure: UGI W/TRANSENDOSCOPIC US-GUIDE INTRA/TRANSMURAL  NEEDLE ASP/BX (INCL EXAM ESOPHAGUS, STOMACH, DOUDENUM/JEJ);  Surgeon: Vonda Antigua, MD;  Location: [facility];  Service: Gastroenterology   ??? URETEROSCOPY         Current Outpatient Medications   Medication Sig Dispense Refill   ??? allopurinoL (ZYLOPRIM) 100 MG tablet Take 1 tablet (100 mg total) by mouth daily. 90 tablet 3   ??? atorvastatin (LIPITOR) 10 MG tablet Take 10 mg by mouth nightly.      ??? carvediloL (COREG) 6.25 MG tablet Take 1 tablet (6.25 mg total) by mouth Two (2) times a day. 60 tablet 11 ??? darolutamide (NUBEQA) 300 mg tablet Take 2 tablets (600 mg total) by mouth 2 (two) times a day with meals. Take with food. Swallow tablets whole. 120 tablet 11   ??? dexAMETHasone (DECADRON) 2 MG tablet Take 1 tablet (2 mg total) by mouth daily. 90 tablet 0   ??? docusate sodium (COLACE) 100 MG capsule Take 100 mg by mouth two (2) times a day as needed for constipation.     ??? famotidine (PEPCID) 20 MG tablet TAKE 1 TABLET(20 MG) BY MOUTH DAILY 90 tablet 3   ??? oxyCODONE (ROXICODONE) 5 MG immediate release tablet Take 1 tablet (5 mg total) by mouth every twelve (12) hours as needed for pain. 60 tablet 0   ??? polyethylene glycol (MIRALAX) 17 gram packet Take 17 g by mouth two (2) times a day as needed.     ??? potassium chloride 20 mEq TbER Take 20 mEq by mouth daily. 30 tablet 11     No current facility-administered medications for this visit.        Allergies: No Known Allergies    Family History:  Cancer-related family history includes Prostate cancer in an other family member. There is no history of Kidney cancer.  He indicated that the status of his mother is unknown. He indicated that the status of his neg hx is unknown. He indicated that the status of his other is unknown.        Lab Results   Component Value Date    CREATININE 0.85 09/13/2019     Lab Results   Component Value Date    ALKPHOS 74 09/13/2019    BILITOT 0.6 09/13/2019    PROT 7.0 09/13/2019    ALBUMIN 3.8 09/13/2019    ALT 21 09/13/2019    AST 26 09/13/2019       Pam Drown, FNP-BC, [facility]  Outpatient Oncology Palliative Care Service  [facility]  [address] [place], [place] [address]  [phone]      I spent 23 minutes on the phone with the patient. I spent an additional 15 minutes on pre- and post-visit activities.     The patient was physically located in [place] or a state in which I am permitted to provide care. The patient and/or parent/gauardian understood that s/he may incur co-pays and cost sharing, and agreed to the telemedicine visit. The visit was completed via phone and/or video, which was appropriate and reasonable under the circumstances given the patient's presentation at the time.    The patient and/or parent/guardian has been advised of the potential risks and limitations of this mode of treatment (including, but not limited to, the absence of in-person examination) and has agreed to be treated using telemedicine. The patient's/patient's family's questions regarding telemedicine have been answered.     If the phone/video visit was completed in an ambulatory setting, the patient and/or parent/guardian has also  been advised to contact their provider???s office for worsening conditions, and seek emergency medical treatment and/or call 911 if the patient deems either necessary.

## 2019-09-25 NOTE — Unmapped (Addendum)
Telephone call with patient's son, Adria Dill.     - No blood pressure cuff in the home.  Recommended [employer] brand for purchasing on line.    - Son stated that the pt's sister Hilbert Corrigan ph # [phone] may be able to assist the pt with medications / refilling pill box.  My call to [facility] was not answered, will  Follow-up.    - Son had taken photos of all the medications in the home as of 1/21. Reconciliation per phone call:    On Hand  - Oxycodone  5 mg, PO q 12 as needed (correct)    - famotidine go mg, PO daily (correct)    -  allopurinal 100 mg, PO daily (correct)    - atorvastatin 10 mg, PO nightly (correct)    - dexamethasone 2 mg, PO daily (correct)    - Potassium chloride 20 mg, PO daily (correct)    - docusate sodium, 100 mg PRN (correct)    On hand, but not on current Epic medication list:    - ibuprofen 600 mg tab, 1 tab q 8 hours prn pain    Not on hand, but on current Epic medication list:    -   Carvedilol 6.25 mg.   New Rx sent to [facility] on 1/21. Confirmed per phone call, refill picked up on 1/22.       -   darolutamide 300 mg. Comes from [facility] pharm.  Dispensed 09/20/19.    Lucia Gaskins RN   Per diem NN

## 2019-09-27 ENCOUNTER — Institutional Professional Consult (permissible substitution)
Admit: 2019-09-27 | Discharge: 2019-09-28 | Payer: MEDICARE | Attending: Pharmacist Clinician (PhC)/ Clinical Pharmacy Specialist | Primary: Pharmacist Clinician (PhC)/ Clinical Pharmacy Specialist

## 2019-09-27 NOTE — Unmapped (Signed)
Clinical Pharmacist Practitioner: [facility]    Patient Name: Stephen Bartlett  Patient Age: 79 y.o.    I am seeing Stephen Bartlett  today for medication management.     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 ??Also has neuroendocrine tumor grade 1 pancreatic mass.??Restaging scans 06/2019 and??10/2018 have been stable??post-Radium.??His pain is generally well controlled with PRN analgesics, and mainly is in his back at the area of prior surgery where he has a rod, it is worse when it is rainy. ??Despite stable scans, PSA has been rising, from 31 in 08/2018 to 60 in 05/2019. Patient was also recently admitted for COVID on 11/30 and discharged 12/02.     1. mCRPC - patient has restarted Darolutamide 600 mg PO BID, currently taking as prescribed PSA 45.9. All other labs besides K WNL. PSA from 09/13/19 = 45.9. Will  - Continue darolutamide 600 mg BID  - Eligard 45 mg due on or after 03/13/20  ??  2. Pain - pain is managed with Oxycodone 5 mg PO BID and dexamethasone  - Managed by palliative care  ??  3. HTN - patient will be going to sisters house tomorrow who has a BP cuff she is going to check it. I spoke with her about being more involved with Stephen Bartlett if possible.     - Confirm BP cuff at home  - Continue carvedilol 6.25 mg BID    4. Hypokalemia- k is 3.1 today at last check but patient was out of K supplement. He restarted and we will repeat labs.   - Continue potassium 20 mEq daily    5. Medication Management- called sister Consuella Lose today. She is willing to fill Stephen Bartlett's pill box once per week when he comes to visit her as well as check his BP.   - Will follow up with sister to make sure patient doing well with meds and BP.     Follow- up: 2 week f/up for BP and adherence check, 25m f/u with Dr. [name]    ______________________________________________________________________    Reason for visit: Darolutamide monitoring and side effect management    Current Drug and Dose:  Darolutamide 600 mg BID    Date of Initiation: 08/07/19-08/12/19 (stopped due to headache), restarted 09/20/19    Oral Agent Toxicities:  Dizziness    Interval History:  Stephen Bartlett reports feeling overall well he denies any headaches this time around. He says he does has some transient dizziness right after he takes a dose of his cancer medication but not bothersome and he is able to function properly. He says he is taking all medications as prescribed. He is going to visit his sister tomorrow, I asked him to bring his BP cuff and his pills/pillbox so she can help him with his medications. He agreed to this.     I called Consuella Lose and spoke with her personally. I explained the struggles Stephen Bartlett has been having with his medications and asked if she would be willing to help with his medications but filling his pill box once per week and checking his blood pressure when he comes to visit her. She agreed and will call him later to remind him to bring all his medications and pill box with him when he visits her tomorrow.     Adherence: Reports taking 2 tablets twice daily with food and no missed doses since restarting    Drug  Interactions: none    Oncology History   Malignant neoplasm of prostate (CMS-HCC)   09/30/2011 Initial Diagnosis    Malignant neoplasm of prostate (CMS-HCC)     11/23/2018 Endocrine/Hormone Therapy    OP LEUPROLIDE (ELIGARD) 45 MG EVERY 6 MONTHS  Plan Provider: Chrisandra Netters, MD     Bone metastases (CMS-HCC)   08/24/2017 Initial Diagnosis    Bone metastases (CMS-HCC)     11/23/2018 Endocrine/Hormone Therapy    OP LEUPROLIDE (ELIGARD) 45 MG EVERY 6 MONTHS  Plan Provider: Chrisandra Netters, MD         Vital Signs for this encounter:  There were no vitals taken for this visit.  Wt Readings from Last 3 Encounters:   09/13/19 78.4 kg (172 lb 14.4 oz)   06/28/19 77.2 kg (170 lb 4.8 oz)   11/02/18 76 kg (167 lb 8 oz) Medications:  Current Outpatient Medications   Medication Sig Dispense Refill   ??? ibuprofen (MOTRIN) 600 MG tablet Take 600 mg by mouth every eight (8) hours as needed for pain.     ??? allopurinoL (ZYLOPRIM) 100 MG tablet Take 1 tablet (100 mg total) by mouth daily. 90 tablet 3   ??? atorvastatin (LIPITOR) 10 MG tablet Take 10 mg by mouth nightly.      ??? carvediloL (COREG) 6.25 MG tablet Take 1 tablet (6.25 mg total) by mouth Two (2) times a day. 60 tablet 11   ??? darolutamide (NUBEQA) 300 mg tablet Take 2 tablets (600 mg total) by mouth 2 (two) times a day with meals. Take with food. Swallow tablets whole. 120 tablet 11   ??? dexAMETHasone (DECADRON) 2 MG tablet Take 1 tablet (2 mg total) by mouth daily. 90 tablet 0   ??? docusate sodium (COLACE) 100 MG capsule Take 100 mg by mouth two (2) times a day as needed for constipation.     ??? famotidine (PEPCID) 20 MG tablet TAKE 1 TABLET(20 MG) BY MOUTH DAILY 90 tablet 3   ??? oxyCODONE (ROXICODONE) 5 MG immediate release tablet Take 1 tablet (5 mg total) by mouth every eight (8) hours as needed for pain. 90 tablet 0   ??? polyethylene glycol (MIRALAX) 17 gram packet Take 17 g by mouth two (2) times a day as needed.     ??? potassium chloride 20 mEq TbER Take 20 mEq by mouth daily. 30 tablet 11     No current facility-administered medications for this visit.        LABS:  Lab Results   Component Value Date    WBC 8.8 09/13/2019    HGB 11.9 (L) 09/13/2019    HCT 37.6 (L) 09/13/2019    PLT 233 09/13/2019       Lab Results   Component Value Date    NA 137 09/13/2019    K 3.1 (L) 09/13/2019    CL 99 09/13/2019    CO2 34.0 (H) 09/13/2019    BUN 20 09/13/2019    CREATININE 0.85 09/13/2019    GLU 98 09/13/2019    CALCIUM 8.8 09/13/2019    MG 2.1 08/12/2017    PHOS 3.3 08/12/2017       Lab Results   Component Value Date    BILITOT 0.6 09/13/2019    PROT 7.0 09/13/2019    ALBUMIN 3.8 09/13/2019    ALT 21 09/13/2019    AST 26 09/13/2019    ALKPHOS 74 09/13/2019       Lab Results   Component Value Date  INR 1.41 02/17/2017    APTT 27.9 02/17/2017     I spent 20 minutes in direct patient care.    Laverna Peace PharmD, BCOP, CPP  Hematology/Oncology Pharmacist  P: [phone]

## 2019-10-15 NOTE — Unmapped (Signed)
[facility] [address] [facility] Clinical Assessment & Refill Coordination Note    Stephen Bartlett, DOB: Apr 14, 1941  Phone: [phone] (home)     All above HIPAA information was verified with patient.     Was a Nurse, learning disability used for this call? No    Specialty Medication(s):   Hematology/Oncology: Nubeqa     Current Outpatient Medications   Medication Sig Dispense Refill   ??? allopurinoL (ZYLOPRIM) 100 MG tablet Take 1 tablet (100 mg total) by mouth daily. 90 tablet 3   ??? atorvastatin (LIPITOR) 10 MG tablet Take 10 mg by mouth nightly.      ??? carvediloL (COREG) 6.25 MG tablet Take 1 tablet (6.25 mg total) by mouth Two (2) times a day. 60 tablet 11   ??? darolutamide (NUBEQA) 300 mg tablet Take 2 tablets (600 mg total) by mouth 2 (two) times a day with meals. Take with food. Swallow tablets whole. 120 tablet 11   ??? dexAMETHasone (DECADRON) 2 MG tablet Take 1 tablet (2 mg total) by mouth daily. 90 tablet 0   ??? docusate sodium (COLACE) 100 MG capsule Take 100 mg by mouth two (2) times a day as needed for constipation.     ??? famotidine (PEPCID) 20 MG tablet TAKE 1 TABLET(20 MG) BY MOUTH DAILY 90 tablet 3   ??? ibuprofen (MOTRIN) 600 MG tablet Take 600 mg by mouth every eight (8) hours as needed for pain.     ??? oxyCODONE (ROXICODONE) 5 MG immediate release tablet Take 1 tablet (5 mg total) by mouth every eight (8) hours as needed for pain. 90 tablet 0   ??? polyethylene glycol (MIRALAX) 17 gram packet Take 17 g by mouth two (2) times a day as needed.     ??? potassium chloride 20 mEq TbER Take 20 mEq by mouth daily. 30 tablet 11     No current facility-administered medications for this visit.         Changes to medications: Stephen Bartlett reports no changes at this time.    No Known Allergies    Changes to allergies: No    SPECIALTY MEDICATION ADHERENCE     Nubeqa 300 mg: 3-4 days of medicine on hand     Medication Adherence    Patient reported X missed doses in the last month: 1-2  Specialty Medication: Nubeqa 300mg   Patient is on additional specialty medications: No  Informant: patient  Confirmed plan for next specialty medication refill: delivery by pharmacy          Specialty medication(s) dose(s) confirmed: Regimen is correct and unchanged.     Are there any concerns with adherence? Yes: Pt missed at least 2 doses in past month.  Received pill box and son helps to fill box weekly.    Adherence counseling provided? Not needed    CLINICAL MANAGEMENT AND INTERVENTION      Clinical Benefit Assessment:    Do you feel the medicine is effective or helping your condition? Yes    Clinical Benefit counseling provided? Not needed    Adverse Effects Assessment:    Are you experiencing any side effects? Yes, patient reports experiencing pain. Side effect counseling provided: is using oxycontin to help with pain    Are you experiencing difficulty administering your medicine? No    Quality of Life Assessment:    How many days over the past month did your prostate cancer  keep you from your normal activities? For example, brushing your teeth or getting up in the  morning. 0    Have you discussed this with your provider? Not needed    Therapy Appropriateness:    Is therapy appropriate? Yes, therapy is appropriate and should be continued    DISEASE/MEDICATION-SPECIFIC INFORMATION      N/A    PATIENT SPECIFIC NEEDS     ? Does the patient have any physical, cognitive, or cultural barriers? No    ? Is the patient high risk? Yes, patient is taking oral chemotherapy. Appropriateness of therapy as been assessed.     ? Does the patient require a Care Management Plan? No     ? Does the patient require physician intervention or other additional services (i.e. nutrition, smoking cessation, social work)? No      SHIPPING     Specialty Medication(s) to be Shipped:   Hematology/Oncology: Nubeqa    Other medication(s) to be shipped: none     Changes to insurance: No    Delivery Scheduled: Yes, Expected medication delivery date: 10/17/19.     Medication will be delivered via Next Day Courier to the confirmed prescription address in [facility].    The patient will receive a drug information handout for each medication shipped and additional FDA Medication Guides as required.  Verified that patient has previously received a Conservation officer, historic buildings.    All of the patient's questions and concerns have been addressed.    Breck Coons [facility] Specialty Pharmacist

## 2019-10-16 MED FILL — NUBEQA 300 MG TABLET: 30 days supply | Qty: 120 | Fill #3 | Status: AC

## 2019-10-16 MED FILL — NUBEQA 300 MG TABLET: ORAL | 30 days supply | Qty: 120 | Fill #3

## 2019-10-23 ENCOUNTER — Institutional Professional Consult (permissible substitution): Admit: 2019-10-23 | Discharge: 2019-10-24 | Payer: MEDICARE

## 2019-10-23 DIAGNOSIS — G893 Neoplasm related pain (acute) (chronic): Principal | ICD-10-CM

## 2019-10-23 MED ORDER — DEXAMETHASONE 2 MG TABLET
ORAL_TABLET | Freq: Every day | ORAL | 0 refills | 90.00000 days | Status: CP
Start: 2019-10-23 — End: ?

## 2019-10-23 NOTE — Unmapped (Signed)
No reply to both numbers.  NP sent a text to patient's cell and I have yet to hear back from him.  I will follow-up tomorrow. No visit today.    Burtis Junes, FNP-BC, [facility]  Outpatient Oncology Palliative Care Service  [facility]  [address] [place], [place] [address]  [phone]

## 2019-10-23 NOTE — Unmapped (Signed)
OUTPATIENT ONCOLOGY PALLIATIVE CARE    Principal Diagnosis: [name] is a 79 y.o. 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     Assessment/Plan:   1.  Upper back pain due to bone mets from prostate cancer-controlled with current regimen  -Continue Decadron 2 mg and pepcid 20 mg qd  -Continue oxycodone 5 mg every 8 hours as needed pain.  Notices that when it is damp outside of needs sometimes 3 tablets/day.  -Continue motrin 600 mg-usually 1 tab/day      2. Support  -Will be receiving the first Covid vaccine this upcoming Friday 3/5    3. Feet edema-occurs intermittently and presently mild  -Patient sister is checking blood pressure and had it checked about 4 to 5 days ago and shared it was okay.  Is active with the [facility].    4 Advanced care planning-  -his healthcare decision-maker is his son [name].    -At prior visits: NP did discuss that in the state of [place], his wife will be his agent unless he does get this in writing.  -Patient wants his son to be his primary agent, but he does not have advanced care planning documents.  His secondary agent would be his spouse.  -Encouraged patient to reach out to his son, who is a Clinical research associate for this documentation.  -Patient does not have a cell phone or computer at home, so it is difficult to share information.  -Discussed values and maintaining his independence and his relationship with his son are important to him.  His strengths include his religious faith and is grateful for his family and being able to live independently.          # Controlled substances risk management.   - Patient does not have a signed pain medication agreement with our team, but this was discussed verbally.   - [place]CCSRS database was reviewed today and it was appropriate.   - Urine drug screen was not performed at this visit. Findings: not applicable.   - Patient has received information about safe storage and administration of medications.   - Patient has not received a prescription for narcan.      F/u: 4/19 8:30 phone  ----------------------------------------  Referring Provider: Dr. [name]  Oncology Team: Dr. 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  In January 2019, patient had cord compression and subsequent surgery & XRT with a rod from C7-T8.  His prior tx have been Radium 223, Avodart/Casodex and abiraterone/Lupron. Restaging scans 3/20 (post-Redium) are stable/slightly improved. He is also being followed for a neuroendocrine tumor grade 1 pancreatic mass.     Patient's primary symptom is his upper back pain which is exacerbated by changes in the weather.  He reports that the pain is currently at 7/10, but he has not taken the a.m. ibuprofen 600 mg and the Decadron 2 mg.  He does find some relief with this.  Has some difficulty using the pain scores.  Feels that he still not getting adequate amount of relief.  He describes the pain as a numbing and aching sensation.  He had tried Tylenol in the past with no relief. Does endorse history of marijuana use.  No other illicit drug use and opioid risk assessment tool was done today.    Patient shared that when he had his spine surgery he could not walk and it took  much time and energy for him to regain his strength and walk again. He still drives and is able to do some cooking in the home.  He is independent with his personal ADLs.    ??  Current cancer-directed therapy: ADT- Eligard 45 (6 month injection)  and  Darolutamide         Interval hx, 08/21/19-CK    Patient was hospitalized from 12/2-12/4 for complications from Covid.  He had acute hypoxic respiratory failure with mild diarrhea.  He was sent home with home oxygen and steroids.  Shares that he is hardly using the oxygen now.    Interval hx 10/23/19 CK    Noting some mild bilateral feet edema.  His sister has been checking the blood pressure and it has been okay. Symptom Review:  General: Good  Pain: Continues to have upper back pain, overall stable.  Using oxycodone 5 mg 1-2 times a day, but when it rains sometimes he needs it 3 times a day for colder damp days.  Continues on Decadron 2 mg daily and taking usually taking only 600 mg of Motrin 1 time per day  Fatigue: Fair.  Remains independent with ADLs and cooks on his own.  He has been driving.  Mobility: Uses a cane when he leaves his home.   Sleep: Not assessed  Appetite: Describes as good.  Nausea: Denies  Bowel function: Stools are slightly loose, so he is holding the MiraLAX and Colace.  Usually has a bowel movement daily.  Has as needed Colace and MiraLAX.  Dyspnea: Denies issues.  Mood: Remains positive and grateful.  Commenting on how supportive his son has been for him.    Palliative Performance Scale: 80% - Ambulation: Full / Normal Activity with effort, some evidence of disease / Self-Care:Full / Intake: Normal or reduced / Level of Conscious: Full      Coping/Support Issues: Has the support of his spiritual practice.  He had been attending the [employer] on a regular basis prior to  COVID-19.  His most supportive people are his son, who is a Clinical research associate in [place] and his sister who is a Runner, broadcasting/film/video in [place].  He also has a sister who is a retired Charity fundraiser that lives about 1 hour away.  He does live with his wife since 83.  It is a second marriage for him.    Goals of Care: To feel better    Social History:   Name of primary support: Son, wife and sister  Occupation: Retired.  He had worked in the [employer]  Hobbies: Enjoys visiting family and sitting on the front porch.  Current residence / distance from [facility]: lives in [place].  [place] minutes from [facility].  Shares it is easy to get to the [facility]    Advance Care Planning:   HCPOA:  Natural surrogate decision maker: His son would be his primary agent and his wife would be the secondary  Living Will: No  ACP note:   CODE STATUS: Full Objective     Opioid Risk Tool:    Male  Male    Family history of substance abuse      Alcohol  1  3    Illegal drugs  2  3    Rx drugs  4  4    Personal history of substance abuse      Alcohol  3  3    Illegal drugs  4  4    Rx  drugs  5  5    Age between 69???45 years  1  1    History of preadolescent sexual abuse  3  0    Psychological disease      ADD, OCD, bipolar, schizophrenia  2  2    Depression  1  1       Total: 0  (<3 low risk, 4-7 moderate risk, >8 high risk)    Oncology History   Malignant neoplasm of prostate (CMS-HCC)   09/30/2011 Initial Diagnosis    Malignant neoplasm of prostate (CMS-HCC)     11/23/2018 Endocrine/Hormone Therapy    OP LEUPROLIDE (ELIGARD) 45 MG EVERY 6 MONTHS  Plan Provider: Chrisandra Netters, MD     Bone metastases (CMS-HCC)   08/24/2017 Initial Diagnosis    Bone metastases (CMS-HCC)     11/23/2018 Endocrine/Hormone Therapy    OP LEUPROLIDE (ELIGARD) 45 MG EVERY 6 MONTHS  Plan Provider: Chrisandra Netters, MD         Patient Active Problem List   Diagnosis   ??? Malignant neoplasm of prostate (CMS-HCC)   ??? 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 Hypertension   ??? Calculus of kidney   ??? Acute renal failure (CMS-HCC)   ??? Foreign body in bladder and urethra   ??? Uric acid nephrolithiasis   ??? Renal cyst, acquired, left   ??? Pancreatic mass   ??? Acute pancreatitis   ??? Bronchiectasis (CMS-HCC)   ??? Acute kidney injury (CMS-HCC)   ??? Lesion of pancreas   ??? Bone metastases (CMS-HCC)       Past Medical History:   Diagnosis Date   ??? Calculus of kidney    ??? Hypertension    ??? Malignant neoplasm of prostate (CMS-HCC)        Past Surgical History:   Procedure Laterality Date   ??? PR ARTHRODESIS POSTERIOR/POSTERIORLATERAL CERVICAL BELOW C2 Midline 08/05/2017    Procedure: ARTHRODESIS, POSTERIOR OR POSTEROLATERAL TECHNIQUE, SINGLE LEVEL; CERVICAL BELOW C2 SEGMENT;  Surgeon: Timothy Lasso, MD;  Location: MAIN OR [facility];  Service: Ortho Spine   ??? PR ARTHRODESIS POSTERIOR/POSTEROLATERAL EA ADDL Midline 08/05/2017 Procedure: ARTHRODESIS, POSTERIOR OR POSTEROLATERAL TECHNIQUE, SINGLE LEVEL; EACH ADDITIONAL VERTEBRAL SEGMENT;  Surgeon: Timothy Lasso, MD;  Location: MAIN OR [facility];  Service: Ortho Spine   ??? PR LAMINEC/FACETECT/FORAMIN,CERVICAL 1 SEG Midline 08/05/2017    Procedure: LAMINECTOMY SNGL VERTEBRAL SEGMT-UNI/BIL; CERV;  Surgeon: Timothy Lasso, MD;  Location: MAIN OR [facility];  Service: Ortho Spine   ??? PR LAMINEC/FACETECT/FORAMIN,EACH ADDNL Midline 08/05/2017    Procedure: LAMINECTMY 1 SEGMT-UNI/BIL; EA ADD CERV/THOR/LUM;  Surgeon: Timothy Lasso, MD;  Location: MAIN OR [facility];  Service: Ortho Spine   ??? PR POSTERIOR SEGMENTAL INSTRUMENTATION 7-12 VRT SEG Midline 08/05/2017    Procedure: POSTERIOR SEGMENTAL INSTRUMENTATION; (EG, PEDICLE FIXATION, DUAL RODS W/MULT HOOKS/WIRES) 7-12 VERTEB SEGMT;  Surgeon: Timothy Lasso, MD;  Location: MAIN OR [facility];  Service: Ortho Spine   ??? PR UPGI ENDOSCOPY,FN NEEDLE BX,GUIDED N/A 02/11/2017    Procedure: UGI W/TRANSENDOSCOPIC US-GUIDE INTRA/TRANSMURAL NEEDLE ASP/BX (INCL EXAM ESOPHAGUS, STOMACH, DOUDENUM/JEJ);  Surgeon: Vonda Antigua, MD;  [facility]: [facility];  Service: Gastroenterology   ??? URETEROSCOPY         Current Outpatient Medications   Medication Sig Dispense Refill   ??? allopurinoL (ZYLOPRIM) 100 MG tablet Take 1 tablet (100 mg total) by mouth daily. 90 tablet 3   ??? atorvastatin (LIPITOR) 10 MG tablet Take 10 mg by mouth nightly.      ???  carvediloL (COREG) 6.25 MG tablet Take 1 tablet (6.25 mg total) by mouth Two (2) times a day. 60 tablet 11   ??? darolutamide (NUBEQA) 300 mg tablet Take 2 tablets (600 mg total) by mouth 2 (two) times a day with meals. Take with food. Swallow tablets whole. 120 tablet 11   ??? dexAMETHasone (DECADRON) 2 MG tablet Take 1 tablet (2 mg total) by mouth daily. 90 tablet 0   ??? docusate sodium (COLACE) 100 MG capsule Take 100 mg by mouth two (2) times a day as needed for constipation.     ??? famotidine (PEPCID) 20 MG tablet TAKE 1 TABLET(20 MG) BY MOUTH DAILY 90 tablet 3   ??? ibuprofen (MOTRIN) 600 MG tablet Take 600 mg by mouth every eight (8) hours as needed for pain.     ??? oxyCODONE (ROXICODONE) 5 MG immediate release tablet Take 1 tablet (5 mg total) by mouth every eight (8) hours as needed for pain. 90 tablet 0   ??? polyethylene glycol (MIRALAX) 17 gram packet Take 17 g by mouth two (2) times a day as needed.     ??? potassium chloride 20 mEq TbER Take 20 mEq by mouth daily. 30 tablet 11     No current facility-administered medications for this visit.        Allergies: No Known Allergies    Family History:  Cancer-related family history includes Prostate cancer in an other family member. There is no history of Kidney cancer.  He indicated that the status of his mother is unknown. He indicated that the status of his neg hx is unknown. He indicated that the status of his other is unknown.        Lab Results   Component Value Date    CREATININE 0.85 09/13/2019     Lab Results   Component Value Date    ALKPHOS 74 09/13/2019    BILITOT 0.6 09/13/2019    PROT 7.0 09/13/2019    ALBUMIN 3.8 09/13/2019    ALT 21 09/13/2019    AST 26 09/13/2019       Pam Drown, FNP-BC, [facility]  Outpatient Oncology Palliative Care Service  [facility]  [address] [place], [place] [address]  [phone]      I spent 17 minutes on the phone with the patient. I spent an additional 10 minutes on pre- and post-visit activities.     The patient was physically located in [place] or a state in which I am permitted to provide care. The patient and/or parent/gauardian understood that s/he may incur co-pays and cost sharing, and agreed to the telemedicine visit. The visit was completed via phone and/or video, which was appropriate and reasonable under the circumstances given the patient's presentation at the time.    The patient and/or parent/guardian has been advised of the potential risks and limitations of this mode of treatment (including, but not limited to, the absence of in-person examination) and has agreed to be treated using telemedicine. The patient's/patient's family's questions regarding telemedicine have been answered.     If the phone/video visit was completed in an ambulatory setting, the patient and/or parent/guardian has also been advised to contact their provider???s office for worsening conditions, and seek emergency medical treatment and/or call 911 if the patient deems either necessary.

## 2019-10-25 NOTE — Unmapped (Signed)
Patient Stephen Bartlett was contacted today regarding follow up with Katina Degree. Voicemail was left for patient with information to call back.     Thanks   [employer]

## 2019-11-05 ENCOUNTER — Institutional Professional Consult (permissible substitution)
Admit: 2019-11-05 | Discharge: 2019-11-06 | Payer: MEDICARE | Attending: Pharmacist Clinician (PhC)/ Clinical Pharmacy Specialist | Primary: Pharmacist Clinician (PhC)/ Clinical Pharmacy Specialist

## 2019-11-05 NOTE — Unmapped (Signed)
Clinical Pharmacist Practitioner: [facility]    Patient Name: Stephen Bartlett  Patient Age: 79 y.o.    I am seeing Stephen Bartlett  today for medication management.     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 ??Also has neuroendocrine tumor grade 1 pancreatic mass.??Restaging scans 06/2019 and??10/2018 have been stable??post-Radium.??His pain is generally well controlled with PRN analgesics, and mainly is in his back at the area of prior surgery where he has a rod, it is worse when it is rainy. ??Despite stable scans, PSA has been rising, from 31 in 08/2018 to 60 in 05/2019. Patient was also recently admitted for COVID on 11/30 and discharged 12/02.     1. mCRPC - patient taking Darolutamide 600 mg PO BID, currently taking as prescribed. All other labs besides K WNL. PSA from 09/13/19 = 45.9.   - Continue darolutamide 600 mg BID  - Eligard 45 mg due on or after 03/13/20    2. Edema- reports intermittent edema that resolves with elevation.   - Needs assessment at next visit  ??  3. Pain - pain is managed with Oxycodone 5 mg PO BID and dexamethasone  - Managed by palliative care  ??  4. HTN - patient will be going to sisters house tomorrow who has a BP cuff she is going to check it. I spoke with her about being more involved with Stephen Bartlett if possible. Patient says he saw his sister on Sunday and she took his BP which was good.   - Continue carvedilol 6.25 mg BID    5. Hypokalemia- k is 3.1 today at last check but patient was out of K supplement. He restarted and we will repeat labs.   - Continue potassium 20 mEq daily, recheck K at next lab check    6. Medication Management- Consuella Lose Mr. Cadogan's sister is helping with medications and BP monitoring  - Will follow up with sister to make sure patient doing well with meds and BP.     Follow- up: Dr. [name] on 11/22/19, CPP follow up in 1 month ______________________________________________________________________    Reason for visit:  Darolutamide monitoring and side effect management    Current Drug and Dose:  Darolutamide 600 mg BID    Date of Initiation: 08/07/19-08/12/19 (stopped due to headache), restarted 09/20/19    Oral Agent Toxicities:  Dizziness    Interval History:  Stephen Bartlett reports feeling overall well. He does report some edema in his feet that is intermittent. He says if he elevates them it goes down, so usually at night the swelling resolves. He continues to see his sister who is helping him with monitoring his BP and medication management. He saw her on Sunday and reports his BP was normal.    Adherence: Reports taking 2 tablets twice daily with food and no missed doses since restarting    Drug Interactions: none    Oncology History   Malignant neoplasm of prostate (CMS-HCC)   09/30/2011 Initial Diagnosis    Malignant neoplasm of prostate (CMS-HCC)     11/23/2018 Endocrine/Hormone Therapy    OP LEUPROLIDE (ELIGARD) 45 MG EVERY 6 MONTHS  Plan Provider: Chrisandra Netters, MD     Bone metastases (CMS-HCC)   08/24/2017 Initial Diagnosis    Bone metastases (CMS-HCC)     11/23/2018 Endocrine/Hormone Therapy    OP LEUPROLIDE (ELIGARD) 45 MG EVERY 6 MONTHS  Plan Provider: Chrisandra Netters,  MD         Vital Signs for this encounter:  There were no vitals taken for this visit.  Wt Readings from Last 3 Encounters:   09/13/19 78.4 kg (172 lb 14.4 oz)   06/28/19 77.2 kg (170 lb 4.8 oz)   11/02/18 76 kg (167 lb 8 oz)       Medications:  Current Outpatient Medications   Medication Sig Dispense Refill   ??? allopurinoL (ZYLOPRIM) 100 MG tablet Take 1 tablet (100 mg total) by mouth daily. 90 tablet 3   ??? atorvastatin (LIPITOR) 10 MG tablet Take 10 mg by mouth nightly.      ??? carvediloL (COREG) 6.25 MG tablet Take 1 tablet (6.25 mg total) by mouth Two (2) times a day. 60 tablet 11   ??? darolutamide (NUBEQA) 300 mg tablet Take 2 tablets (600 mg total) by mouth 2 (two) times a day with meals. Take with food. Swallow tablets whole. 120 tablet 11   ??? dexAMETHasone (DECADRON) 2 MG tablet Take 1 tablet (2 mg total) by mouth daily. 90 tablet 0   ??? docusate sodium (COLACE) 100 MG capsule Take 100 mg by mouth two (2) times a day as needed for constipation.     ??? famotidine (PEPCID) 20 MG tablet TAKE 1 TABLET(20 MG) BY MOUTH DAILY 90 tablet 3   ??? ibuprofen (MOTRIN) 600 MG tablet Take 600 mg by mouth every eight (8) hours as needed for pain.     ??? oxyCODONE (ROXICODONE) 5 MG immediate release tablet Take 1 tablet (5 mg total) by mouth every eight (8) hours as needed for pain. 90 tablet 0   ??? polyethylene glycol (MIRALAX) 17 gram packet Take 17 g by mouth two (2) times a day as needed.     ??? potassium chloride 20 mEq TbER Take 20 mEq by mouth daily. 30 tablet 11     No current facility-administered medications for this visit.        LABS:  Lab Results   Component Value Date    WBC 8.8 09/13/2019    HGB 11.9 (L) 09/13/2019    HCT 37.6 (L) 09/13/2019    PLT 233 09/13/2019       Lab Results   Component Value Date    NA 137 09/13/2019    K 3.1 (L) 09/13/2019    CL 99 09/13/2019    CO2 34.0 (H) 09/13/2019    BUN 20 09/13/2019    CREATININE 0.85 09/13/2019    GLU 98 09/13/2019    CALCIUM 8.8 09/13/2019    MG 2.1 08/12/2017    PHOS 3.3 08/12/2017       Lab Results   Component Value Date    BILITOT 0.6 09/13/2019    PROT 7.0 09/13/2019    ALBUMIN 3.8 09/13/2019    ALT 21 09/13/2019    AST 26 09/13/2019    ALKPHOS 74 09/13/2019       Lab Results   Component Value Date    INR 1.41 02/17/2017    APTT 27.9 02/17/2017     I spent 20 minutes in direct patient care.    Laverna Peace PharmD, BCOP, CPP  Hematology/Oncology Pharmacist  P: [phone]

## 2019-11-06 NOTE — Unmapped (Signed)
I spoke with patient Stephen Bartlett to confirm appointments on the following date(s): 11/22/19 appt with Dr. [name] will be in person; pt is aware he will have to be at [facility] at 9:00am for 9:30am labs.    Estevan Oaks

## 2019-11-07 NOTE — Unmapped (Signed)
[facility] Refill Coordination Note    Specialty Medication(s) to be Shipped:   Hematology/Oncology: Nubeqa    Other medication(s) to be shipped: n/a     Stephen Bartlett, DOB: 1941/05/05  Phone: [phone] (home)       All above HIPAA information was verified with patient.     Was a Nurse, learning disability used for this call? No    Completed refill call assessment today to schedule patient's medication shipment from the [facility] [phone]).       Specialty medication(s) and dose(s) confirmed: Regimen is correct and unchanged.   Changes to medications: Stephen Bartlett reports no changes at this time.  Changes to insurance: No  Questions for the pharmacist: No    Confirmed patient received Welcome Packet with first shipment. The patient will receive a drug information handout for each medication shipped and additional FDA Medication Guides as required.       DISEASE/MEDICATION-SPECIFIC INFORMATION        N/A    SPECIALTY MEDICATION ADHERENCE     Medication Adherence    Patient reported X missed doses in the last month: 0  Specialty Medication: Nubeqa 300mg   Patient is on additional specialty medications: No  Informant: patient                Nubeqa 300 mg: 15 days of medicine on hand         SHIPPING     Shipping address confirmed in Epic.     Delivery Scheduled: Yes, Expected medication delivery date: 11/20/19.     Medication will be delivered via Next Day Courier to the prescription address in [place].    Stephen Bartlett   [facility] Specialty Technician

## 2019-11-19 MED FILL — NUBEQA 300 MG TABLET: ORAL | 30 days supply | Qty: 120 | Fill #4

## 2019-11-19 MED FILL — NUBEQA 300 MG TABLET: 30 days supply | Qty: 120 | Fill #4 | Status: AC

## 2019-11-20 NOTE — Unmapped (Signed)
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  Also has neuroendocrine tumor grade 1 pancreatic mass.  Restaging scans 06/2019 and 10/2018 were stable post-Radium.  He started darolutamide (held briefly during a hospitalization for COVID-19 in late 2020).  PSA was 31 in 08/2018 to 60 in 05/2019, then 51.5 on 06/28/2019 and 45.9 on 09/13/19 and 34.5 on 11/22/19 with castrate testosterone levels.      He has had challenges with organizing medications at home.  He lives with his wife who works and is not able to help with this, and his son lives in [place] but is becoming more involved.    He is followed by Palliative Care for back pain in his back at the area of prior surgery where he has a rod.  He is on Decadron and Oxycodone.  He has few treatment options, and he understands the goals of care are supportive and palliative.  He has done advanced care planning with Palliative Care.    Plan:  - Continue ADT; Eligard every 52-months (09/13/2019).  - Continue darolutamide.  - Follow PSA.  - Blood pressure control with Katina Degree, PharmD.  - Followed by Palliative Care for pain control, thank you.  - Prior increased frequency of urination:  Better since stopping tamsulsoin. PVR has been low.  - LEE, improved with diuretic with his PCP -- he will follow up with PCP about this.  - Pain: Steroids have helped, ibuprofen has not. Continue dexamethasone.  - HTN  - Follow PSA.  - LEE: Diuretic as needed with his PCP and Palliative Care.  - Code status: discuss next visit    - Advanced care planning: has been done with Palliative Care.    I spend 25 minutes with him and 25 minutes working on figuring out medications, connecting with Pharmacy and navigation about his coordination of care.    Follow up with Katina Degree PharmD about medications and blood pressure.  Follow up with me in 4 months for assessment and ADT with labs.    ---------------------------------------------------------    HPI:  - Prior patient of Dr. Verl Bangs  - 2013: Presented with metastatic prostate cancer to bone, PSA ~900, started Lupron, then Avodart/Casodex, then abiraterone/Lupron  - 1/19: Experienced cord compression while on Lupron/abiraterone --> RT  - Neuroendocrine tumor grade 1 pancreatic mass will be observed.  - 08/11/17: Bone scan: c/w 5/18, stable metastatic disease in the thoracic spine and right hemipelvis.  Redemonstration of radiotracer uptake in the upper thoracic vertebral body. Slightly decreased uptake within the right hemipelvis, consistent with previously seen mixed sclerotic/lytic lesions. No new areas of radiotracer uptake. Physiologic uptake is seen in the kidneys and bladder.  - 09/08/17: CT A/P: 1. Interval improvement in previously visualized pancreatic tail stranding and free fluid with lobulated intermediate density peripancreatic collection along the pancreatic tail, closely abutting the anterior aspect of the spleen. Findings may represent complex pseudocyst in the setting of prior pancreatitis. MRI MRCP is recommended for further evaluation as known neuroendocrine pancreatic tumor is not well delineated on this portal venous phase CT and to exclude postcontrast enhancement of the suspected complex pancreatic pseudocyst along the tail.  2. Right lower lobe bronchiectasis with mucous plugging.  3. Hepatic hypodensities, unchanged and favored to represent hepatic cysts. This may be also be confirmed by MRI.  2/19: Discontinued abiraterone  6-11/19: Radium-223  11/02/18: CT A/P: No new lytic or blastic osseous lesions. Similar appearance of mixed sclerotic and lytic lesion in the right hemipelvis.  11/02/18: Bone Scan: Slightly decrease in radiotracer uptake involving the thoracic spine and right hemipelvis when compared to prior imaging.  05/31/19: PSA 60.4  06/25/19: CT A/P: Similar appearance of mixed sclerotic and lytic lesions in the right hemipelvis. No new lytic or blastic lesions.  Similar appearance of hyperenhancing pancreatic body mass, compatible with pancreatic neuroendocrine tumor.  Decreased size of fluid collection adjacent to the pancreatic tail/inferior margin of the spleen.  Bronchiectasis with mucous plugging in the right lower lobe, similar to prior.  06/25/19: Similar focal increase in radiotracer uptake within the right hemipelvis when compared to prior bone scan from 11/02/2018. Stable focal uptake in the upper thoracic vertebral bodies.    ROS: Feeling well today. Pain is stable, no new pain. Urinary frequency better since stopping tamsulosin.    Current Outpatient Medications on File Prior to Visit   Medication Sig Dispense Refill   ??? allopurinoL (ZYLOPRIM) 100 MG tablet Take 1 tablet (100 mg total) by mouth daily. 90 tablet 3   ??? atorvastatin (LIPITOR) 10 MG tablet Take 10 mg by mouth nightly.      ??? carvediloL (COREG) 6.25 MG tablet Take 1 tablet (6.25 mg total) by mouth Two (2) times a day. 60 tablet 11   ??? darolutamide (NUBEQA) 300 mg tablet Take 2 tablets (600 mg total) by mouth 2 (two) times a day with meals. Take with food. Swallow tablets whole. 120 tablet 11   ??? dexAMETHasone (DECADRON) 2 MG tablet Take 1 tablet (2 mg total) by mouth daily. 90 tablet 0   ??? docusate sodium (COLACE) 100 MG capsule Take 100 mg by mouth two (2) times a day as needed for constipation.     ??? famotidine (PEPCID) 20 MG tablet TAKE 1 TABLET(20 MG) BY MOUTH DAILY 90 tablet 3   ??? ibuprofen (MOTRIN) 600 MG tablet Take 600 mg by mouth every eight (8) hours as needed for pain.     ??? oxyCODONE (ROXICODONE) 5 MG immediate release tablet Take 1 tablet (5 mg total) by mouth every eight (8) hours as needed for pain. 90 tablet 0   ??? polyethylene glycol (MIRALAX) 17 gram packet Take 17 g by mouth two (2) times a day as needed.     ??? potassium chloride 20 mEq TbER Take 20 mEq by mouth daily. 30 tablet 11     No current facility-administered medications on file prior to visit.        PE:  There were no vitals taken for this visit.  NAD  A+Ox3  No rash  Abd ND    Data:    PSA   Date Value Ref Range Status   09/13/2019 45.90 (H) 0.00 - 4.00 ng/mL Final   06/28/2019 51.50 (H) 0.00 - 4.00 ng/mL Final   05/31/2019 60.40 (H) 0.00 - 4.00 ng/mL Final   08/31/2018 31.30 (H) 0.00 - 4.00 ng/mL Final   07/06/2018 35.00 (H) 0.00 - 4.00 ng/mL Final   06/01/2018 33.10 (H) 0.00 - 4.00 ng/mL Final   05/04/2018 42.00 (H) 0.00 - 4.00 ng/mL Final   03/30/2018 42.70 (H) 0.00 - 4.00 ng/mL Final   01/26/2018 46.20 (H) 0.00 - 4.00 ng/mL Final   08/25/2017 61.50 (H) 0.00 - 4.00 ng/mL Final   08/08/2017 80.90 (H) 0.00 - 4.00 ng/mL Final   05/19/2017 34.70 (H)  0.00 - 4.00 ng/mL Final   02/10/2017 30.50 (H) 0.00 - 4.00 ng/mL Final   01/20/2017 19.60 (H) 0.00 - 4.00 ng/mL Final   01/07/2017 18.70 (H) 0.00 - 4.00 ng/mL Final   12/22/2016 19.40 (H) 0.00 - 4.00 ng/mL Final   12/03/2016 31.70 (H) 0.00 - 4.00 ng/mL Final   11/04/2016 30.30 (H) 0.00 - 4.00 ng/mL Final   08/06/2016 18.80 (H) 0.00 - 4.00 ng/mL Final   05/06/2016 24.10 (H) 0.00 - 4.00 ng/mL Final   01/29/2016 12.50 (H) 0.00 - 4.00 ng/mL Final   10/30/2015 11.10 (H) 0.00 - 4.00 ng/mL Final   07/24/2015 7.41 (H) 0.00 - 4.00 ng/mL Final   04/03/2015 3.44 0.00 - 4.00 ng/mL Final   12/23/2014 12.50 (H) 0.00 - 4.00 ng/mL Final

## 2019-11-22 ENCOUNTER — Ambulatory Visit: Admit: 2019-11-22 | Discharge: 2019-11-23 | Payer: MEDICARE | Attending: Medical Oncology | Primary: Medical Oncology

## 2019-11-22 ENCOUNTER — Other Ambulatory Visit: Admit: 2019-11-22 | Discharge: 2019-11-23 | Payer: MEDICARE

## 2019-11-22 DIAGNOSIS — C61 Malignant neoplasm of prostate: Principal | ICD-10-CM

## 2019-11-22 DIAGNOSIS — C7951 Secondary malignant neoplasm of bone: Secondary | ICD-10-CM

## 2019-11-22 DIAGNOSIS — R600 Localized edema: Principal | ICD-10-CM

## 2019-11-22 DIAGNOSIS — I10 Essential (primary) hypertension: Principal | ICD-10-CM

## 2019-11-22 LAB — COMPREHENSIVE METABOLIC PANEL
ALT (SGPT): 15 U/L (ref <50)
ANION GAP: 7 mmol/L (ref 7–15)
AST (SGOT): 24 U/L (ref 19–55)
BILIRUBIN TOTAL: 0.8 mg/dL (ref 0.0–1.2)
BLOOD UREA NITROGEN: 15 mg/dL (ref 7–21)
BUN / CREAT RATIO: 17
CALCIUM: 8.5 mg/dL (ref 8.5–10.2)
CHLORIDE: 102 mmol/L (ref 98–107)
CO2: 32 mmol/L — ABNORMAL HIGH (ref 22.0–30.0)
CREATININE: 0.87 mg/dL (ref 0.70–1.30)
EGFR CKD-EPI AA MALE: >90 mL/min/{1.73_m2} (ref >=60)
EGFR CKD-EPI NON-AA MALE: 83 mL/min/{1.73_m2} (ref >=60)
GLUCOSE RANDOM: 86 mg/dL (ref 70–179)
POTASSIUM: 3 mmol/L — ABNORMAL LOW (ref 3.5–5.0)
PROTEIN TOTAL: 6.7 g/dL (ref 6.5–8.3)
SODIUM: 141 mmol/L (ref 135–145)

## 2019-11-22 LAB — CBC W/ AUTO DIFF
BASOPHILS ABSOLUTE COUNT: 0 10*9/L (ref 0.0–0.1)
BASOPHILS RELATIVE PERCENT: 0.3 %
EOSINOPHILS ABSOLUTE COUNT: 0 10*9/L (ref 0.0–0.4)
EOSINOPHILS RELATIVE PERCENT: 0.4 %
HEMATOCRIT: 39.4 % — ABNORMAL LOW (ref 41.0–53.0)
LARGE UNSTAINED CELLS: 1 % (ref 0–4)
LYMPHOCYTES ABSOLUTE COUNT: 1.1 10*9/L — ABNORMAL LOW (ref 1.5–5.0)
LYMPHOCYTES RELATIVE PERCENT: 12.7 %
MEAN CORPUSCULAR HEMOGLOBIN CONC: 31 g/dL (ref 31.0–37.0)
MEAN CORPUSCULAR HEMOGLOBIN: 29.7 pg (ref 26.0–34.0)
MEAN CORPUSCULAR VOLUME: 95.9 fL (ref 80.0–100.0)
MEAN PLATELET VOLUME: 7 fL (ref 7.0–10.0)
MONOCYTES ABSOLUTE COUNT: 0.6 10*9/L (ref 0.2–0.8)
NEUTROPHILS ABSOLUTE COUNT: 7 10*9/L (ref 2.0–7.5)
NEUTROPHILS RELATIVE PERCENT: 79 %
PLATELET COUNT: 184 10*9/L (ref 150–440)
RED BLOOD CELL COUNT: 4.11 10*12/L — ABNORMAL LOW (ref 4.50–5.90)
RED CELL DISTRIBUTION WIDTH: 15.4 % — ABNORMAL HIGH (ref 12.0–15.0)
WBC ADJUSTED: 8.8 10*9/L (ref 4.5–11.0)

## 2019-11-22 LAB — TESTOSTERONE TOTAL: Testosterone:MCnc:Pt:Ser/Plas:Qn:: <5 — ABNORMAL LOW

## 2019-11-22 LAB — MONOCYTES RELATIVE PERCENT: Monocytes/100 leukocytes:NFr:Pt:Bld:Qn:Automated count: 6.7

## 2019-11-22 LAB — PROSTATE SPECIFIC ANTIGEN: Prostate specific Ag:MCnc:Pt:Ser/Plas:Qn:: 34.5 — ABNORMAL HIGH

## 2019-11-22 LAB — ANION GAP: Anion gap 3:SCnc:Pt:Ser/Plas:Qn:: 7

## 2019-11-22 NOTE — Unmapped (Signed)
Nice seeing you today.   If you have any questions please call the Nurse Navigator in the office at [phone], or [phone].  Frederic Jericho, MD

## 2019-11-22 NOTE — Unmapped (Signed)
Peripheral stick done by [facility]inson, 23g L AC, labs drawn and sent.

## 2019-12-10 ENCOUNTER — Institutional Professional Consult (permissible substitution): Admit: 2019-12-10 | Discharge: 2019-12-11 | Payer: MEDICARE

## 2019-12-10 DIAGNOSIS — C7951 Secondary malignant neoplasm of bone: Principal | ICD-10-CM

## 2019-12-10 DIAGNOSIS — C61 Malignant neoplasm of prostate: Principal | ICD-10-CM

## 2019-12-10 DIAGNOSIS — G893 Neoplasm related pain (acute) (chronic): Principal | ICD-10-CM

## 2019-12-10 MED ORDER — OXYCODONE 5 MG TABLET
ORAL_TABLET | Freq: Three times a day (TID) | ORAL | 0 refills | 30.00000 days | Status: CP | PRN
Start: 2019-12-10 — End: ?

## 2019-12-10 NOTE — Unmapped (Signed)
OUTPATIENT ONCOLOGY PALLIATIVE CARE    Principal Diagnosis: Stephen Bartlett is a 79 y.o. 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     Assessment/Plan:   1.  Upper back pain due to bone mets from prostate cancer-controlled with current regimen  -Continue Decadron 2 mg and pepcid 20 mg qd  -Continue oxycodone 5 mg every 8 hours as needed pain.  Notices that when it is damp outside of needs sometimes 3 tablets/day.  -Continue motrin 600 mg-usually 1 tab/day      2. 4 Advanced care planning-  -his healthcare decision-maker is his son [name].    -At prior visits: NP did discuss that in the state of [place], his wife will be his agent unless he does get this in writing.  -Patient wants his son to be his primary agent, but he does not have advanced care planning documents.  His secondary agent would be his spouse.  -Encouraged patient to reach out to his son, who is a Clinical research associate for this documentation.  -Patient does not have a cell phone or computer at home, so it is difficult to share information.  -Discussed values and maintaining his independence and his relationship with his son are important to him.  His strengths include his religious faith and is grateful for his family and being able to live independently.          # Controlled substances risk management.   - Patient does not have a signed pain medication agreement with our team, but this was discussed verbally.   - [place]CC[place] database was reviewed today and it was appropriate.   - Urine drug screen was not performed at this visit. Findings: not applicable.   - Patient has received information about safe storage and administration of medications.   - Patient has not received a prescription for narcan.      F/u: 4/19 8:30 phone  ----------------------------------------  Referring Provider: Dr. [name]  Oncology Team: Dr. 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  In January 2019, patient had cord compression and subsequent surgery & XRT with a rod from C7-T8.  His prior tx have been Radium 223, Avodart/Casodex and abiraterone/Lupron. Restaging scans 3/20 (post-Redium) are stable/slightly improved. He is also being followed for a neuroendocrine tumor grade 1 pancreatic mass.     Patient's primary symptom is his upper back pain which is exacerbated by changes in the weather.  He reports that the pain is currently at 7/10, but he has not taken the a.m. ibuprofen 600 mg and the Decadron 2 mg.  He does find some relief with this.  Has some difficulty using the pain scores.  Feels that he still not getting adequate amount of relief.  He describes the pain as a numbing and aching sensation.  He had tried Tylenol in the past with no relief. Does endorse history of marijuana use.  No other illicit drug use and opioid risk assessment tool was done today.    Patient shared that when he had his spine surgery he could not walk and it took much time and energy for him to regain his strength and walk again. He still drives and is able to do some cooking in the home.  He is independent with his personal ADLs.    ??  Current cancer-directed therapy: ADT- Eligard 45 (6 month injection)  and  Darolutamide  Interval hx, 08/21/19-CK    Patient was hospitalized from 12/2-12/4 for complications from Covid.  He had acute hypoxic respiratory failure with mild diarrhea.  He was sent home with home oxygen and steroids.  Shares that he is hardly using the oxygen now.    Interval hx 12/10/19 CK      Symptom Review:  General: Good  Pain: Continues to have upper back pain, overall stable.  Using oxycodone 5 mg usually 2 times a day, but when it rains sometimes he needs it 3 times a day for colder damp days.  Continues on Decadron 2 mg daily  Fatigue: Energy is good.  Remains independent with ADLs and cooks on his own.  He has been driving almost daily.  Mobility: Uses a cane when he leaves his home.   Sleep: Not assessed  Appetite: Describes as good.  Bowel function: Usually has a bowel movement daily.  Has as needed Colace and MiraLAX.  Dyspnea: Describes as good  Mood: Describes as good.  His son was visiting this past weekend and cut his hair.    Palliative Performance Scale: 80% - Ambulation: Full / Normal Activity with effort, some evidence of disease / Self-Care:Full / Intake: Normal or reduced / Level of Conscious: Full      Coping/Support Issues: Has the support of his spiritual practice.  He had been attending the [employer] on a regular basis prior to  COVID-19.  His most supportive people are his son, who is a Clinical research associate in [place] and his sister who is a Runner, broadcasting/film/video in [place].  He also has a sister who is a retired Charity fundraiser that lives about 1 hour away.  He does live with his wife since 89.  It is a second marriage for him.    Goals of Care: To feel better    Social History:   Name of primary support: Son, wife and sister  Occupation: Retired.  He had worked in the [employer]  Hobbies: Enjoys visiting family and sitting on the front porch.  Current residence / distance from [facility]: lives in [place].  45 minutes from [facility].  Shares it is easy to get to the [facility]    Advance Care Planning:   HCPOA:  Natural surrogate decision maker: His son would be his primary agent and his wife would be the secondary  Living Will: No  ACP note:   CODE STATUS: Full    Objective     Opioid Risk Tool:    Male  Male    Family history of substance abuse      Alcohol  1  3    Illegal drugs  2  3    Rx drugs  4  4    Personal history of substance abuse      Alcohol  3  3    Illegal drugs  4  4    Rx drugs  5  5    Age between 15???45 years  1  1    History of preadolescent sexual abuse  3  0    Psychological disease      ADD, OCD, bipolar, schizophrenia  2  2    Depression  1  1       Total: 0  (<3 low risk, 4-7 moderate risk, >8 high risk)    Oncology History   Malignant neoplasm of prostate (CMS-HCC)   09/30/2011 Initial Diagnosis    Malignant neoplasm of prostate (CMS-HCC)  11/23/2018 Endocrine/Hormone Therapy    OP LEUPROLIDE (ELIGARD) 45 MG EVERY 6 MONTHS  Plan Provider: Chrisandra Netters, MD     Bone metastases (CMS-HCC)   08/24/2017 Initial Diagnosis    Bone metastases (CMS-HCC)     11/23/2018 Endocrine/Hormone Therapy    OP LEUPROLIDE (ELIGARD) 45 MG EVERY 6 MONTHS  Plan Provider: Chrisandra Netters, MD         Patient Active Problem List   Diagnosis   ??? Malignant neoplasm of prostate (CMS-HCC)   ??? 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 Hypertension   ??? Calculus of kidney   ??? Acute renal failure (CMS-HCC)   ??? Foreign body in bladder and urethra   ??? Uric acid nephrolithiasis   ??? Renal cyst, acquired, left   ??? Pancreatic mass   ??? Acute pancreatitis   ??? Bronchiectasis (CMS-HCC)   ??? Acute kidney injury (CMS-HCC)   ??? Lesion of pancreas   ??? Bone metastases (CMS-HCC)       Past Medical History:   Diagnosis Date   ??? Calculus of kidney    ??? Hypertension    ??? Malignant neoplasm of prostate (CMS-HCC)        Past Surgical History:   Procedure Laterality Date   ??? PR ARTHRODESIS POSTERIOR/POSTERIORLATERAL CERVICAL BELOW C2 Midline 08/05/2017    Procedure: ARTHRODESIS, POSTERIOR OR POSTEROLATERAL TECHNIQUE, SINGLE LEVEL; CERVICAL BELOW C2 SEGMENT;  Surgeon: Timothy Lasso, MD;  Location: MAIN OR [facility];  Service: Ortho Spine   ??? PR ARTHRODESIS POSTERIOR/POSTEROLATERAL EA ADDL Midline 08/05/2017    Procedure: ARTHRODESIS, POSTERIOR OR POSTEROLATERAL TECHNIQUE, SINGLE LEVEL; EACH ADDITIONAL VERTEBRAL SEGMENT;  Surgeon: Timothy Lasso, MD;  Location: MAIN OR [facility];  Service: Ortho Spine   ??? PR LAMINEC/FACETECT/FORAMIN,CERVICAL 1 SEG Midline 08/05/2017    Procedure: LAMINECTOMY SNGL VERTEBRAL SEGMT-UNI/BIL; CERV;  Surgeon: Timothy Lasso, MD;  Location: MAIN [facility];  Service: Ortho Spine   ??? PR LAMINEC/FACETECT/FORAMIN,EACH ADDNL Midline 08/05/2017    Procedure: LAMINECTMY 1 SEGMT-UNI/BIL; EA ADD CERV/THOR/LUM;  Surgeon: Timothy Lasso, MD;  Location: MAIN OR [facility];  Service: Ortho Spine   ??? PR POSTERIOR SEGMENTAL INSTRUMENTATION 7-12 VRT SEG Midline 08/05/2017    Procedure: POSTERIOR SEGMENTAL INSTRUMENTATION; (EG, PEDICLE FIXATION, DUAL RODS W/MULT HOOKS/WIRES) 7-12 VERTEB SEGMT;  Surgeon: Timothy Lasso, MD;  Location: MAIN OR [facility];  Service: Ortho Spine   ??? PR UPGI ENDOSCOPY,FN NEEDLE BX,GUIDED N/A 02/11/2017    Procedure: UGI W/TRANSENDOSCOPIC US-GUIDE INTRA/TRANSMURAL NEEDLE ASP/BX (INCL EXAM ESOPHAGUS, STOMACH, DOUDENUM/JEJ);  Surgeon: Vonda Antigua, MD;  [facility]: [facility];  Service: Gastroenterology   ??? URETEROSCOPY         Current Outpatient Medications   Medication Sig Dispense Refill   ??? allopurinoL (ZYLOPRIM) 100 MG tablet Take 1 tablet (100 mg total) by mouth daily. 90 tablet 3   ??? amLODIPine (NORVASC) 5 MG tablet Take 1 tablet by mouth daily.     ??? atorvastatin (LIPITOR) 10 MG tablet Take 10 mg by mouth nightly.      ??? carvediloL (COREG) 6.25 MG tablet Take 1 tablet (6.25 mg total) by mouth Two (2) times a day. 60 tablet 11   ??? darolutamide (NUBEQA) 300 mg tablet Take 2 tablets (600 mg total) by mouth 2 (two) times a day with meals. Take with food. Swallow tablets whole. 120 tablet 11   ??? dexAMETHasone (DECADRON) 2 MG tablet Take 1 tablet (2 mg total) by mouth daily. 90 tablet 0   ???  docusate sodium (COLACE) 100 MG capsule Take 100 mg by mouth two (2) times a day as needed for constipation.     ??? famotidine (PEPCID) 20 MG tablet TAKE 1 TABLET(20 MG) BY MOUTH DAILY 90 tablet 3   ??? ibuprofen (MOTRIN) 600 MG tablet Take 600 mg by mouth every eight (8) hours as needed for pain.     ??? oxyCODONE (ROXICODONE) 5 MG immediate release tablet Take 1 tablet (5 mg total) by mouth every eight (8) hours as needed for pain. 90 tablet 0   ??? polyethylene glycol (MIRALAX) 17 gram packet Take 17 g by mouth two (2) times a day as needed.     ??? potassium chloride 20 mEq TbER Take 20 mEq by mouth daily. 30 tablet 11     No current facility-administered medications for this visit.       Allergies: No Known Allergies    Family History:  Cancer-related family history includes Prostate cancer in an other family member. There is no history of Kidney cancer.  He indicated that the status of his mother is unknown. He indicated that the status of his neg hx is unknown. He indicated that the status of his other is unknown.        Lab Results   Component Value Date    CREATININE 0.87 11/22/2019     Lab Results   Component Value Date    ALKPHOS 76 11/22/2019    BILITOT 0.8 11/22/2019    PROT 6.7 11/22/2019    ALBUMIN 3.9 11/22/2019    ALT 15 11/22/2019    AST 24 11/22/2019       Pam Drown, FNP-BC, [facility]  Outpatient Oncology Palliative Care Service  [facility]  [address], [place], [place] [address]  [phone]      I spent 5 minutes on the phone with the patient. I spent an additional 5 minutes on pre- and post-visit activities.     The patient was physically located in [place] or a state in which I am permitted to provide care. The patient and/or parent/gauardian understood that s/he may incur co-pays and cost sharing, and agreed to the telemedicine visit. The visit was completed via phone and/or video, which was appropriate and reasonable under the circumstances given the patient's presentation at the time.    The patient and/or parent/guardian has been advised of the potential risks and limitations of this mode of treatment (including, but not limited to, the absence of in-person examination) and has agreed to be treated using telemedicine. The patient's/patient's family's questions regarding telemedicine have been answered.     If the phone/video visit was completed in an ambulatory setting, the patient and/or parent/guardian has also been advised to contact their provider???s office for worsening conditions, and seek emergency medical treatment and/or call 911 if the patient deems either necessary.

## 2019-12-10 NOTE — Unmapped (Signed)
[facility] Refill Coordination Note    Specialty Medication(s) to be Shipped:   Hematology/Oncology: Nubeqa 300mg     Other medication(s) to be shipped: n/a     Stephen Bartlett, DOB: July 26, 1941  Phone: [phone] (home)       All above HIPAA information was verified with patient.     Was a Nurse, learning disability used for this call? No    Completed refill call assessment today to schedule patient's medication shipment from the [facility] [phone]).       Specialty medication(s) and dose(s) confirmed: Regimen is correct and unchanged.   Changes to medications: Stephen Bartlett reports no changes at this time.  Changes to insurance: No  Questions for the pharmacist: No    Confirmed patient received Welcome Packet with first shipment. The patient will receive a drug information handout for each medication shipped and additional FDA Medication Guides as required.       DISEASE/MEDICATION-SPECIFIC INFORMATION        N/A    SPECIALTY MEDICATION ADHERENCE     Medication Adherence    Patient reported X missed doses in the last month: 0  Specialty Medication: Nubeqa 300mg   Patient is on additional specialty medications: No  Informant: patient                Nubeqa 300 mg: 15 days of medicine on hand         SHIPPING     Shipping address confirmed in Epic.     Delivery Scheduled: Yes, Expected medication delivery date: 12/21/19.     Medication will be delivered via Same Day Courier to the prescription address in [employer] [place].    [name]   [facility] Specialty Technician

## 2019-12-17 DIAGNOSIS — N200 Calculus of kidney: Principal | ICD-10-CM

## 2019-12-21 MED FILL — NUBEQA 300 MG TABLET: ORAL | 30 days supply | Qty: 120 | Fill #5

## 2019-12-21 MED FILL — NUBEQA 300 MG TABLET: 30 days supply | Qty: 120 | Fill #5 | Status: AC

## 2019-12-24 ENCOUNTER — Institutional Professional Consult (permissible substitution)
Admit: 2019-12-24 | Discharge: 2019-12-25 | Payer: MEDICARE | Attending: Pharmacist Clinician (PhC)/ Clinical Pharmacy Specialist | Primary: Pharmacist Clinician (PhC)/ Clinical Pharmacy Specialist

## 2019-12-24 NOTE — Unmapped (Signed)
Clinical Pharmacist Practitioner: [facility]- Telephone Encounter    I spent 10 minutes on the phone with the patient on the date of service. I spent an additional 10 minutes on pre- and post-visit activities on the date of service.     The patient was physically located in [place] or a state in which I am permitted to provide care. The patient and/or parent/guardian understood that s/he may incur co-pays and cost sharing, and agreed to the telemedicine visit. The visit was reasonable and appropriate under the circumstances given the patient's presentation at the time.    The patient and/or parent/guardian has been advised of the potential risks and limitations of this mode of treatment (including, but not limited to, the absence of in-person examination) and has agreed to be treated using telemedicine. The patient's/patient's family's questions regarding telemedicine have been answered.     If the visit was completed in an ambulatory setting, the patient and/or parent/guardian has also been advised to contact their provider???s office for worsening conditions, and seek emergency medical treatment and/or call 911 if the patient deems either necessary.      Patient Name: Stephen Bartlett  Patient Age: 79 y.o.    I am seeing Stephen Bartlett  today for medication management.     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 ??Also has neuroendocrine tumor grade 1 pancreatic mass.??Restaging scans 06/2019 and??10/2018 have been stable??post-Radium.??His pain is generally well controlled with PRN analgesics, and mainly is in his back at the area of prior surgery where he has a rod, it is worse when it is rainy. ??Despite stable scans, PSA has been rising, from 31 in 08/2018 to 60 in 05/2019. Patient was also recently admitted for COVID on 11/30 and discharged 12/02.     1. mCRPC - patient taking Darolutamide 600 mg PO BID, currently taking as prescribed. All other labs besides K WNL. PSA from 09/13/19 = 45.9.   - Continue darolutamide 600 mg BID  - Eligard 45 mg due on or after 03/13/20    2. Edema- reports intermittent edema that resolves with elevation.   - CTM  ??  3. Pain - pain is managed with Oxycodone 5 mg PO BID and dexamethasone  - Managed by palliative care  ??  4. HTN - sister helping with managing and monitoring his BP currently taking amlodipine and carvedilol  - Continue carvedilol 6.25 mg BID and amlodipine 5 mg daily    5. Hypokalemia- k is 3.0 at last check.    - Continue potassium 20 mEq daily, recheck K at next lab check    6. Medication Management- Consuella Lose Mr. Condrey's sister is helping with medications and BP monitoring  - Will follow up with sister to make sure patient doing well with meds and BP.     Follow- up: Dr. [name] on 04/03/20, CPP follow up in 3 month    ______________________________________________________________________    Reason for visit:  Darolutamide monitoring and side effect management    Current Drug and Dose:  Darolutamide 600 mg BID    Date of Initiation: 08/07/19-08/12/19 (stopped due to headache), restarted 09/20/19    Oral Agent Toxicities:  Dizziness    Interval History:  [name] contiunes to report some edema in his feet that is intermittent. He says if he elevates them it goes down, so usually at night the swelling resolves. He does not report any other symptoms  and continues to take his medications as directed. He says he feels pretty well and is doing good, and following all instructions.     Adherence: Reports taking 2 tablets twice daily with food and no missed doses since restarting     Drug Interactions: none    Oncology History   Malignant neoplasm of prostate (CMS-HCC)   09/30/2011 Initial Diagnosis    Malignant neoplasm of prostate (CMS-HCC)     11/23/2018 Endocrine/Hormone Therapy    OP LEUPROLIDE (ELIGARD) 45 MG EVERY 6 MONTHS  Plan Provider: Chrisandra Netters, MD     Bone metastases (CMS-HCC)   08/24/2017 Initial Diagnosis    Bone metastases (CMS-HCC)     11/23/2018 Endocrine/Hormone Therapy    OP LEUPROLIDE (ELIGARD) 45 MG EVERY 6 MONTHS  Plan Provider: Chrisandra Netters, MD         Vital Signs for this encounter:  There were no vitals taken for this visit.  Wt Readings from Last 3 Encounters:   11/22/19 82.9 kg (182 lb 11.2 oz)   09/13/19 78.4 kg (172 lb 14.4 oz)   06/28/19 77.2 kg (170 lb 4.8 oz)       Medications:  Current Outpatient Medications   Medication Sig Dispense Refill   ??? allopurinoL (ZYLOPRIM) 100 MG tablet Take 1 tablet (100 mg total) by mouth daily. 90 tablet 3   ??? amLODIPine (NORVASC) 5 MG tablet Take 1 tablet by mouth daily.     ??? atorvastatin (LIPITOR) 10 MG tablet Take 10 mg by mouth nightly.      ??? carvediloL (COREG) 6.25 MG tablet Take 1 tablet (6.25 mg total) by mouth Two (2) times a day. 60 tablet 11   ??? darolutamide (NUBEQA) 300 mg tablet Take 2 tablets (600 mg total) by mouth 2 (two) times a day with meals. Take with food. Swallow tablets whole. 120 tablet 11   ??? dexAMETHasone (DECADRON) 2 MG tablet Take 1 tablet (2 mg total) by mouth daily. 90 tablet 0   ??? docusate sodium (COLACE) 100 MG capsule Take 100 mg by mouth two (2) times a day as needed for constipation.     ??? famotidine (PEPCID) 20 MG tablet TAKE 1 TABLET(20 MG) BY MOUTH DAILY 90 tablet 3   ??? oxyCODONE (ROXICODONE) 5 MG immediate release tablet Take 1 tablet (5 mg total) by mouth every eight (8) hours as needed for pain. 90 tablet 0   ??? polyethylene glycol (MIRALAX) 17 gram packet Take 17 g by mouth two (2) times a day as needed.     ??? potassium chloride 20 mEq TbER Take 20 mEq by mouth daily. 30 tablet 11     No current facility-administered medications for this visit.       LABS:  Lab Results   Component Value Date    WBC 8.8 11/22/2019    HGB 12.2 (L) 11/22/2019    HCT 39.4 (L) 11/22/2019    PLT 184 11/22/2019       Lab Results   Component Value Date    NA 141 11/22/2019    K 3.0 (L) 11/22/2019    CL 102 11/22/2019    CO2 32.0 (H) 11/22/2019    BUN 15 11/22/2019    CREATININE 0.87 11/22/2019    GLU 86 11/22/2019    CALCIUM 8.5 11/22/2019    MG 2.1 08/12/2017    PHOS 3.3 08/12/2017       Lab Results   Component Value Date    BILITOT 0.8  11/22/2019    PROT 6.7 11/22/2019    ALBUMIN 3.9 11/22/2019    ALT 15 11/22/2019    AST 24 11/22/2019    ALKPHOS 76 11/22/2019       Lab Results   Component Value Date    INR 1.41 02/17/2017    APTT 27.9 02/17/2017     I spent 10 minutes in direct patient care.    Laverna Peace PharmD, BCOP, CPP  Hematology/Oncology Pharmacist  P: [phone]

## 2019-12-31 ENCOUNTER — Emergency Department
Admit: 2019-12-31 | Discharge: 2020-01-01 | Disposition: A | Discharge status: Home / Self Care | Payer: MEDICARE | Attending: Emergency Medicine

## 2019-12-31 ENCOUNTER — Ambulatory Visit
Admit: 2019-12-31 | Discharge: 2020-01-01 | Disposition: A | Discharge status: Home / Self Care | Payer: MEDICARE | Attending: Emergency Medicine

## 2019-12-31 DIAGNOSIS — R2243 Localized swelling, mass and lump, lower limb, bilateral: Principal | ICD-10-CM

## 2019-12-31 DIAGNOSIS — E876 Hypokalemia: Principal | ICD-10-CM

## 2019-12-31 LAB — EOSINOPHILS RELATIVE PERCENT: Eosinophils/100 leukocytes:NFr:Pt:Bld:Qn:Automated count: 0.1

## 2019-12-31 LAB — URINALYSIS WITH CULTURE REFLEX
BILIRUBIN UA: NEGATIVE
GLUCOSE UA: NEGATIVE
LEUKOCYTE ESTERASE UA: NEGATIVE
NITRITE UA: NEGATIVE
PROTEIN UA: NEGATIVE
RBC UA: 5 /HPF — ABNORMAL HIGH (ref <3)
SPECIFIC GRAVITY UA: 1.02 (ref 1.005–1.040)
SQUAMOUS EPITHELIAL: 2 /HPF (ref 0–5)

## 2019-12-31 LAB — TROPONIN I: Troponin I.cardiac:MCnc:Pt:Ser/Plas:Qn:: <0.06

## 2019-12-31 LAB — CBC W/ AUTO DIFF
BASOPHILS RELATIVE PERCENT: 0.6 %
EOSINOPHILS ABSOLUTE COUNT: 0 10*9/L (ref 0.0–0.7)
EOSINOPHILS RELATIVE PERCENT: 0.1 %
HEMATOCRIT: 37.2 % — ABNORMAL LOW (ref 38.0–50.0)
HEMOGLOBIN: 12.4 g/dL — ABNORMAL LOW (ref 13.5–17.5)
LYMPHOCYTES ABSOLUTE COUNT: 0.6 10*9/L — ABNORMAL LOW (ref 0.7–4.0)
LYMPHOCYTES RELATIVE PERCENT: 7.8 %
MEAN CORPUSCULAR HEMOGLOBIN CONC: 33.5 g/dL (ref 30.0–36.0)
MEAN CORPUSCULAR HEMOGLOBIN: 30.1 pg (ref 26.0–34.0)
MEAN CORPUSCULAR VOLUME: 89.8 fL (ref 81.0–95.0)
MEAN PLATELET VOLUME: 6.9 fL — ABNORMAL LOW (ref 7.0–10.0)
MONOCYTES ABSOLUTE COUNT: 0.4 10*9/L (ref 0.1–1.0)
MONOCYTES RELATIVE PERCENT: 4.8 %
NEUTROPHILS ABSOLUTE COUNT: 7.1 10*9/L (ref 1.7–7.7)
NEUTROPHILS RELATIVE PERCENT: 86.7 %
PLATELET COUNT: 192 10*9/L (ref 150–450)
RED BLOOD CELL COUNT: 4.14 10*12/L — ABNORMAL LOW (ref 4.32–5.72)
RED CELL DISTRIBUTION WIDTH: 16.2 % — ABNORMAL HIGH (ref 12.0–15.0)
WBC ADJUSTED: 8.2 10*9/L (ref 3.5–10.5)

## 2019-12-31 LAB — COMPREHENSIVE METABOLIC PANEL
ALKALINE PHOSPHATASE: 67 U/L (ref 46–116)
ALT (SGPT): 19 U/L (ref 10–49)
ANION GAP: 10 mmol/L (ref 3–11)
AST (SGOT): 20 U/L (ref <34)
BILIRUBIN TOTAL: 1.1 mg/dL (ref 0.3–1.2)
BLOOD UREA NITROGEN: 23 mg/dL (ref 9–23)
BUN / CREAT RATIO: 26
CALCIUM: 9.1 mg/dL (ref 8.7–10.4)
CHLORIDE: 101 mmol/L (ref 98–107)
CO2: 32.1 mmol/L — ABNORMAL HIGH (ref 20.0–31.0)
CREATININE: 0.89 mg/dL (ref 0.60–1.10)
EGFR CKD-EPI AA MALE: >90 mL/min/{1.73_m2}
EGFR CKD-EPI NON-AA MALE: 82 mL/min/{1.73_m2}
GLUCOSE RANDOM: 111 mg/dL (ref 70–179)
POTASSIUM: 3.1 mmol/L — ABNORMAL LOW (ref 3.5–5.1)
PROTEIN TOTAL: 6.9 g/dL (ref 5.7–8.2)
SODIUM: 143 mmol/L (ref 135–145)

## 2019-12-31 LAB — EGFR CKD-EPI AA MALE: Glomerular filtration rate/1.73 sq M.predicted.black:ArVRat:Pt:Ser/Plas/Bld:Qn:Creatinine-based formula (CKD-EPI): >90

## 2019-12-31 LAB — WBC UA: Leukocytes:Naric:Pt:Urine sed:Qn:Microscopy.light.HPF: 3 — ABNORMAL HIGH

## 2019-12-31 LAB — PRO-BNP: Natriuretic peptide.B prohormone N-Terminal:MCnc:Pt:Ser/Plas:Qn:: 108

## 2019-12-31 MED ORDER — FUROSEMIDE 20 MG TABLET
ORAL_TABLET | Freq: Every day | ORAL | 0 refills | 14.00000 days | Status: CP
Start: 2019-12-31 — End: 2020-01-14

## 2019-12-31 MED ORDER — POTASSIUM CHLORIDE ER 20 MEQ TABLET,EXTENDED RELEASE(PART/CRYST)
ORAL_TABLET | Freq: Two times a day (BID) | ORAL | 0 refills | 14.00000 days | Status: CP
Start: 2019-12-31 — End: 2020-01-14

## 2019-12-31 NOTE — Unmapped (Signed)
Pt was sent here from his regular doctor due to elevation in BP and swelling in his BLE. Pt stating it has been going on about a week or two. Pt stating he sometimes feels SOB when he is walking.

## 2019-12-31 NOTE — Unmapped (Signed)
[facility]  Emergency Department Provider Note      ED Clinical Impression     Final diagnoses:   Localized swelling of both lower legs (Primary)   Hypokalemia     Initial Impression, ED Course, Assessment and Plan     Impression: Stephen Bartlett is a 79 y.o. male with a PMH of HTN and castration resistant metastatic prostate cancer to bone s/p treatment with abiraterone/Lupron, and radium-223 (6/19 - 11/19), currently on Darolutamide 600 mg PO BID presenting to the ED for evaluation of 1 month of persistent swelling to his BLE localized from his knees to his feet without known triggers.    Initial vital signs with HTN to 163/93, otherwise WNL. On exam, patient is nontoxic appearing and in NAD. 2+ pitting edema to mid-calf bilaterally. No tenderness, erythema, or warmth. No skin breakage. 2+ DP pulses bilaterally. Heart rate normal and rhythm regular. Lungs clear to auscultation bilaterally. Abdomen is soft and non-tender.     Considering cardiac etiology vs venous stasis versus malnutrition less likely DVT given bilateral and symmetric swelling, symptoms that improve with extremity elevation and rest, lack of pain. Patient does have risk factor of CA however no other risk factors making this much lower on my differential.    Plan for CXR, basic labs, troponin, UA, and Pro-BNP. If labs are normal, plan to discharge with oral diuretic.    6:45 PM  CXR with streaky basilar opacities may reflect aspiration or infection.  Patient has no infectious signs or symptoms, satting 98% on room air, he denies any history of aspiration.  Will defer any treatment at this time given patient is asymptomatic and denying any chest pain or shortness of breath.  CBC and UA unremarkable. CMP with potassium of 3.1, otherwise unremarkable. Troponin negative.      7:53 PM  Pro-BNP WNL.     8:07 PM  Pt discharged with prescriptions for Lasix BID and potassium supplement every day for 2 weeks. PCP f/u advised in 1 week to recheck potassium level and to reassess fluid status.  I do suspect his lower extremity edema is related to venous stasis and I have also encouraged him to purchase compression stockings and placed an order for such.  Strict return precautions have been discussed. Pt is agreeable with this plan and expresses understanding.          ____________________________________________    Time seen: Dec 31, 2019 3:30 PM       History     Chief Complaint  Leg Swelling      HPI   Stephen Bartlett is a 79 y.o. male with a PMH of HTN and castration resistant metastatic prostate cancer to bone s/p treatment with abiraterone/Lupron, and radium-223 (6/19 - 11/19), currently on Darolutamide 600 mg PO BID presenting to the ED for evaluation of leg swelling. The patient reports 1 month of persistent swelling to his BLE localized from his knees to his feet. He denies any known triggers. His symptoms improve with elevating his BLE. The swelling is mild in the mornings, and worsen throughout the day. He states he was seen by his PCP earlier today who advised him to present to the ED. He is not currently taking a diuretic. Denies prostate surgeries for his prostate cancer.  No hx of PE or DVT. He is not currently anticoagulated. He reports chills, though these are unchanged from baseline. Denies fevers, chest pain, shortness of breath, or leg pain.     Per patient's  PCP records brought with pt to the ED, patient was sent to the ED for diuresis, CXR, and labs.    Past Medical History:   Diagnosis Date   ??? Calculus of kidney    ??? Hypertension    ??? Malignant neoplasm of prostate (CMS-HCC)        Past Surgical History:   Procedure Laterality Date   ??? PR ARTHRODESIS POSTERIOR/POSTERIORLATERAL CERVICAL BELOW C2 Midline 08/05/2017    Procedure: ARTHRODESIS, POSTERIOR OR POSTEROLATERAL TECHNIQUE, SINGLE LEVEL; CERVICAL BELOW C2 SEGMENT;  Surgeon: Timothy Lasso, MD;  Location: MAIN OR [facility];  Service: Ortho Spine   ??? PR ARTHRODESIS POSTERIOR/POSTEROLATERAL EA ADDL Midline 08/05/2017    Procedure: ARTHRODESIS, POSTERIOR OR POSTEROLATERAL TECHNIQUE, SINGLE LEVEL; EACH ADDITIONAL VERTEBRAL SEGMENT;  Surgeon: Timothy Lasso, MD;  Location: MAIN OR [facility];  Service: Ortho Spine   ??? PR LAMINEC/FACETECT/FORAMIN,CERVICAL 1 SEG Midline 08/05/2017    Procedure: LAMINECTOMY SNGL VERTEBRAL SEGMT-UNI/BIL; CERV;  Surgeon: Timothy Lasso, MD;  Location: MAIN OR [facility];  Service: Ortho Spine   ??? PR LAMINEC/FACETECT/FORAMIN,EACH ADDNL Midline 08/05/2017    Procedure: LAMINECTMY 1 SEGMT-UNI/BIL; EA ADD CERV/THOR/LUM;  Surgeon: Timothy Lasso, MD;  Location: MAIN OR [facility];  Service: Ortho Spine   ??? PR POSTERIOR SEGMENTAL INSTRUMENTATION 7-12 VRT SEG Midline 08/05/2017    Procedure: POSTERIOR SEGMENTAL INSTRUMENTATION; (EG, PEDICLE FIXATION, DUAL RODS W/MULT HOOKS/WIRES) 7-12 VERTEB SEGMT;  Surgeon: Timothy Lasso, MD;  Location: MAIN OR [facility];  Service: Ortho Spine   ??? PR UPGI ENDOSCOPY,FN NEEDLE BX,GUIDED N/A 02/11/2017    Procedure: UGI W/TRANSENDOSCOPIC US-GUIDE INTRA/TRANSMURAL NEEDLE ASP/BX (INCL EXAM ESOPHAGUS, STOMACH, DOUDENUM/JEJ);  Surgeon: Vonda Antigua, MD;  [facility];  Service: Gastroenterology   ??? URETEROSCOPY         No current facility-administered medications for this encounter.    Current Outpatient Medications:   ???  allopurinoL (ZYLOPRIM) 100 MG tablet, Take 1 tablet (100 mg total) by mouth daily., Disp: 90 tablet, Rfl: 3  ???  amLODIPine (NORVASC) 5 MG tablet, Take 1 tablet by mouth daily., Disp: , Rfl:   ???  atorvastatin (LIPITOR) 10 MG tablet, Take 10 mg by mouth nightly. , Disp: , Rfl:   ???  carvediloL (COREG) 6.25 MG tablet, Take 1 tablet (6.25 mg total) by mouth Two (2) times a day., Disp: 60 tablet, Rfl: 11  ???  darolutamide (NUBEQA) 300 mg tablet, Take 2 tablets (600 mg total) by mouth 2 (two) times a day with meals. Take with food. Swallow tablets whole., Disp: 120 tablet, Rfl: 11  ??? dexAMETHasone (DECADRON) 2 MG tablet, Take 1 tablet (2 mg total) by mouth daily., Disp: 90 tablet, Rfl: 0  ???  docusate sodium (COLACE) 100 MG capsule, Take 100 mg by mouth two (2) times a day as needed for constipation., Disp: , Rfl:   ???  famotidine (PEPCID) 20 MG tablet, TAKE 1 TABLET(20 MG) BY MOUTH DAILY, Disp: 90 tablet, Rfl: 3  ???  oxyCODONE (ROXICODONE) 5 MG immediate release tablet, Take 1 tablet (5 mg total) by mouth every eight (8) hours as needed for pain., Disp: 90 tablet, Rfl: 0  ???  polyethylene glycol (MIRALAX) 17 gram packet, Take 17 g by mouth two (2) times a day as needed., Disp: , Rfl:   ???  potassium chloride 20 mEq TbER, Take 20 mEq by mouth daily., Disp: 30 tablet, Rfl: 11    Allergies  Patient has no known allergies.    Family History  Problem Relation Age of Onset   ??? Hypertension Mother    ??? Stroke Mother    ??? Prostate cancer Other    ??? GU problems Neg Hx    ??? Kidney cancer Neg Hx    ??? Urolithiasis Neg Hx        Social History  Social History     Tobacco Use   ??? Smoking status: Never Smoker   ??? Smokeless tobacco: Never Used   Vaping Use   ??? Vaping Use: Never used   Substance Use Topics   ??? Alcohol use: Yes     Alcohol/week: 1.0 standard drinks     Types: 1 Cans of beer per week     Comment: now and then   ??? Drug use: Yes     Types: Marijuana     Comment: Every now and then for pain. per 01/31/17 report.        Review of Systems  Constitutional: Negative for fever.  Eyes: Negative for visual changes.  ENT: Negative for sore throat.  Cardiovascular: Negative for chest pain.  Respiratory: Negative for shortness of breath.  Gastrointestinal: Negative for abdominal pain, vomiting or diarrhea.  Genitourinary: Negative for dysuria.  Musculoskeletal: Positive for BLE swelling. Negative for back pain.  Skin: Negative for rash.  Neurological: Negative for headaches, focal weakness or numbness.    Physical Exam     VITAL SIGNS:    ED Triage Vitals   Enc Vitals Group      BP 12/31/19 1326 163/93 Heart Rate 12/31/19 1324 83      SpO2 Pulse --       Resp 12/31/19 1324 20      Temp 12/31/19 1324 36.9 ??C (98.4 ??F)      Temp Source 12/31/19 1324 Oral      SpO2 12/31/19 1324 95 %      Weight 12/31/19 1324 81.6 kg (180 lb)      Height 12/31/19 1324 1.676 m (5' 6)       Constitutional: Alert and oriented. Well appearing and in no distress.  Eyes: Conjunctivae are normal.  ENT       Head: Normocephalic and atraumatic.       Nose: No congestion.       Mouth/Throat: Mucous membranes are moist.       Neck: No stridor.  Hematological/Lymphatic/Immunilogical: No cervical lymphadenopathy.  Cardiovascular: Normal rate, regular rhythm. Normal and symmetric distal pulses are present in all extremities.  Respiratory: Normal respiratory effort. Breath sounds are normal.  Gastrointestinal: Soft and nontender. There is no CVA tenderness.  Musculoskeletal: Nontender with normal range of motion in all extremities. 2+ pitting edema to mid-calf bilaterally. No tenderness, erythema, or warmth. 2+ DP pulses bilaterally.  Neurologic: Normal speech and language. No gross focal neurologic deficits are appreciated.  Skin: No skin breakage to BLE. Skin is warm, dry and intact. No rash noted.  Psychiatric: Mood and affect are normal. Speech and behavior are normal.      Radiology     ECG 12 Lead    Result Date: 12/31/2019  SINUS RHYTHM WITH PREMATURE ATRIAL BEATS RIGHT ATRIAL ENLARGEMENT VOLTAGE CRITERIA FOR LEFT VENTRICULAR HYPERTROPHY NONSPECIFIC ST AND T WAVE ABNORMALITY PROLONGED QT ABNORMAL ECG WHEN COMPARED WITH ECG OF 05-Aug-2017 10:25, PREMATURE ATRIAL BEATS ARE NOW PRESENT    XR Chest 2 views    Result Date: 12/31/2019  EXAM: XR CHEST 2 VIEWS DATE: 12/31/2019 4:16 PM ACCESSION: [id number] UN DICTATED: 12/31/2019 4:18 PM INTERPRETATION LOCATION: [facility]     CLINICAL INDICATION: 79 years old Male with LE edema ; OTHER      COMPARISON: 08/05/2017 chest radiographs and 06/25/2019] CT abdomen pelvis     TECHNIQUE: PA and Lateral Chest Radiographs.     FINDINGS:     Bibasilar bronchiectasis and streaky opacities.     No pleural effusion or pneumothorax.     Unremarkable cardiomediastinal silhouette.             Streaky basilar opacities may reflect aspiration or infection.      Pertinent labs & imaging results that were available during my care of the patient were reviewed by me and considered in my medical decision making (see chart for details).    Documentation assistance was provided by Beola Cord, Scribe on Dec 31, 2019 at 3:30 PM for Ronal Fear, MD.    Jan 01, 2020 8:51 PM. Documentation assistance provided by the scribe. I was present during the time the encounter was recorded. The information recorded by the scribe was done at my direction and has been reviewed and validated by me.          Fredricka Bonine, MD  01/01/20 2051

## 2020-01-01 NOTE — Unmapped (Signed)
ISAR Screening- for all patients 65+ community-dwelling patients    1. Before the illness or injury that brought you to the Emergency Department, did you need someone to help you on a regular basis? NO  2. Since the illness or injury that brought you to the Emergency Department, have you needed more help than usual to take care of yourself? NO  3. Have you been hospitalized for one or more nights during the past six months (excluding a stay in the Emergency Department)? NO  4. In general, do you see well? YES  5. In general, do you have serious problems with your memory? NO  6. Do you take more than three different medications every day? YES    A score of 3 or more will require a Case Management consultation. Primary RN notified of score of 3 or more in order to input Case Management consult order.

## 2020-01-07 ENCOUNTER — Institutional Professional Consult (permissible substitution): Admit: 2020-01-07 | Discharge: 2020-01-08 | Payer: MEDICARE

## 2020-01-07 DIAGNOSIS — Z515 Encounter for palliative care: Principal | ICD-10-CM

## 2020-01-07 DIAGNOSIS — C61 Malignant neoplasm of prostate: Principal | ICD-10-CM

## 2020-01-07 DIAGNOSIS — C7951 Secondary malignant neoplasm of bone: Principal | ICD-10-CM

## 2020-01-07 DIAGNOSIS — G893 Neoplasm related pain (acute) (chronic): Principal | ICD-10-CM

## 2020-01-07 MED ORDER — OXYCODONE 5 MG TABLET
ORAL_TABLET | Freq: Three times a day (TID) | ORAL | 0 refills | 30 days | Status: CP | PRN
Start: 2020-01-07 — End: ?

## 2020-01-07 MED ORDER — ALLOPURINOL 100 MG TABLET
ORAL_TABLET | 3 refills | 0 days
Start: 2020-01-07 — End: ?

## 2020-01-07 MED ORDER — DEXAMETHASONE 0.5 MG TABLET
ORAL_TABLET | 0 refills | 0 days | Status: CP
Start: 2020-01-07 — End: ?

## 2020-01-07 NOTE — Unmapped (Signed)
OUTPATIENT ONCOLOGY PALLIATIVE CARE    Principal Diagnosis: [name] is a 79 y.o. 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     Assessment/Plan:   1.  Upper back pain due to bone mets from prostate cancer-controlled with current regimen  -Taper off Decadron 2 mg. Decadron 1 mg every am x 7 days and then 0.5 mg every am x 7 days and then every other am x 7 days. Last dose on 6/3.   -Continue oxycodone 5 mg every 8 hours as needed pain.   -Continue motrin 600 mg-prn 8 hrs    2. Sleep-poor due to taking steroids later in day.  -Will take decadron in am.    3. BLE edema-seen in ED last week.  -Continues with bid lasix and K+ and f/u with pcp in 1 wk    4. Support  -Recommended to get the Covid vaccine.  Shared that he will try to get this when he sees his PCP next week.      -Advanced care planning-not addressed today  -his healthcare decision-maker is his son Stephen Bartlett.    -At prior visits: NP did discuss that in the state of [place], his wife will be his agent unless he does get this in writing.  -Patient wants his son to be his primary agent, but he does not have advanced care planning documents.  His secondary agent would be his spouse.  -Encouraged patient to reach out to his son, who is a Clinical research associate for this documentation.  -Patient does not have a cell phone or computer at home, so it is difficult to share information.  -Discussed values and maintaining his independence and his relationship with his son are important to him.  His strengths include his religious faith and is grateful for his family and being able to live independently.          # Controlled substances risk management.   - Patient does not have a signed pain medication agreement with our team, but this was discussed verbally.   - [place]CCS[place] database was reviewed today and it was appropriate.   - Urine drug screen was not performed at this visit. Findings: not applicable.   - Patient has received information about safe storage and administration of medications.   - Patient has not received a prescription for narcan.      F/u:  6/21 9 am phone  ----------------------------------------  Referring Provider: Dr. [name]  Oncology Team: Dr. 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  In January 2019, patient had cord compression and subsequent surgery & XRT with a rod from C7-T8.  His prior tx have been Radium 223, Avodart/Casodex and abiraterone/Lupron. Restaging scans 3/20 (post-Redium) are stable/slightly improved. He is also being followed for a neuroendocrine tumor grade 1 pancreatic mass.     Patient's primary symptom is his upper back pain which is exacerbated by changes in the weather.  He reports that the pain is currently at 7/10, but he has not taken the a.m. ibuprofen 600 mg and the Decadron 2 mg.  He does find some relief with this.  Has some difficulty using the pain scores.  Feels that he still not getting adequate amount of relief.  He describes the pain as a numbing and aching sensation.  He had tried Tylenol in the past with no relief. Does endorse history of marijuana use.  No  other illicit drug use and opioid risk assessment tool was done today.    Patient shared that when he had his spine surgery he could not walk and it took much time and energy for him to regain his strength and walk again. He still drives and is able to do some cooking in the home.  He is independent with his personal ADLs.    ??  Current cancer-directed therapy: ADT- Eligard 45 (6 month injection)  and  Darolutamide         Interval hx, 08/21/19-CK    Patient was hospitalized from 12/2-12/4 for complications from Covid.  He had acute hypoxic respiratory failure with mild diarrhea.  He was sent home with home oxygen and steroids.  Shares that he is hardly using the oxygen now.    Interval hx 01/07/20 CK    Seen in ED last week and sent in by PCP for work up for BLE-on lasix and K+. Shares his legs are looking better and he is using the compression hose.  Has been active and visiting his sister and his son comes up to visit.  Still has not attended church yet but is planning to do this.  Has not had the Covid vaccine.      Symptom Review:  General: Good  Pain: Continues to have upper back pain, overall stable.  Using oxycodone 5 mg usually 2-3times a day, .  Continues on Decadron 2 mg daily. Not using motrin that much  Fatigue: Energy is good.  Remains independent with ADLs and cooks on his own.    Mobility: No issues  Sleep: not so good.  Appetite: Remains good  Bowel function: Usually has a bowel movement daily.  Has as needed Colace and MiraLAX.  Dyspnea: No issues  Mood: Describes as good.     Palliative Performance Scale: 80% - Ambulation: Full / Normal Activity with effort, some evidence of disease / Self-Care:Full / Intake: Normal or reduced / Level of Conscious: Full      Coping/Support Issues: Has the support of his spiritual practice.  He had been attending the [employer] on a regular basis prior to  COVID-19.  His most supportive people are his son, who is a Clinical research associate in [place] and his sister who is a Runner, broadcasting/film/video in [place].  He also has a sister who is a retired Charity fundraiser that lives about 1 hour away.  He does live with his wife since 23.  It is a second marriage for him.    Goals of Care: To feel better    Social History:   Name of primary support: Son, wife and sister  Occupation: Retired.  He had worked in the [employer]  Hobbies: Enjoys visiting family and sitting on the front porch.  Current residence / distance from [facility]: lives in [place].  45 minutes from [facility].  Shares it is easy to get to the [facility] campus    Advance Care Planning:   HCPOA:  Natural surrogate decision maker: His son would be his primary agent and his wife would be the secondary  Living Will: No  ACP note:   CODE STATUS: Full    Objective     Opioid Risk Tool: Male  Male    Family history of substance abuse      Alcohol  1  3    Illegal drugs  2  3    Rx drugs  4  4    Personal history of substance  abuse      Alcohol  3  3    Illegal drugs  4  4    Rx drugs  5  5    Age between 51???46 years  1  1    History of preadolescent sexual abuse  3  0    Psychological disease      ADD, OCD, bipolar, schizophrenia  2  2    Depression  1  1       Total: 0  (<3 low risk, 4-7 moderate risk, >8 high risk)    Oncology History   Malignant neoplasm of prostate (CMS-HCC)   09/30/2011 Initial Diagnosis    Malignant neoplasm of prostate (CMS-HCC)     11/23/2018 Endocrine/Hormone Therapy    OP LEUPROLIDE (ELIGARD) 45 MG EVERY 6 MONTHS  Plan Provider: Chrisandra Netters, MD     Bone metastases (CMS-HCC)   08/24/2017 Initial Diagnosis    Bone metastases (CMS-HCC)     11/23/2018 Endocrine/Hormone Therapy    OP LEUPROLIDE (ELIGARD) 45 MG EVERY 6 MONTHS  Plan Provider: Chrisandra Netters, MD         Patient Active Problem List   Diagnosis   ??? Malignant neoplasm of prostate (CMS-HCC)   ??? 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 Hypertension   ??? Calculus of kidney   ??? Acute renal failure (CMS-HCC)   ??? Foreign body in bladder and urethra   ??? Uric acid nephrolithiasis   ??? Renal cyst, acquired, left   ??? Pancreatic mass   ??? Acute pancreatitis   ??? Bronchiectasis (CMS-HCC)   ??? Acute kidney injury (CMS-HCC)   ??? Lesion of pancreas   ??? Bone metastases (CMS-HCC)       Past Medical History:   Diagnosis Date   ??? Calculus of kidney    ??? Hypertension    ??? Malignant neoplasm of prostate (CMS-HCC)        Past Surgical History:   Procedure Laterality Date   ??? PR ARTHRODESIS POSTERIOR/POSTERIORLATERAL CERVICAL BELOW C2 Midline 08/05/2017    Procedure: ARTHRODESIS, POSTERIOR OR POSTEROLATERAL TECHNIQUE, SINGLE LEVEL; CERVICAL BELOW C2 SEGMENT;  Surgeon: Timothy Lasso, MD;  Location: MAIN OR [facility];  Service: Ortho Spine   ??? PR ARTHRODESIS POSTERIOR/POSTEROLATERAL EA ADDL Midline 08/05/2017    Procedure: ARTHRODESIS, POSTERIOR OR POSTEROLATERAL TECHNIQUE, SINGLE LEVEL; EACH ADDITIONAL VERTEBRAL SEGMENT;  Surgeon: Timothy Lasso, MD;  Location: MAIN OR [facility];  Service: Ortho Spine   ??? PR LAMINEC/FACETECT/FORAMIN,CERVICAL 1 SEG Midline 08/05/2017    Procedure: LAMINECTOMY SNGL VERTEBRAL SEGMT-UNI/BIL; CERV;  Surgeon: Timothy Lasso, MD;  Location: MAIN OR [facility];  Service: Ortho Spine   ??? PR LAMINEC/FACETECT/FORAMIN,EACH ADDNL Midline 08/05/2017    Procedure: LAMINECTMY 1 SEGMT-UNI/BIL; EA ADD CERV/THOR/LUM;  Surgeon: Timothy Lasso, MD;  Location: MAIN OR [facility];  Service: Ortho Spine   ??? PR POSTERIOR SEGMENTAL INSTRUMENTATION 7-12 VRT SEG Midline 08/05/2017    Procedure: POSTERIOR SEGMENTAL INSTRUMENTATION; (EG, PEDICLE FIXATION, DUAL RODS W/MULT HOOKS/WIRES) 7-12 VERTEB SEGMT;  Surgeon: Timothy Lasso, MD;  Location: MAIN OR [facility];  Service: Ortho Spine   ??? PR UPGI ENDOSCOPY,FN NEEDLE BX,GUIDED N/A 02/11/2017    Procedure: UGI W/TRANSENDOSCOPIC US-GUIDE INTRA/TRANSMURAL NEEDLE ASP/BX (INCL EXAM ESOPHAGUS, STOMACH, DOUDENUM/JEJ);  Surgeon: Vonda Antigua, MD;  [facility]: [facility];  Service: Gastroenterology   ??? URETEROSCOPY         Current Outpatient Medications   Medication Sig Dispense Refill   ??? allopurinoL (ZYLOPRIM) 100 MG  tablet Take 1 tablet (100 mg total) by mouth daily. 90 tablet 3   ??? amLODIPine (NORVASC) 5 MG tablet Take 1 tablet by mouth daily.     ??? atorvastatin (LIPITOR) 10 MG tablet Take 10 mg by mouth nightly.      ??? carvediloL (COREG) 6.25 MG tablet Take 1 tablet (6.25 mg total) by mouth Two (2) times a day. 60 tablet 11   ??? darolutamide (NUBEQA) 300 mg tablet Take 2 tablets (600 mg total) by mouth 2 (two) times a day with meals. Take with food. Swallow tablets whole. 120 tablet 11   ??? dexAMETHasone (DECADRON) 2 MG tablet Take 1 tablet (2 mg total) by mouth daily. 90 tablet 0   ??? docusate sodium (COLACE) 100 MG capsule Take 100 mg by mouth two (2) times a day as needed for constipation.     ??? famotidine (PEPCID) 20 MG tablet TAKE 1 TABLET(20 MG) BY MOUTH DAILY 90 tablet 3   ??? furosemide (LASIX) 20 MG tablet Take 1 tablet (20 mg total) by mouth daily for 14 days. 14 tablet 0   ??? oxyCODONE (ROXICODONE) 5 MG immediate release tablet Take 1 tablet (5 mg total) by mouth every eight (8) hours as needed for pain. 90 tablet 0   ??? polyethylene glycol (MIRALAX) 17 gram packet Take 17 g by mouth two (2) times a day as needed.     ??? potassium chloride (KLOR-CON) 20 MEQ CR tablet Take 1 tablet (20 mEq total) by mouth Two (2) times a day for 14 days. 28 tablet 0     No current facility-administered medications for this visit.       Allergies: No Known Allergies    Family History:  Cancer-related family history includes Prostate cancer in an other family member. There is no history of Kidney cancer.  He indicated that the status of his mother is unknown. He indicated that the status of his neg hx is unknown. He indicated that the status of his other is unknown.        Lab Results   Component Value Date    CREATININE 0.89 12/31/2019     Lab Results   Component Value Date    ALKPHOS 67 12/31/2019    BILITOT 1.1 12/31/2019    PROT 6.9 12/31/2019    ALBUMIN 3.5 12/31/2019    ALT 19 12/31/2019    AST 20 12/31/2019       Pam Drown, FNP-BC, [facility]  Outpatient Oncology Palliative Care Service  [facility]  [address], [place], [place] [address]  [phone]      I spent 18 minutes on the phone with the patient. I spent an additional 10 minutes on pre- and post-visit activities.     The patient was physically located in [place] or a state in which I am permitted to provide care. The patient and/or parent/gauardian understood that s/he may incur co-pays and cost sharing, and agreed to the telemedicine visit. The visit was completed via phone and/or video, which was appropriate and reasonable under the circumstances given the patient's presentation at the time.    The patient and/or parent/guardian has been advised of the potential risks and limitations of this mode of treatment (including, but not limited to, the absence of in-person examination) and has agreed to be treated using telemedicine. The patient's/patient's family's questions regarding telemedicine have been answered.     If the phone/video visit was completed in an ambulatory setting, the patient and/or parent/guardian  has also been advised to contact their provider???s office for worsening conditions, and seek emergency medical treatment and/or call 911 if the patient deems either necessary.

## 2020-01-08 MED ORDER — DEXAMETHASONE 1 MG TABLET
ORAL_TABLET | Freq: Every morning | ORAL | 0 refills | 6.00000 days | Status: CP
Start: 2020-01-08 — End: 2020-01-14

## 2020-01-10 NOTE — Unmapped (Signed)
[facility] Clinical Assessment & Refill Coordination Note    Stephen Bartlett, DOB: August 29, 1940  Phone: [phone] (home)     All above HIPAA information was verified with patient.     Was a Nurse, learning disability used for this call? No    Specialty Medication(s):   Hematology/Oncology: Nubeqa     Current Outpatient Medications   Medication Sig Dispense Refill   ??? allopurinoL (ZYLOPRIM) 100 MG tablet Take 1 tablet (100 mg total) by mouth daily. 90 tablet 3   ??? amLODIPine (NORVASC) 5 MG tablet Take 1 tablet by mouth daily.     ??? atorvastatin (LIPITOR) 10 MG tablet Take 10 mg by mouth nightly.      ??? carvediloL (COREG) 6.25 MG tablet Take 1 tablet (6.25 mg total) by mouth Two (2) times a day. 60 tablet 11   ??? darolutamide (NUBEQA) 300 mg tablet Take 2 tablets (600 mg total) by mouth 2 (two) times a day with meals. Take with food. Swallow tablets whole. 120 tablet 11   ??? dexAMETHasone (DECADRON) 0.5 MG tablet Take 0.5 mg (1 tab) every morning starting on 5/24 to 5/30 and then only take every other day (on 6/1, 6/3 and last dose on 01/26/20) and then stop. 10 tablet 0   ??? dexAMETHasone (DECADRON) 1 MG tablet Take 1 tablet (1 mg total) by mouth every morning for 6 days. To take from 5/18 to 01/13/20 6 tablet 0   ??? docusate sodium (COLACE) 100 MG capsule Take 100 mg by mouth two (2) times a day as needed for constipation.     ??? famotidine (PEPCID) 20 MG tablet TAKE 1 TABLET(20 MG) BY MOUTH DAILY 90 tablet 3   ??? furosemide (LASIX) 20 MG tablet Take 1 tablet (20 mg total) by mouth daily for 14 days. 14 tablet 0   ??? ibuprofen (MOTRIN) 600 MG tablet Take 600 mg by mouth every eight (8) hours as needed for pain.     ??? oxyCODONE (ROXICODONE) 5 MG immediate release tablet Take 1 tablet (5 mg total) by mouth every eight (8) hours as needed for pain. 90 tablet 0   ??? polyethylene glycol (MIRALAX) 17 gram packet Take 17 g by mouth two (2) times a day as needed.     ??? potassium chloride (KLOR-CON) 20 MEQ CR tablet Take 1 tablet (20 mEq total) by mouth Two (2) times a day for 14 days. 28 tablet 0     No current facility-administered medications for this visit.        Changes to medications: Stephen Bartlett reports no changes at this time.    No Known Allergies    Changes to allergies: No    SPECIALTY MEDICATION ADHERENCE     Nubeqa 300 mg: 14-15 days of medicine on hand     Medication Adherence    Patient reported X missed doses in the last month: 0  Specialty Medication: Nubeqa 300 mg - 2 tablets bid  Patient is on additional specialty medications: No  Informant: patient  Confirmed plan for next specialty medication refill: delivery by pharmacy          Specialty medication(s) dose(s) confirmed: Regimen is correct and unchanged.     Are there any concerns with adherence? No    Adherence counseling provided? Not needed    CLINICAL MANAGEMENT AND INTERVENTION      Clinical Benefit Assessment:    Do you feel the medicine is effective or helping your condition? Yes  Clinical Benefit counseling provided? Not needed    Adverse Effects Assessment:    Are you experiencing any side effects? No    Are you experiencing difficulty administering your medicine? No    Quality of Life Assessment:    How many days over the past month did your prostate cancer  keep you from your normal activities? For example, brushing your teeth or getting up in the morning. 0    Have you discussed this with your provider? Not needed    Therapy Appropriateness:    Is therapy appropriate? Yes, therapy is appropriate and should be continued    DISEASE/MEDICATION-SPECIFIC INFORMATION      N/A    PATIENT SPECIFIC NEEDS     - Does the patient have any physical, cognitive, or cultural barriers? No    - Is the patient high risk? Yes, patient is taking oral chemotherapy. Appropriateness of therapy as been assessed.     - Does the patient require a Care Management Plan? No     - Does the patient require physician intervention or other additional services (i.e. nutrition, smoking cessation, social work)? No      SHIPPING     Specialty Medication(s) to be Shipped:   Hematology/Oncology: Nubeqa    Other medication(s) to be shipped: none     Changes to insurance: No    Delivery Scheduled: Yes, Expected medication delivery date: 01/24/20.     Medication will be delivered via Next Day Courier to the confirmed prescription address in [facility].    The patient will receive a drug information handout for each medication shipped and additional FDA Medication Guides as required.  Verified that patient has previously received a Conservation officer, historic buildings.    All of the patient's questions and concerns have been addressed.    Stephen Bartlett Shared [facility] Specialty Pharmacist

## 2020-01-24 MED FILL — NUBEQA 300 MG TABLET: 30 days supply | Qty: 120 | Fill #6 | Status: AC

## 2020-01-24 MED FILL — NUBEQA 300 MG TABLET: ORAL | 30 days supply | Qty: 120 | Fill #6

## 2020-01-29 NOTE — Unmapped (Signed)
Attempted to reach pt at cell, no VM set up.    Called pt son [phone]. He spoke to one of the triage RNs today at [phone]--was feeling better, encourage to try Boost.     RN discussed with son, ask pt when last BM was (had small BM yesterday), ask if he has any associated nausea with food, if he can look at what medicines hes been taking over last 24 hours, sleep, and intake from this evening until tomorrow mid day, call RN tomorrow mid day for check in. Verbalized agreement.

## 2020-01-29 NOTE — Unmapped (Incomplete)
[facility] Triage Note     Patient: Stephen Bartlett     Reason for call: return call    Time call returned: 1612   1615, 1630     Phone Assessment: Spoke with [name]. He stated that his father wanted to come in to be seen tomorrow, that he was not feeling good and not eating.    [id number] Spoke with patient, when asked how he was feeling if he felt sick he stated no he just has no appetite. He mentioned that he ran out of the 10 day medication that is spelled dexemathasone and that helped him eat, now he doesn't want to eat. He also ran out of the 14 day medicine furosemide and that his swelling has gone down in his legs. He denies nausea, no vomiting, no fever, chills because of the air conditioning, no abdominal pain, no urinary urgency or burning, last BM was small yesterday because he is not eating, and his urine was darker when asked.  He ate 1/2 bacon egg biscuit for breakfast yesterday and a little bit of ribs and potato salad for dinner. He had a bite of a Salmon patty this morning and is trying to eay spaghetti and meatballs.  He is only drinking 2 bottles of water today. He stated he is using a cane because he is weak and dizzy sometimes. Asked if he thinks he should be seen tomorrow and he said, I don't know do you think I should?  Encouraged him to snack throughout day with high protein snacks, try to eat 6 small meals and can have ice cream that he has at home, pudding and to try Ensure high protein shakes or Boost brand shakes. Increase his water intake to equal 64 ounces a day. Informed him that I would call in morning to check on him unless his son feels like he needs to be seen.  He was grateful and will wait by the phone.     33 Spoke with Stephen Bartlett patients son. He stated that his not what he told me, if you did not ask him if he was sick he would not tell you he will just answer your questions. Encouraged him to reach out to patient to see if he thinks that he needs to be seen and I will check needs to be seen.  He was grateful and will wait by the phone.     43 Spoke with Stephen Bartlett patients son. He stated that his not what he told me, if you did not ask him if he was sick he would not tell you he will just answer your questions. Encouraged him to reach out to patient to see if he thinks that he needs to be seen and I will check to see if he can be seen in Infusion clinic or if the Palliative care team would order something to stimulate his appetite.  Will call back in 20 minutes to check on response.          Outstanding tasks: Follow up with patient in AM.     Patient Pharmacy has been verified and primary pharmacy has been marked as preferred

## 2020-01-29 NOTE — Unmapped (Signed)
Hi,     [name] has contacted the Communication Center in regards to the following symptom:     Anorexia/Dehydration: unable to eat in past 24 hours      Please contact [name] at [phone]    Check Indicates criteria has been reviewed and confirmed with the patient:    [x]  Preferred Name   [x]  DOB and/or MR#  [x]  Preferred Contact Method  [x]  Phone Number(s)   []  MyChart     A page or telephone call has been made to the corresponding clinic.     Thank you,  Jacques Navy   [facility] Communication [facility]   [phone]

## 2020-02-05 DIAGNOSIS — R630 Anorexia: Principal | ICD-10-CM

## 2020-02-05 MED ORDER — MEGESTROL 400 MG/10 ML (10 ML) ORAL SUSPENSION
Freq: Every day | ORAL | 0 refills | 30.00000 days | Status: CP
Start: 2020-02-05 — End: 2020-03-06

## 2020-02-05 NOTE — Unmapped (Signed)
Hi,     Stephen Bartlett, son of patient contacted the Communication Center requesting to speak with the care team of Stephen Bartlett to discuss:    I spoke with the nurse yesterday. They told me to call back if my father still wasn't feeling well. They're suppose to call something in to the pharmacy to help with his appetite.    Please contact Stephen Bartlett at [phone].    Check Indicates criteria has been reviewed and confirmed with the patient:    [x]  Preferred Name   [x]  DOB and/or MR#  [x]  Preferred Contact Method  [x]  Phone Number(s)   []  MyChart     Thank you,   Laverna Peace  [facility] Communication [facility]   [phone]

## 2020-02-05 NOTE — Unmapped (Signed)
NP spoke with patient's son [name] and patient and discussed the decrease in appetite and some days of feeling like not having strength since stopping the Decadron several weeks ago.    Plan  Anorexia  -Receptive to dietitian consult  -Discussed resuming Decadron, but concerned about side effect profile  -Receptive to starting Megace 800 mg daily.  -Offered physical therapy, but will readdress with next visit if needed.  -Joint visit with son and patient on 6/21 via phone.    Burtis Junes, FNP-BC, [facility]  Outpatient Oncology Palliative Care Service  [facility]  [address], [place], [place] [address]  [phone]

## 2020-02-07 NOTE — Unmapped (Signed)
Nutrition Note    Pt is a 79 y.o. with Castration resistant metastatic prostate cancer to bone who is followed by Palliative Care.  Received consult due to poor appetite.  Called pt x 3 with no answer and no voicemail set up.  Pt also does not have MyChart or email.  Therefore, will send via [place] mail tip sheet on managing poor appetite with RD contact info included.    Neila Gear MS, RD, CSO, LDN

## 2020-02-11 ENCOUNTER — Institutional Professional Consult (permissible substitution): Admit: 2020-02-11 | Discharge: 2020-02-12 | Payer: MEDICARE

## 2020-02-11 DIAGNOSIS — Z515 Encounter for palliative care: Principal | ICD-10-CM

## 2020-02-11 DIAGNOSIS — G893 Neoplasm related pain (acute) (chronic): Principal | ICD-10-CM

## 2020-02-11 DIAGNOSIS — R630 Anorexia: Principal | ICD-10-CM

## 2020-02-11 DIAGNOSIS — C7951 Secondary malignant neoplasm of bone: Principal | ICD-10-CM

## 2020-02-11 DIAGNOSIS — C61 Malignant neoplasm of prostate: Principal | ICD-10-CM

## 2020-02-11 MED ORDER — OXYCODONE 5 MG TABLET
ORAL_TABLET | Freq: Three times a day (TID) | ORAL | 0 refills | 30 days | Status: CP | PRN
Start: 2020-02-11 — End: ?

## 2020-02-11 NOTE — Unmapped (Signed)
OUTPATIENT ONCOLOGY PALLIATIVE CARE    Principal Diagnosis: [name] is a 79 y.o. 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     Assessment/Plan:   1.  Upper back pain due to bone mets from prostate cancer-controlled with current regimen and no longer on decadron. Last dose decadron was on 01/24/20. (Trying to only use Decadron long-term for hospice patients).  -Continue oxycodone 5 mg every 8 hours as needed pain.       2.  Anorexia-worsened with stopping of Decadron and started Megace on 6/15 and patient has seen improvement with starting this and has stopped Megace in the last day or so.  Appears to be tolerating the Megace.    -Continue Megace 20 mL-800 mg daily or up 3 times per week.  Megace with a safer profile then Decadron.  Shared with patient and son that if he stops the Megace, I feel that his anorexia will continue.  -Consult for dietitian ordered.    3.  Right shin nonhealed wound-unsure of specifics due to this being a phone visit, but could be related to having a had a history of bilateral lower edema in the past.  Shares about the size of a nickel and it was draining but no draining now.  For  -Encouraged son to take a photo for baseline and recommended to be seen by PCP.  Shared that ulcerations can be painful and trying to avoid infections.      4. Support-son is working with a nurse friend who is in public health and trying to get a healthcare professional to check in on his dad on a weekly basis.  Willing to pay out-of-pocket for this service.  Patient is not eligible for home health due to not being homebound (they had worked with the patient in the past but discharged him due to not being homebound.)  Nurse friend also recommended podiatrist follow-up.    -Will reach out to Sherran Needs, MSW for community support for private pay help.  -Recommended to connect with PCP for podiatrist referral.      5. Advanced care planning-discussed benefits of having advance care planning documents completed.  Stephen Bartlett shared that his dad is receptive to completing a living well and healthcare power of attorney.  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  -his healthcare decision-maker is his son Stephen Bartlett.      -At prior visits: NP did discuss that in the state of [place], his wife will be his agent unless he does get this in writing.  -Patient wants his son to be his primary agent, but he does not have advanced care planning documents.  His secondary agent would be his spouse.  -Discussed values and maintaining his independence and his relationship with his son are important to him.  His strengths include his religious faith and is grateful for his family and being able to live independently.          # Controlled substances risk management.   - Patient does not have a signed pain medication agreement with our team, but this was discussed verbally.   - [place]CCS[place] database was reviewed today and it was appropriate.   - Urine drug screen was not performed at this visit. Findings: not applicable.   - Patient has received information about safe storage and administration of medications.   - Patient has not received a prescription for narcan.      F/u: 7/19 at 9 AM phone visit with son and patient. 8/12  visit in clinic with Dr. Genice Rouge.  ----------------------------------------  Referring Provider: Dr. Vernell Bartlett  Oncology Team: Dr. 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  In January 2019, patient had cord compression and subsequent surgery & XRT with a rod from C7-T8.  His prior tx have been Radium 223, Avodart/Casodex and abiraterone/Lupron. Restaging scans 3/20 (post-Redium) are stable/slightly improved. He is also being followed for a neuroendocrine tumor grade 1 pancreatic mass.     Patient's primary symptom is his upper back pain which is exacerbated by changes in the weather.  He reports that the pain is currently at 7/10, but he has not taken the a.m. ibuprofen 600 mg and the Decadron 2 mg.  He does find some relief with this.  Has some difficulty using the pain scores.  Feels that he still not getting adequate amount of relief.  He describes the pain as a numbing and aching sensation.  He had tried Tylenol in the past with no relief. Does endorse history of marijuana use.  No other illicit drug use and opioid risk assessment tool was done today.    Patient shared that when he had his spine surgery he could not walk and it took much time and energy for him to regain his strength and walk again. He still drives and is able to do some cooking in the home.  He is independent with his personal ADLs.    ??  Current cancer-directed therapy: ADT- Eligard 45 (6 month injection)  and  Darolutamide         Interval hx, 08/21/19-CK    Patient was hospitalized from 12/2-12/4 for complications from Covid.  He had acute hypoxic respiratory failure with mild diarrhea.  He was sent home with home oxygen and steroids.  Shares that he is hardly using the oxygen now.    Interval hx 02/11/20 CK-phone visit with son Stephen Bartlett and patient    Megace had helped improve appetite and patient stopped it in the past couple days.  Feels that his energy has improved because he is eating better.  Was able to go to town and go shopping over the weekend.  His son reports that a public health nurse friend did check in on him his dad and noted a ulceration in the right leg and the need to see podiatrist. (Son lives in [place]).    Symptom Review:  General: Good  Pain: Continues to have upper back pain, overall stable.  Using oxycodone 5 mg usually at least 1-2x a day-occasionally needs 3 times per day when it is raining.  Not using the Motrin.  Fatigue: Energy improved when his appetite improved. Remains independent with ADLs and cooks on his own.    Mobility: No issues  Sleep: Pretty good  Appetite: See interval history  Bowel function: Denies constipation or diarrhea  Dyspnea: No issues  Mood: Describes as good.     Palliative Performance Scale: 80% - Ambulation: Full / Normal Activity with effort, some evidence of disease / Self-Care:Full / Intake: Normal or reduced / Level of Conscious: Full      Coping/Support Issues: Has the support of his spiritual practice.  He had been attending the [employer] on a regular basis prior to  COVID-19.  His most supportive people are his son, who is a Clinical research associate in [place] and his sister who is a Runner, broadcasting/film/video in [place].  He also has a sister  who is a retired Charity fundraiser that lives about 1 hour away.  He does live with his wife since 35.  It is a second marriage for him.    Goals of Care: To feel better    Social History:   Name of primary support: Son, wife and sister  Occupation: Retired.  He had worked in the [employer]  Hobbies: Enjoys visiting family and sitting on the front porch.  Current residence / distance from [facility]: lives in [place].  [place] minutes from [facility].  Shares it is easy to get to the [facility] campus    Advance Care Planning:   HCPOA:  Natural surrogate decision maker: His son would be his primary agent and his wife would be the secondary  Living Will: No  ACP note:   CODE STATUS: Full    Objective     Opioid Risk Tool:    Male  Male    Family history of substance abuse      Alcohol  1  3    Illegal drugs  2  3    Rx drugs  4  4    Personal history of substance abuse      Alcohol  3  3    Illegal drugs  4  4    Rx drugs  5  5    Age between 72???45 years  1  1    History of preadolescent sexual abuse  3  0    Psychological disease      ADD, OCD, bipolar, schizophrenia  2  2    Depression  1  1       Total: 0  (<3 low risk, 4-7 moderate risk, >8 high risk)    Oncology History   Malignant neoplasm of prostate (CMS-HCC)   09/30/2011 Initial Diagnosis    Malignant neoplasm of prostate (CMS-HCC)     11/23/2018 Endocrine/Hormone Therapy    OP LEUPROLIDE (ELIGARD) 45 MG EVERY 6 MONTHS  Plan Provider: Chrisandra Netters, MD     Bone metastases (CMS-HCC)   08/24/2017 Initial Diagnosis    Bone metastases (CMS-HCC)     11/23/2018 Endocrine/Hormone Therapy    OP LEUPROLIDE (ELIGARD) 45 MG EVERY 6 MONTHS  Plan Provider: Chrisandra Netters, MD         Patient Active Problem List   Diagnosis   ??? Malignant neoplasm of prostate (CMS-HCC)   ??? 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 Hypertension   ??? Calculus of kidney   ??? Acute renal failure (CMS-HCC)   ??? Foreign body in bladder and urethra   ??? Uric acid nephrolithiasis   ??? Renal cyst, acquired, left   ??? Pancreatic mass   ??? Acute pancreatitis   ??? Bronchiectasis (CMS-HCC)   ??? Acute kidney injury (CMS-HCC)   ??? Lesion of pancreas   ??? Bone metastases (CMS-HCC)       Past Medical History:   Diagnosis Date   ??? Calculus of kidney    ??? Hypertension    ??? Malignant neoplasm of prostate (CMS-HCC)        Past Surgical History:   Procedure Laterality Date   ??? PR ARTHRODESIS POSTERIOR/POSTERIORLATERAL CERVICAL BELOW C2 Midline 08/05/2017    Procedure: ARTHRODESIS, POSTERIOR OR POSTEROLATERAL TECHNIQUE, SINGLE LEVEL; CERVICAL BELOW C2 SEGMENT;  Surgeon: Timothy Lasso, MD;  Location: [facility];  Service: Ortho Spine   ??? PR ARTHRODESIS POSTERIOR/POSTEROLATERAL EA ADDL Midline 08/05/2017    Procedure: ARTHRODESIS,  POSTERIOR OR POSTEROLATERAL TECHNIQUE, SINGLE LEVEL; EACH ADDITIONAL VERTEBRAL SEGMENT;  Surgeon: Timothy Lasso, MD;  Location: MAIN OR [facility];  Service: Ortho Spine   ??? PR LAMINEC/FACETECT/FORAMIN,CERVICAL 1 SEG Midline 08/05/2017    Procedure: LAMINECTOMY SNGL VERTEBRAL SEGMT-UNI/BIL; CERV;  Surgeon: Timothy Lasso, MD;  Location: MAIN OR [facility];  Service: Ortho Spine   ??? PR LAMINEC/FACETECT/FORAMIN,EACH ADDNL Midline 08/05/2017    Procedure: LAMINECTMY 1 SEGMT-UNI/BIL; EA ADD CERV/THOR/LUM;  Surgeon: Timothy Lasso, MD;  Location: MAIN OR [facility];  Service: Ortho Spine   ??? PR POSTERIOR SEGMENTAL INSTRUMENTATION 7-12 VRT SEG Midline 08/05/2017    Procedure: POSTERIOR SEGMENTAL INSTRUMENTATION; (EG, PEDICLE FIXATION, DUAL RODS W/MULT HOOKS/WIRES) 7-12 VERTEB SEGMT;  Surgeon: Timothy Lasso, MD;  Location: MAIN OR [facility];  Service: Ortho Spine   ??? PR UPGI ENDOSCOPY,FN NEEDLE BX,GUIDED N/A 02/11/2017    Procedure: UGI W/TRANSENDOSCOPIC US-GUIDE INTRA/TRANSMURAL NEEDLE ASP/BX (INCL EXAM ESOPHAGUS, STOMACH, DOUDENUM/JEJ);  Surgeon: Vonda Antigua, MD;  Location: [facility];  Service: Gastroenterology   ??? URETEROSCOPY         Current Outpatient Medications   Medication Sig Dispense Refill   ??? allopurinoL (ZYLOPRIM) 100 MG tablet Take 1 tablet (100 mg total) by mouth daily. 90 tablet 3   ??? amLODIPine (NORVASC) 5 MG tablet Take 1 tablet by mouth daily.     ??? atorvastatin (LIPITOR) 10 MG tablet Take 10 mg by mouth nightly.      ??? carvediloL (COREG) 6.25 MG tablet Take 1 tablet (6.25 mg total) by mouth Two (2) times a day. 60 tablet 11   ??? darolutamide (NUBEQA) 300 mg tablet Take 2 tablets (600 mg total) by mouth 2 (two) times a day with meals. Take with food. Swallow tablets whole. 120 tablet 11   ??? dexAMETHasone (DECADRON) 0.5 MG tablet Take 0.5 mg (1 tab) every morning starting on 5/24 to 5/30 and then only take every other day (on 6/1, 6/3 and last dose on 01/26/20) and then stop. 10 tablet 0   ??? docusate sodium (COLACE) 100 MG capsule Take 100 mg by mouth two (2) times a day as needed for constipation.     ??? famotidine (PEPCID) 20 MG tablet TAKE 1 TABLET(20 MG) BY MOUTH DAILY 90 tablet 3   ??? furosemide (LASIX) 20 MG tablet Take 1 tablet (20 mg total) by mouth daily for 14 days. 14 tablet 0   ??? ibuprofen (MOTRIN) 600 MG tablet Take 600 mg by mouth every eight (8) hours as needed for pain.     ??? megestroL (MEGACE) 400 mg/10 mL (10 mL) Susp oral suspension Take 20 mL (800 mg total) by mouth daily. 600 mL 0   ??? oxyCODONE (ROXICODONE) 5 MG immediate release tablet Take 1 tablet (5 mg total) by mouth every eight (8) hours as needed for pain. 90 tablet 0   ??? polyethylene glycol (MIRALAX) 17 gram packet Take 17 g by mouth two (2) times a day as needed.       No current facility-administered medications for this visit.       Allergies: No Known Allergies    Family History:  Cancer-related family history includes Prostate cancer in an other family member. There is no history of Kidney cancer.  He indicated that the status of his mother is unknown. He indicated that the status of his neg hx is unknown. He indicated that the status of his other is unknown.        Lab Results  Component Value Date    CREATININE 0.89 12/31/2019     Lab Results   Component Value Date    ALKPHOS 67 12/31/2019    BILITOT 1.1 12/31/2019    PROT 6.9 12/31/2019    ALBUMIN 3.5 12/31/2019    ALT 19 12/31/2019    AST 20 12/31/2019       Pam Drown, FNP-BC, [facility]  Outpatient Oncology Palliative Care Service  [facility]  [address] [place], [place] [address]  [phone]      I spent 21 minutes on the phone with the patient. I spent an additional 15 minutes on pre- and post-visit activities.     The patient was physically located in [place] or a state in which I am permitted to provide care. The patient and/or parent/gauardian understood that s/he may incur co-pays and cost sharing, and agreed to the telemedicine visit. The visit was completed via phone and/or video, which was appropriate and reasonable under the circumstances given the patient's presentation at the time.    The patient and/or parent/guardian has been advised of the potential risks and limitations of this mode of treatment (including, but not limited to, the absence of in-person examination) and has agreed to be treated using telemedicine. The patient's/patient's family's questions regarding telemedicine have been answered.     If the phone/video visit was completed in an ambulatory setting, the patient and/or parent/guardian has also been advised to contact their provider???s office for worsening conditions, and seek emergency medical treatment and/or call 911 if the patient deems either necessary.

## 2020-02-12 NOTE — Unmapped (Signed)
Outpatient Sw Note - Telephone Call      Sw called patient's son [name] at request of Burtis Junes to discuss in-home resources for the patient.  [name] would like someone to come to the patient's home to fill his pill box and check in on him once/week.  Sw explained that patient's insurance would not cover this and this cost would be out-of-pocket.  Sw agreed to email [employer] with home health resources in the patient's county.  [name] expressed appreciation for this assistance.    Sw emailed [name] the following websites:    [email or web address]  [email or web address]  [email or web address]    Sherran Needs, MSW, LCSW

## 2020-02-13 NOTE — Unmapped (Signed)
Attempted to schedule call with Stephen Bartlett; no answer and unable to leave message/ one line does not work right now.  atf

## 2020-02-20 NOTE — Unmapped (Signed)
Pt states he has a full bottle.  Will call him back in 2 weeks

## 2020-03-04 NOTE — Unmapped (Signed)
[facility] Refill Coordination Note    Specialty Medication(s) to be Shipped:   Hematology/Oncology: Nubeqa    Other medication(s) to be shipped: n/a     Stephen Bartlett, DOB: 10-03-1940  Phone: [phone] (home)       All above HIPAA information was verified with patient.     Was a Nurse, learning disability used for this call? No    Completed refill call assessment today to schedule patient's medication shipment from the [facility] [phone]).       Specialty medication(s) and dose(s) confirmed: Regimen is correct and unchanged.   Changes to medications: Stephen Bartlett reports no changes at this time.  Changes to insurance: No  Questions for the pharmacist: Yes: Feet swelling up    Confirmed patient received Welcome Packet with first shipment. The patient will receive a drug information handout for each medication shipped and additional FDA Medication Guides as required.       DISEASE/MEDICATION-SPECIFIC INFORMATION        N/A    SPECIALTY MEDICATION ADHERENCE     Medication Adherence    Patient reported X missed doses in the last month: 1  Specialty Medication: Nubeqa 300mg   Patient is on additional specialty medications: No  Informant: patient                Nubeqa 300 mg: 15 days of medicine on hand         SHIPPING     Shipping address confirmed in Epic.     Delivery Scheduled: Yes, Expected medication delivery date: 03/14/20.     Medication will be delivered via Same Day Courier to the prescription address in [employer] [place].    Stephen Bartlett   [facility] Specialty Technician

## 2020-03-05 NOTE — Unmapped (Signed)
[facility] Pharmacist Intervention    Type of intervention: Medication side effect    Medication: Nubeqa 300 mg    Problem: [name] reports swelling in both feet.  Patient has experienced swelling since April.      Intervention: Patient went to ED 12/31/19 for swelling in feet.  He was given furosemide BID and potassium supplements x 14 days and told to contact PCP.  When I spoke with pt today he confirmed he had not seen his PCP yet.  He was advised to contact [facility] [phone]) and make a follow up appointment.  He was also advised to take both the furosemide and potassium bottle with him to the appointment.  He was also counseled to use compression hose.  Patient stated he used them for a while but developed sores.  He was counseled to hand wash the compression socks and let air dry.  Then try to wear them during the day and take of at night while he slept and to sleep with legs elevated.    Follow up needed: will check back with patient at next call    Approximate time spent: 15 minutes    Ellenora Talton [facility] Specialty Pharmacist

## 2020-03-10 ENCOUNTER — Institutional Professional Consult (permissible substitution): Admit: 2020-03-10 | Discharge: 2020-03-11 | Payer: MEDICARE

## 2020-03-10 DIAGNOSIS — R630 Anorexia: Principal | ICD-10-CM

## 2020-03-10 DIAGNOSIS — C7951 Secondary malignant neoplasm of bone: Principal | ICD-10-CM

## 2020-03-10 DIAGNOSIS — G893 Neoplasm related pain (acute) (chronic): Principal | ICD-10-CM

## 2020-03-10 DIAGNOSIS — C61 Malignant neoplasm of prostate: Principal | ICD-10-CM

## 2020-03-10 DIAGNOSIS — Z515 Encounter for palliative care: Principal | ICD-10-CM

## 2020-03-10 MED ORDER — OXYCODONE 5 MG TABLET
ORAL_TABLET | Freq: Three times a day (TID) | ORAL | 0 refills | 30.00000 days | Status: CP | PRN
Start: 2020-03-10 — End: ?

## 2020-03-10 NOTE — Unmapped (Signed)
OUTPATIENT ONCOLOGY PALLIATIVE CARE    Principal Diagnosis: [name] is a 79 y.o. 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     Assessment/Plan:   1.  Upper back pain due to bone mets from prostate cancer-controlled with current regimen and no longer on decadron. Last dose decadron was on 01/24/20. (Trying to only use Decadron long-term for hospice patients).  -Continue oxycodone 5 mg every 8 hours as needed pain.   -Not using ibuprofen.    2.  Anorexia-improved slightly with as needed Megace.  -Continue Megace 20 mL-800 mg daily as needed  -Dietitian unable to connect with patient and she sent out handouts to support appetite.    3.  Chronic leg edema-appears stable for now.  Occasional use of as needed of compression hose and is on Lasix daily.  -Encouraged the follow-up visit with PCP on 7/22.    4. Support-patient sharing that he is able to manage his medications independently by looking at the vials.  Son shared that they interviewed private pay support for medical management, but really did not find a good fit in the community.  MSW did provide information for services.  -NP shared that sometimes pharmacies can do prefilled blister packs and I am not sure if this is a service that is offered in his community.  Will connect with Laurene Footman, CPP to see what options are available.  Shared that there is a small fee for this service.  Patient sharing that he does not feel he needs it but son states that it probably would be good information to have.  -Highly recommended to make sure that patient brings in all his medications for the upcoming visit on 8/12 and will make sure that his medications are reviewed.      5. Advanced care planning-  -no healthcare power of attorney or living will, but son is receptive to having these completed.  Shared that we can help get the documents notarized on the 8/12 visit if they would like.  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  Shared the prepare for your care form with his son via text.  -his healthcare decision-maker is his son Stephen Bartlett.      -At prior visits: NP did discuss that in the state of [place], his wife will be his agent unless he does get this in writing.  -Patient wants his son to be his primary agent, but he does not have advanced care planning documents.  His secondary agent would be his spouse.  -Discussed values and maintaining his independence and his relationship with his son are important to him.  His strengths include his religious faith and is grateful for his family and being able to live independently.      # Controlled substances risk management.   - Patient does not have a signed pain medication agreement with our team, but this was discussed verbally.   - [place] database was reviewed today and it was appropriate.   - Urine drug screen was not performed at this visit. Findings: not applicable.   - Patient has received information about safe storage and administration of medications.   - Patient has not received a prescription for narcan.      F/u:  8/12 visit in clinic with Dr. Genice Rouge and phone visit mid September with Arline Asp.  ----------------------------------------  Referring Provider: Dr. [name]  Oncology Team: Dr. [name]  PCP: [name]      HPI: 79 year old male with castration resistant metastatic prostate  cancer to bone diagnosed in 2013.  In January 2019, patient had cord compression and subsequent surgery & XRT with a rod from C7-T8.  His prior tx have been Radium 223, Avodart/Casodex and abiraterone/Lupron. Restaging scans 3/20 (post-Redium) are stable/slightly improved. He is also being followed for a neuroendocrine tumor grade 1 pancreatic mass.     Patient's primary symptom is his upper back pain which is exacerbated by changes in the weather.  He reports that the pain is currently at 7/10, but he has not taken the a.m. ibuprofen 600 mg and the Decadron 2 mg.  He does find some relief with this.  Has some difficulty using the pain scores.  Feels that he still not getting adequate amount of relief.  He describes the pain as a numbing and aching sensation.  He had tried Tylenol in the past with no relief. Does endorse history of marijuana use.  No other illicit drug use and opioid risk assessment tool was done today.    Patient shared that when he had his spine surgery he could not walk and it took much time and energy for him to regain his strength and walk again. He still drives and is able to do some cooking in the home.  He is independent with his personal ADLs.    ??  Current cancer-directed therapy: ADT- Eligard 45 (6 month injection)  and  Darolutamide         Interval hx, 08/21/19-CK    Patient was hospitalized from 12/2-12/4 for complications from Covid.  He had acute hypoxic respiratory failure with mild diarrhea.  He was sent home with home oxygen and steroids.  Shares that he is hardly using the oxygen now.    Interval hx 03/10/20 CK-phone visit with son Stephen Bartlett and patient    Shares that the right leg open wound is now scab.  No issues in getting his shoes on.  Symptom Review:  General: Pretty good   Pain: Continues to have upper back pain, overall stable.  Using oxycodone 5 mg usually 2 to 3 tablets/day.  Range exacerbates his pain and definitely uses a 3 tablets.  No use of the as needed Motrin.  Fatigue: Fair, still is able to drive and do his own cooking.  Mobility: Using a cane as needed in the home and usually when he leaves the home.  Sleep:  good  Appetite: Okay, uses the Megace a couple times during the week.  Bowel function: Denies constipation or diarrhea.  Bowel movements are daily and has as needed MiraLAX if needed.  Dyspnea: No issues  Mood: Shares sometimes mood is not so good but overall it remains positive.  Shares he takes it as it comes and relies on his spiritual practice.    Palliative Performance Scale: 80% - Ambulation: Full / Normal Activity with effort, some evidence of disease / Self-Care:Full / Intake: Normal or reduced / Level of Conscious: Full      Coping/Support Issues: Has the support of his spiritual practice.  He had been attending the [employer] on a regular basis prior to  COVID-19.  His most supportive people are his son, who is a Clinical research associate in [place] and his sister who is a Runner, broadcasting/film/video in [place].  He also has a sister who is a retired Charity fundraiser that lives about 1 hour away.  He does live with his wife since 40.  It is a second marriage for him.    Goals of Care: To feel better  Social History:   Name of primary support: Son, wife and sister  Occupation: Retired.  He had worked in the [employer]  Hobbies: Enjoys visiting family and sitting on the front porch.  Current residence / distance from [facility]: lives in [place].  45 minutes from [facility].  Shares it is easy to get to the [facility]    Advance Care Planning:   HCPOA:  Natural surrogate decision maker: His son would be his primary agent and his wife would be the secondary  Living Will: No  ACP note:   CODE STATUS: Full    Objective     Opioid Risk Tool:    Male  Male    Family history of substance abuse      Alcohol  1  3    Illegal drugs  2  3    Rx drugs  4  4    Personal history of substance abuse      Alcohol  3  3    Illegal drugs  4  4    Rx drugs  5  5    Age between 55???45 years  1  1    History of preadolescent sexual abuse  3  0    Psychological disease      ADD, OCD, bipolar, schizophrenia  2  2    Depression  1  1       Total: 0  (<3 low risk, 4-7 moderate risk, >8 high risk)    Oncology History   Malignant neoplasm of prostate (CMS-HCC)   09/30/2011 Initial Diagnosis    Malignant neoplasm of prostate (CMS-HCC)     11/23/2018 Endocrine/Hormone Therapy    OP LEUPROLIDE (ELIGARD) 45 MG EVERY 6 MONTHS  Plan Provider: Chrisandra Netters, MD     Bone metastases (CMS-HCC)   08/24/2017 Initial Diagnosis    Bone metastases (CMS-HCC)     11/23/2018 Endocrine/Hormone Therapy    OP LEUPROLIDE (ELIGARD) 45 MG EVERY 6 MONTHS Plan Provider: Chrisandra Netters, MD         Patient Active Problem List   Diagnosis   ??? Malignant neoplasm of prostate (CMS-HCC)   ??? 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 Hypertension   ??? Calculus of kidney   ??? Acute renal failure (CMS-HCC)   ??? Foreign body in bladder and urethra   ??? Uric acid nephrolithiasis   ??? Renal cyst, acquired, left   ??? Pancreatic mass   ??? Acute pancreatitis   ??? Bronchiectasis (CMS-HCC)   ??? Acute kidney injury (CMS-HCC)   ??? Lesion of pancreas   ??? Bone metastases (CMS-HCC)       Past Medical History:   Diagnosis Date   ??? Calculus of kidney    ??? Hypertension    ??? Malignant neoplasm of prostate (CMS-HCC)        Past Surgical History:   Procedure Laterality Date   ??? PR ARTHRODESIS POSTERIOR/POSTERIORLATERAL CERVICAL BELOW C2 Midline 08/05/2017    Procedure: ARTHRODESIS, POSTERIOR OR POSTEROLATERAL TECHNIQUE, SINGLE LEVEL; CERVICAL BELOW C2 SEGMENT;  Surgeon: Timothy Lasso, MD;  Location[facility] MAIN OR [facility];  Service: Ortho Spine   ??? PR ARTHRODESIS POSTERIOR/POSTEROLATERAL EA ADDL Midline 08/05/2017    Procedure: ARTHRODESIS, POSTERIOR OR POSTEROLATERAL TECHNIQUE, SINGLE LEVEL; EACH ADDITIONAL VERTEBRAL SEGMENT;  Surgeon: Timothy Lasso, MD;  Location: MAIN OR [facility];  Service: Ortho Spine   ??? PR LAMINEC/FACETECT/FORAMIN,CERVICAL 1 SEG Midline 08/05/2017    Procedure: LAMINECTOMY SNGL VERTEBRAL  SEGMT-UNI/BIL; CERV;  Surgeon: Timothy Lasso, MD;  Location: MAIN OR [facility];  Service: Ortho Spine   ??? PR LAMINEC/FACETECT/FORAMIN,EACH ADDNL Midline 08/05/2017    Procedure: LAMINECTMY 1 SEGMT-UNI/BIL; EA ADD CERV/THOR/LUM;  Surgeon: Timothy Lasso, MD;  Location: MAIN OR [facility];  Service: Ortho Spine   ??? PR POSTERIOR SEGMENTAL INSTRUMENTATION 7-12 VRT SEG Midline 08/05/2017    Procedure: POSTERIOR SEGMENTAL INSTRUMENTATION; (EG, PEDICLE FIXATION, DUAL RODS W/MULT HOOKS/WIRES) 7-12 VERTEB SEGMT;  Surgeon: Timothy Lasso, MD;  Location: MAIN OR [facility];  Service: Ortho Spine   ??? PR UPGI ENDOSCOPY,FN NEEDLE BX,GUIDED N/A 02/11/2017    Procedure: UGI W/TRANSENDOSCOPIC US-GUIDE INTRA/TRANSMURAL NEEDLE ASP/BX (INCL EXAM ESOPHAGUS, STOMACH, DOUDENUM/JEJ);  Surgeon: Vonda Antigua, MD;  [facility];  Service: Gastroenterology   ??? URETEROSCOPY         Current Outpatient Medications   Medication Sig Dispense Refill   ??? allopurinoL (ZYLOPRIM) 100 MG tablet Take 1 tablet (100 mg total) by mouth daily. 90 tablet 3   ??? amLODIPine (NORVASC) 5 MG tablet Take 1 tablet by mouth daily.     ??? atorvastatin (LIPITOR) 10 MG tablet Take 10 mg by mouth nightly.      ??? carvediloL (COREG) 6.25 MG tablet Take 1 tablet (6.25 mg total) by mouth Two (2) times a day. 60 tablet 11   ??? darolutamide (NUBEQA) 300 mg tablet Take 2 tablets (600 mg total) by mouth 2 (two) times a day with meals. Take with food. Swallow tablets whole. 120 tablet 11   ??? docusate sodium (COLACE) 100 MG capsule Take 100 mg by mouth two (2) times a day as needed for constipation.     ??? furosemide (LASIX) 20 MG tablet Take 1 tablet (20 mg total) by mouth daily for 14 days. 14 tablet 0   ??? ibuprofen (MOTRIN) 600 MG tablet Take 600 mg by mouth every eight (8) hours as needed for pain.     ??? oxyCODONE (ROXICODONE) 5 MG immediate release tablet Take 1 tablet (5 mg total) by mouth every eight (8) hours as needed for pain. 90 tablet 0   ??? polyethylene glycol (MIRALAX) 17 gram packet Take 17 g by mouth two (2) times a day as needed.       No current facility-administered medications for this visit.       Allergies: No Known Allergies    Family History:  Cancer-related family history includes Prostate cancer in an other family member. There is no history of Kidney cancer.  He indicated that the status of his mother is unknown. He indicated that the status of his neg hx is unknown. He indicated that the status of his other is unknown.        Lab Results   Component Value Date    CREATININE 0.89 12/31/2019     Lab Results   Component Value Date    ALKPHOS 67 12/31/2019    BILITOT 1.1 12/31/2019    PROT 6.9 12/31/2019    ALBUMIN 3.5 12/31/2019    ALT 19 12/31/2019    AST 20 12/31/2019       Pam Drown, FNP-BC, [facility]  Outpatient Oncology Palliative Care Service  [facility]  [address] [place], [place] [address]  [phone]      I spent 19 minutes on the phone with the patient. I spent an additional 15 minutes on pre- and post-visit activities.     The patient was physically located in [place] or  a state in which I am permitted to provide care. The patient and/or parent/gauardian understood that s/he may incur co-pays and cost sharing, and agreed to the telemedicine visit. The visit was completed via phone and/or video, which was appropriate and reasonable under the circumstances given the patient's presentation at the time.    The patient and/or parent/guardian has been advised of the potential risks and limitations of this mode of treatment (including, but not limited to, the absence of in-person examination) and has agreed to be treated using telemedicine. The patient's/patient's family's questions regarding telemedicine have been answered.     If the phone/video visit was completed in an ambulatory setting, the patient and/or parent/guardian has also been advised to contact their provider???s office for worsening conditions, and seek emergency medical treatment and/or call 911 if the patient deems either necessary.

## 2020-03-14 MED FILL — NUBEQA 300 MG TABLET: ORAL | 30 days supply | Qty: 120 | Fill #7

## 2020-03-14 MED FILL — NUBEQA 300 MG TABLET: 30 days supply | Qty: 120 | Fill #7 | Status: AC

## 2020-04-03 ENCOUNTER — Ambulatory Visit: Admit: 2020-04-03 | Discharge: 2020-04-03 | Payer: MEDICARE | Attending: Medical Oncology | Primary: Medical Oncology

## 2020-04-03 ENCOUNTER — Institutional Professional Consult (permissible substitution): Admit: 2020-04-03 | Discharge: 2020-04-03 | Payer: MEDICARE

## 2020-04-03 ENCOUNTER — Ambulatory Visit
Admit: 2020-04-03 | Discharge: 2020-04-03 | Payer: MEDICARE | Attending: Student in an Organized Health Care Education/Training Program | Primary: Student in an Organized Health Care Education/Training Program

## 2020-04-03 ENCOUNTER — Ambulatory Visit: Admit: 2020-04-03 | Discharge: 2020-04-03 | Payer: MEDICARE

## 2020-04-03 LAB — COMPREHENSIVE METABOLIC PANEL
ALBUMIN: 3.1 g/dL — ABNORMAL LOW (ref 3.4–5.0)
ALKALINE PHOSPHATASE: 102 U/L (ref 46–116)
ALT (SGPT): 8 U/L — ABNORMAL LOW (ref 10–49)
ANION GAP: 11 mmol/L (ref 5–14)
AST (SGOT): 18 U/L (ref <=34)
BILIRUBIN TOTAL: 0.9 mg/dL (ref 0.3–1.2)
BLOOD UREA NITROGEN: 14 mg/dL (ref 9–23)
BUN / CREAT RATIO: 10
CALCIUM: 9.1 mg/dL (ref 8.7–10.4)
CHLORIDE: 106 mmol/L (ref 98–107)
CO2: 22 mmol/L (ref 20.0–31.0)
CREATININE: 1.37 mg/dL — ABNORMAL HIGH
EGFR CKD-EPI AA MALE: 57 mL/min/{1.73_m2} — ABNORMAL LOW (ref >=60)
EGFR CKD-EPI NON-AA MALE: 49 mL/min/{1.73_m2} — ABNORMAL LOW (ref >=60)
POTASSIUM: 3.7 mmol/L (ref 3.5–5.1)
SODIUM: 139 mmol/L (ref 135–145)

## 2020-04-03 LAB — PROSTATE SPECIFIC ANTIGEN: Prostate specific Ag:MCnc:Pt:Ser/Plas:Qn:: 17.76 — ABNORMAL HIGH

## 2020-04-03 LAB — CBC
HEMATOCRIT: 33.8 % — ABNORMAL LOW (ref 41.0–53.0)
MEAN CORPUSCULAR HEMOGLOBIN CONC: 32 g/dL (ref 31.0–37.0)
MEAN CORPUSCULAR HEMOGLOBIN: 30.6 pg (ref 26.0–34.0)
MEAN CORPUSCULAR VOLUME: 95.6 fL (ref 80.0–100.0)
MEAN PLATELET VOLUME: 8.7 fL (ref 7.0–10.0)
RED BLOOD CELL COUNT: 3.54 10*12/L — ABNORMAL LOW (ref 4.50–5.90)
RED CELL DISTRIBUTION WIDTH: 15.8 % — ABNORMAL HIGH (ref 12.0–15.0)
WBC ADJUSTED: 4.9 10*9/L (ref 4.5–11.0)

## 2020-04-03 LAB — BUN / CREAT RATIO: Urea nitrogen/Creatinine:MRto:Pt:Ser/Plas:Qn:: 10

## 2020-04-03 LAB — HEMATOCRIT: Hematocrit:VFr:Pt:Bld:Qn:: 33.8 — ABNORMAL LOW

## 2020-04-03 LAB — TESTOSTERONE TOTAL: Testosterone:MCnc:Pt:Ser/Plas:Qn:: <7 — ABNORMAL LOW

## 2020-04-04 NOTE — Unmapped (Signed)
[facility] Refill Coordination Note    Specialty Medication(s) to be Shipped:   Hematology/Oncology: Nubeqa    Other medication(s) to be shipped: No additional medications requested for fill at this time     Stephen Bartlett, DOB: [birth year]  Phone: [phone] (home)       All above HIPAA information was verified with patient.     Was a Nurse, learning disability used for this call? No    Completed refill call assessment today to schedule patient's medication shipment from the [facility] [phone]).       Specialty medication(s) and dose(s) confirmed: Regimen is correct and unchanged.   Changes to medications: Stephen Bartlett reports no changes at this time.  Changes to insurance: No  Questions for the pharmacist: No    Confirmed patient received Welcome Packet with first shipment. The patient will receive a drug information handout for each medication shipped and additional FDA Medication Guides as required.       DISEASE/MEDICATION-SPECIFIC INFORMATION        N/A    SPECIALTY MEDICATION ADHERENCE     Medication Adherence    Patient reported X missed doses in the last month: 0  Specialty Medication: Nubeqa 300mg   Patient is on additional specialty medications: No  Informant: patient                Nubeqa 300 mg: 14 days of medicine on hand         SHIPPING     Shipping address confirmed in Epic.     Delivery Scheduled: Yes, Expected medication delivery date: 04/15/20.     Medication will be delivered via Next Day Courier to the prescription address in Epic [place].    Stephen Bartlett   [facility] Specialty Technician

## 2020-04-04 NOTE — Unmapped (Signed)
Downtime note: Pt tolerated Eligard 45mg  injection to RUA by Haskell Flirt LPN without difficulty. PT left [facility] ambulatory,steady gait, NAD, no questions, complaints, nor concerns voiced at d/c.   Downtime note: Blood drawn using butterfly needle per Dr [name]. Pt tolerated without difficulty. Blood work tubed to lab with downtime slip.

## 2020-04-06 DIAGNOSIS — G893 Neoplasm related pain (acute) (chronic): Principal | ICD-10-CM

## 2020-04-06 MED ORDER — DEXAMETHASONE 1 MG TABLET
ORAL_TABLET | 0 refills | 0 days
Start: 2020-04-06 — End: ?

## 2020-04-07 DIAGNOSIS — C7951 Secondary malignant neoplasm of bone: Principal | ICD-10-CM

## 2020-04-10 ENCOUNTER — Telehealth: Admit: 2020-04-10 | Discharge: 2020-04-11 | Payer: MEDICARE | Attending: Medical Oncology | Primary: Medical Oncology

## 2020-04-10 DIAGNOSIS — C7951 Secondary malignant neoplasm of bone: Principal | ICD-10-CM

## 2020-04-10 NOTE — Unmapped (Signed)
Nice seeing you today.   If you have any questions please call the Nurse Navigator in the office at [phone], or [phone].  Frederic Jericho, MD

## 2020-04-10 NOTE — Unmapped (Signed)
Review: History, Meds, Allergies

## 2020-04-10 NOTE — Unmapped (Signed)
Medical Oncology: Prostate Cancer         I spent 15 minutes on the real-time audio and video with the patient on the date of service. I spent an additional 17 minutes on pre- and post-visit activities on the date of service.     The patient was physically located in [place] or a state in which I am permitted to provide care. The patient and/or parent/guardian understood that s/he may incur co-pays and cost sharing, and agreed to the telemedicine visit. The visit was reasonable and appropriate under the circumstances given the patient's presentation at the time.    The patient and/or parent/guardian has been advised of the potential risks and limitations of this mode of treatment (including, but not limited to, the absence of in-person examination) and has agreed to be treated using telemedicine. The patient's/patient's family's questions regarding telemedicine have been answered.     If the visit was completed in an ambulatory setting, the patient and/or parent/guardian has also been advised to contact their provider???s office for worsening conditions, and seek emergency medical treatment and/or call 911 if the patient deems either necessary.      Assessment:  Castration resistant metastatic prostate cancer to bone, s/p treatment with abiraterone/Lupron (experienced cord compression and discontinued 1/19, and underwent neurosurgery), and radium-223 (6/19 - 11/19).  Also has neuroendocrine tumor grade 1 pancreatic mass.  Restaging scans 06/2019 and 10/2018 were stable post-Radium.  He started darolutamide (held briefly during a hospitalization for COVID-19 in late 2020).  PSA was 31 in 08/2018 to 60 in 05/2019, then 51.5 on 06/28/2019 and 45.9 on 09/13/19 and 34.5 on 11/22/19 and down to 17.76 on 04/03/20 with castrate testosterone levels.      He has had challenges with organizing medications at home.  He lives with his wife who works and is not able to help with this, and his son lives in [place] but is becoming more involved.    He is followed by Palliative Care for back pain in his back at the area of prior surgery where he has a rod.  He is on Oxycodone.  He has few treatment options, and he understands the goals of care are supportive and palliative.  He has done advanced care planning with Palliative Care.    Plan:  - Continue ADT; Eligard every 43-months (05/16/2020).  - Continue darolutamide.  - Follow PSA.  - Blood pressure control with Katina Degree, PharmD.  - Followed by Palliative Care for pain control, thank you.  - Prior increased frequency of urination:  Better since stopping tamsulsoin. PVR has been low.  - LEE, improved with diuretic with his PCP -- he will follow up with PCP about this.  - Pain: Steroids have helped, ibuprofen has not. Continue dexamethasone.  - HTN  - Follow PSA.  - LEE: Diuretic as needed with his PCP and Palliative Care.  - Code status: discuss next visit    - Advanced care planning: has been done with Palliative Care.    Follow up with Katina Degree PharmD about medications and blood pressure.  Follow up with me in 3 months for assessment and labs, then 6 months for Eligard.    ---------------------------------------------------------    HPI:  - Prior patient of Dr. Verl Bangs  - 2013: Presented with metastatic prostate cancer to bone, PSA ~900, started Lupron, then Avodart/Casodex, then abiraterone/Lupron  - 1/19: Experienced cord compression while on Lupron/abiraterone --> RT  - Neuroendocrine tumor grade 1 pancreatic mass will be observed.  -  08/11/17: Bone scan: c/w 5/18, stable metastatic disease in the thoracic spine and right hemipelvis.  Redemonstration of radiotracer uptake in the upper thoracic vertebral body. Slightly decreased uptake within the right hemipelvis, consistent with previously seen mixed sclerotic/lytic lesions. No new areas of radiotracer uptake. Physiologic uptake is seen in the kidneys and bladder.  - 09/08/17: CT A/P: 1. Interval improvement in previously visualized pancreatic tail stranding and free fluid with lobulated intermediate density peripancreatic collection along the pancreatic tail, closely abutting the anterior aspect of the spleen. Findings may represent complex pseudocyst in the setting of prior pancreatitis. MRI MRCP is recommended for further evaluation as known neuroendocrine pancreatic tumor is not well delineated on this portal venous phase CT and to exclude postcontrast enhancement of the suspected complex pancreatic pseudocyst along the tail.  2. Right lower lobe bronchiectasis with mucous plugging.  3. Hepatic hypodensities, unchanged and favored to represent hepatic cysts. This may be also be confirmed by MRI.  2/19: Discontinued abiraterone  6-11/19: Radium-223  11/02/18: CT A/P: No new lytic or blastic osseous lesions. Similar appearance of mixed sclerotic and lytic lesion in the right hemipelvis.  11/02/18: Bone Scan: Slightly decrease in radiotracer uptake involving the thoracic spine and right hemipelvis when compared to prior imaging.  05/31/19: PSA 60.4  06/25/19: CT A/P: Similar appearance of mixed sclerotic and lytic lesions in the right hemipelvis. No new lytic or blastic lesions.  Similar appearance of hyperenhancing pancreatic body mass, compatible with pancreatic neuroendocrine tumor.  Decreased size of fluid collection adjacent to the pancreatic tail/inferior margin of the spleen.  Bronchiectasis with mucous plugging in the right lower lobe, similar to prior.  06/25/19: Similar focal increase in radiotracer uptake within the right hemipelvis when compared to prior bone scan from 11/02/2018. Stable focal uptake in the upper thoracic vertebral bodies.    ROS: Feeling well today. Pain is stable, no new pain. Urinary frequency better since stopping tamsulosin.    Current Outpatient Medications on File Prior to Visit   Medication Sig Dispense Refill   ??? allopurinoL (ZYLOPRIM) 100 MG tablet Take 1 tablet (100 mg total) by mouth daily. 90 tablet 3   ??? amLODIPine (NORVASC) 5 MG tablet Take 1 tablet by mouth daily.     ??? atorvastatin (LIPITOR) 10 MG tablet Take 10 mg by mouth nightly.      ??? carvediloL (COREG) 6.25 MG tablet Take 1 tablet (6.25 mg total) by mouth Two (2) times a day. 60 tablet 11   ??? darolutamide (NUBEQA) 300 mg tablet Take 2 tablets (600 mg total) by mouth 2 (two) times a day with meals. Take with food. Swallow tablets whole. 120 tablet 11   ??? docusate sodium (COLACE) 100 MG capsule Take 100 mg by mouth two (2) times a day as needed for constipation.     ??? furosemide (LASIX) 20 MG tablet Take 1 tablet (20 mg total) by mouth daily for 14 days. 14 tablet 0   ??? ibuprofen (MOTRIN) 600 MG tablet Take 600 mg by mouth every eight (8) hours as needed for pain.     ??? oxyCODONE (ROXICODONE) 5 MG immediate release tablet Take 1 tablet (5 mg total) by mouth every eight (8) hours as needed for pain. 90 tablet 0   ??? polyethylene glycol (MIRALAX) 17 gram packet Take 17 g by mouth two (2) times a day as needed.       Current Facility-Administered Medications on File Prior to Visit   Medication Dose Route Frequency Provider Last  Rate Last Admin   ??? leuprolide (6 month) (ELIGARD) 45 mg injection                PE:  There were no vitals taken for this visit.  NAD  A+Ox3  No rash  Abd ND    Data:    PSA   Date Value Ref Range Status   04/03/2020 17.76 (H) 0.00 - 4.00 ng/mL Final   11/22/2019 34.50 (H) 0.00 - 4.00 ng/mL Final   09/13/2019 45.90 (H) 0.00 - 4.00 ng/mL Final   06/28/2019 51.50 (H) 0.00 - 4.00 ng/mL Final   05/31/2019 60.40 (H) 0.00 - 4.00 ng/mL Final   08/31/2018 31.30 (H) 0.00 - 4.00 ng/mL Final   07/06/2018 35.00 (H) 0.00 - 4.00 ng/mL Final   06/01/2018 33.10 (H) 0.00 - 4.00 ng/mL Final   05/04/2018 42.00 (H) 0.00 - 4.00 ng/mL Final   03/30/2018 42.70 (H) 0.00 - 4.00 ng/mL Final   01/26/2018 46.20 (H) 0.00 - 4.00 ng/mL Final   08/25/2017 61.50 (H) 0.00 - 4.00 ng/mL Final   08/08/2017 80.90 (H) 0.00 - 4.00 ng/mL Final   05/19/2017 34.70 (H) 0.00 - 4.00 ng/mL Final   02/10/2017 30.50 (H) 0.00 - 4.00 ng/mL Final   01/20/2017 19.60 (H) 0.00 - 4.00 ng/mL Final   01/07/2017 18.70 (H) 0.00 - 4.00 ng/mL Final   12/22/2016 19.40 (H) 0.00 - 4.00 ng/mL Final   12/03/2016 31.70 (H) 0.00 - 4.00 ng/mL Final   11/04/2016 30.30 (H) 0.00 - 4.00 ng/mL Final   08/06/2016 18.80 (H) 0.00 - 4.00 ng/mL Final   05/06/2016 24.10 (H) 0.00 - 4.00 ng/mL Final   01/29/2016 12.50 (H) 0.00 - 4.00 ng/mL Final   10/30/2015 11.10 (H) 0.00 - 4.00 ng/mL Final   07/24/2015 7.41 (H) 0.00 - 4.00 ng/mL Final   04/03/2015 3.44 0.00 - 4.00 ng/mL Final   12/23/2014 12.50 (H) 0.00 - 4.00 ng/mL Final

## 2020-04-14 MED FILL — NUBEQA 300 MG TABLET: 30 days supply | Qty: 120 | Fill #8 | Status: AC

## 2020-04-14 MED FILL — NUBEQA 300 MG TABLET: ORAL | 30 days supply | Qty: 120 | Fill #8

## 2020-04-15 NOTE — Unmapped (Signed)
Spoke with the pt to coordinate his clinic appt; sending him an appointment letter as a reminder  atf

## 2020-04-23 MED ORDER — FAMOTIDINE 20 MG TABLET
ORAL_TABLET | 3 refills | 0 days
Start: 2020-04-23 — End: ?

## 2020-05-01 NOTE — Unmapped (Signed)
Pt states he has more than a month left of medication.  Would like a call back in 3 weeks

## 2020-05-07 ENCOUNTER — Institutional Professional Consult (permissible substitution): Admit: 2020-05-07 | Discharge: 2020-05-08 | Payer: MEDICARE

## 2020-05-07 DIAGNOSIS — C61 Malignant neoplasm of prostate: Principal | ICD-10-CM

## 2020-05-07 DIAGNOSIS — G893 Neoplasm related pain (acute) (chronic): Principal | ICD-10-CM

## 2020-05-07 DIAGNOSIS — C7951 Secondary malignant neoplasm of bone: Principal | ICD-10-CM

## 2020-05-07 DIAGNOSIS — Z515 Encounter for palliative care: Principal | ICD-10-CM

## 2020-05-07 MED ORDER — OXYCODONE 5 MG TABLET
ORAL_TABLET | Freq: Three times a day (TID) | ORAL | 0 refills | 30 days | Status: CP | PRN
Start: 2020-05-07 — End: ?

## 2020-05-07 NOTE — Unmapped (Signed)
OUTPATIENT ONCOLOGY PALLIATIVE CARE    Principal Diagnosis: [name] is a 79 y.o. 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     Assessment/Plan:   1.  Upper back pain due to bone mets from prostate cancer-controlled with current regimen and no longer on decadron. Last dose decadron was on 01/24/20. (Trying to only use Decadron long-term for hospice patients).  -Continue oxycodone 5 mg every 8 hours as needed pain.   -Discontinue ibuprofen.  Last set of labs showed a mild elevation of creatinine.  Has not been taking ibuprofen in the last several months.  Discussed with patient rationale for stopping ibuprofen.    2.  Anorexia-improved without the use of either Decadron or Megace.  -Continue to monitor    3.  Support-planning on getting Covid vaccine at [facility] this week.  Did have Covid earlier this year and recovered.  -Highly encouraged Covid vaccine.    4. Advanced care planning-  -still with no healthcare power of attorney or living will.  Son will be accompanying Stephen Bartlett on the 11/23 visit and will readdress at that time as well.  -At prior visits: Son-Stephen Bartlett is receptive to having these completed.  [name] is a Clinical research associate.  Shared the prepare for your care form with his son via text.  -his healthcare decision-maker is his son Stephen Bartlett.  -NP did discuss that in the state of [place], his wife will be his agent unless he does get this in writing.  -Patient wants his son to be his primary agent, but he does not have advanced care planning documents.  His secondary agent would be his spouse.  -Discussed values and maintaining his independence and his relationship with his son are important to him.  His strengths include his religious faith and is grateful for his family and being able to live independently.      # Controlled substances risk management.   - Patient does not have a signed pain medication agreement with our team, but this was discussed verbally.   - [place] database was reviewed today and it was appropriate.   - Urine drug screen was not performed at this visit. Findings: not applicable.   - Patient has received information about safe storage and administration of medications.   - Patient has not received a prescription for narcan.      F/u: Phone visit for the second or third week in October and on 11/23 in clinic after Dr. Ainsley Spinner visit.  ----------------------------------------  Referring Provider: Dr. [name]  Oncology Team: Dr. 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  In January 2019, patient had cord compression and subsequent surgery & XRT with a rod from C7-T8.  His prior tx have been Radium 223, Avodart/Casodex and abiraterone/Lupron. Restaging scans 3/20 (post-Redium) are stable/slightly improved. He is also being followed for a neuroendocrine tumor grade 1 pancreatic mass.     Patient's primary symptom is his upper back pain which is exacerbated by changes in the weather.  He reports that the pain is currently at 7/10, but he has not taken the a.m. ibuprofen 600 mg and the Decadron 2 mg.  He does find some relief with this.  Has some difficulty using the pain scores.  Feels that he still not getting adequate amount of relief.  He describes the pain as a numbing and aching sensation.  He had tried Tylenol in the  past with no relief. Does endorse history of marijuana use.  No other illicit drug use and opioid risk assessment tool was done today.    Patient shared that when he had his spine surgery he could not walk and it took much time and energy for him to regain his strength and walk again. He still drives and is able to do some cooking in the home.  He is independent with his personal ADLs.    ??  Current cancer-directed therapy: ADT- Eligard 45 (6 month injection)  and  Darolutamide         Interval hx, 08/21/19-CK    Patient was hospitalized from 12/2-12/4 for complications from Covid.  He had acute hypoxic respiratory failure with mild diarrhea.  He was sent home with home oxygen and steroids.  Shares that he is hardly using the oxygen now.    Interval hx 05/07/2020 phone visit with patient and CK    Son sharing does not feel that his son needs to accompany the call today.  Have done joint visits with his son on a three-way call in the past.  Shares that his son has been busy with his work in [place].      Symptom Review:  General: Pretty good   Pain: No change to the upper back pain, overall stable.  Using oxycodone 5 mg usually 2 to 3 tablets/day.  Range exacerbates his pain and definitely uses a 3 tablets.  No use of the as needed Motrin.  Fatigue: Fair, still is able to drive and do his own cooking.  Remains active.  Visits with his sister often.  Mobility: Using a 4 pronged cane as needed in the home and usually when he leaves the home.  Sleep:  good  Appetite: Improved without Decadron or Megace  Bowel function: Denies constipation or diarrhea.  Bowel movements are daily and has as needed MiraLAX if needed.  Dyspnea: No issues  Mood: Overall is okay.  Shares again how he derives so much support from his spiritual practice.      Palliative Performance Scale: 80% - Ambulation: Full / Normal Activity with effort, some evidence of disease / Self-Care:Full / Intake: Normal or reduced / Level of Conscious: Full      Coping/Support Issues: Has the support of his spiritual practice.  He had been attending the [employer] on a regular basis prior to  COVID-19.  His most supportive people are his son, who is a Clinical research associate in [place] and his sister who is a Runner, broadcasting/film/video in [place].  He also has a sister who is a retired Charity fundraiser that lives about 1 hour away.  He does live with his wife since 43.  It is a second marriage for him.    Goals of Care: To feel better    Social History:   Name of primary support: Son, wife and sister  Occupation: Retired.  He had worked in the [employer]  Hobbies: Enjoys visiting family and sitting on the front porch.  Current residence / distance from [facility]: lives in [place].  45 minutes from [facility].  Shares it is easy to get to the [facility]    Advance Care Planning:   HCPOA:  Natural surrogate decision maker: His son would be his primary agent and his wife would be the secondary  Living Will: No  ACP note:   CODE STATUS: Full    Objective     Opioid Risk Tool:    Male  Male    Family history of substance abuse      Alcohol  1  3    Illegal drugs  2  3    Rx drugs  4  4    Personal history of substance abuse      Alcohol  3  3    Illegal drugs  4  4    Rx drugs  5  5    Age between 84???45 years  1  1    History of preadolescent sexual abuse  3  0    Psychological disease      ADD, OCD, bipolar, schizophrenia  2  2    Depression  1  1       Total: 0  (<3 low risk, 4-7 moderate risk, >8 high risk)    Oncology History   Malignant neoplasm of prostate (CMS-HCC)   09/30/2011 Initial Diagnosis    Malignant neoplasm of prostate (CMS-HCC)     11/23/2018 Endocrine/Hormone Therapy    OP LEUPROLIDE (ELIGARD) 45 MG EVERY 6 MONTHS  Plan Provider: Chrisandra Netters, MD     Bone metastases (CMS-HCC)   08/24/2017 Initial Diagnosis    Bone metastases (CMS-HCC)     11/23/2018 Endocrine/Hormone Therapy    OP LEUPROLIDE (ELIGARD) 45 MG EVERY 6 MONTHS  Plan Provider: Chrisandra Netters, MD         Patient Active Problem List   Diagnosis   ??? Malignant neoplasm of prostate (CMS-HCC)   ??? 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 Hypertension   ??? Calculus of kidney   ??? Acute renal failure (CMS-HCC)   ??? Foreign body in bladder and urethra   ??? Uric acid nephrolithiasis   ??? Renal cyst, acquired, left   ??? Pancreatic mass   ??? Acute pancreatitis   ??? Bronchiectasis (CMS-HCC)   ??? Acute kidney injury (CMS-HCC)   ??? Lesion of pancreas   ??? Bone metastases (CMS-HCC)       Past Medical History:   Diagnosis Date   ??? Calculus of kidney    ??? Hypertension    ??? Malignant neoplasm of prostate (CMS-HCC)        Past Surgical History:   Procedure Laterality Date   ??? PR ARTHRODESIS POSTERIOR/POSTERIORLATERAL CERVICAL BELOW C2 Midline 08/05/2017    Procedure: ARTHRODESIS, POSTERIOR OR POSTEROLATERAL TECHNIQUE, SINGLE LEVEL; CERVICAL BELOW C2 SEGMENT;  Surgeon: Timothy Lasso, MD;  Location: MAIN OR [facility];  Service: Ortho Spine   ??? PR ARTHRODESIS POSTERIOR/POSTEROLATERAL EA ADDL Midline 08/05/2017    Procedure: ARTHRODESIS, POSTERIOR OR POSTEROLATERAL TECHNIQUE, SINGLE LEVEL; EACH ADDITIONAL VERTEBRAL SEGMENT;  Surgeon: Timothy Lasso, MD;  Location: MAIN OR [facility];  Service: Ortho Spine   ??? PR LAMINEC/FACETECT/FORAMIN,CERVICAL 1 SEG Midline 08/05/2017    Procedure: LAMINECTOMY SNGL VERTEBRAL SEGMT-UNI/BIL; CERV;  Surgeon: Timothy Lasso, MD;  Location: MAIN OR [facility];  Service: Ortho Spine   ??? PR LAMINEC/FACETECT/FORAMIN,EACH ADDNL Midline 08/05/2017    Procedure: LAMINECTMY 1 SEGMT-UNI/BIL; EA ADD CERV/THOR/LUM;  Surgeon: Timothy Lasso, MD;  Location: MAIN OR [facility];  Service: Ortho Spine   ??? PR POSTERIOR SEGMENTAL INSTRUMENTATION 7-12 VRT SEG Midline 08/05/2017    Procedure: POSTERIOR SEGMENTAL INSTRUMENTATION; (EG, PEDICLE FIXATION, DUAL RODS W/MULT HOOKS/WIRES) 7-12 VERTEB SEGMT;  Surgeon: Timothy Lasso, MD;  Location: MAIN OR [facility];  Service: Ortho Spine   ??? PR UPGI ENDOSCOPY,FN NEEDLE BX,GUIDED N/A 02/11/2017    Procedure: UGI W/TRANSENDOSCOPIC US-GUIDE INTRA/TRANSMURAL NEEDLE ASP/BX (INCL EXAM ESOPHAGUS, STOMACH,  DOUDENUM/JEJ);  Surgeon: Vonda Antigua, MD;  [facility];  Service: Gastroenterology   ??? URETEROSCOPY         Current Outpatient Medications   Medication Sig Dispense Refill   ??? allopurinoL (ZYLOPRIM) 100 MG tablet Take 1 tablet (100 mg total) by mouth daily. 90 tablet 3   ??? amLODIPine (NORVASC) 5 MG tablet Take 1 tablet by mouth daily.     ??? atorvastatin (LIPITOR) 10 MG tablet Take 10 mg by mouth nightly.      ??? carvediloL (COREG) 6.25 MG tablet Take 1 tablet (6.25 mg total) by mouth Two (2) times a day. 60 tablet 11   ??? darolutamide (NUBEQA) 300 mg tablet Take 2 tablets (600 mg total) by mouth 2 (two) times a day with meals. Take with food. Swallow tablets whole. 120 tablet 11   ??? docusate sodium (COLACE) 100 MG capsule Take 100 mg by mouth two (2) times a day as needed for constipation.     ??? furosemide (LASIX) 20 MG tablet Take 1 tablet (20 mg total) by mouth daily for 14 days. 14 tablet 0   ??? ibuprofen (MOTRIN) 600 MG tablet Take 600 mg by mouth every eight (8) hours as needed for pain.     ??? oxyCODONE (ROXICODONE) 5 MG immediate release tablet Take 1 tablet (5 mg total) by mouth every eight (8) hours as needed for pain. 90 tablet 0   ??? polyethylene glycol (MIRALAX) 17 gram packet Take 17 g by mouth two (2) times a day as needed.       No current facility-administered medications for this visit.     Facility-Administered Medications Ordered in Other Visits   Medication Dose Route Frequency Provider Last Rate Last Admin   ??? leuprolide (6 month) (ELIGARD) 45 mg injection                Allergies: No Known Allergies    Family History:  Cancer-related family history includes Prostate cancer in an other family member. There is no history of Kidney cancer.  He indicated that the status of his mother is unknown. He indicated that the status of his neg hx is unknown. He indicated that the status of his other is unknown.        Lab Results   Component Value Date    CREATININE 1.37 (H) 04/03/2020     Lab Results   Component Value Date    ALKPHOS 102 04/03/2020    BILITOT 0.9 04/03/2020    PROT 6.9 04/03/2020    ALBUMIN 3.1 (L) 04/03/2020    ALT 8 (L) 04/03/2020    AST 18 04/03/2020       Pam Drown, FNP-BC, [facility]  Outpatient Oncology Palliative Care Service  [facility]  [address] [place], [place] [address]  [phone]      I spent 11 minutes on the phone with the patient. I spent an additional 10 minutes on pre- and post-visit activities. The patient was physically located in [place] or a state in which I am permitted to provide care. The patient and/or parent/gauardian understood that s/he may incur co-pays and cost sharing, and agreed to the telemedicine visit. The visit was completed via phone and/or video, which was appropriate and reasonable under the circumstances given the patient's presentation at the time.    The patient and/or parent/guardian has been advised of the potential risks and limitations of this mode of treatment (including, but not limited to, the  absence of in-person examination) and has agreed to be treated using telemedicine. The patient's/patient's family's questions regarding telemedicine have been answered.     If the phone/video visit was completed in an ambulatory setting, the patient and/or parent/guardian has also been advised to contact their provider???s office for worsening conditions, and seek emergency medical treatment and/or call 911 if the patient deems either necessary.

## 2020-05-21 NOTE — Unmapped (Signed)
Pt states he has 1 month left of medication

## 2020-05-23 NOTE — Unmapped (Signed)
Returned call to Dr. Marvis Moeller office. Let them know per note on 05-07-20 from palliative care NP she recommended pt receive covid vaccine.

## 2020-05-23 NOTE — Unmapped (Signed)
Hi,     Dr Marvis Moeller office with [facility] contacted the Communication Center requesting to speak with the care team of Stephen Bartlett to discuss:    Dr Marvis Moeller would like to know if it's ok for patient to get the Covid-19 vaccination shot.    Please contact Dr Marvis Moeller at [phone].    Check Indicates criteria has been reviewed and confirmed with the patient:    [x]  Preferred Name   [x]  DOB and/or MR#  [x]  Preferred Contact Method  [x]  Phone Number(s)   []  MyChart     Thank you,   Christell Faith  [facility]   [phone]

## 2020-06-02 NOTE — Unmapped (Signed)
Stephen Bartlett is a 79 yo M with metastatic CRPC on treatment with daralutamide and Eligard. I spoke with Stephen Bartlett to assess his medication adherence to daralutamide.    Per patient he takes daralutamide 2 tablets (600 mg) twice a day with meals (4 tablets total per day). He reports that his son helps him with his medications but that he also uses alarm on his phone to help remember to take his medications. However, he also states that his son lives in [place], [place]. He reports no missed doses and was insistent that he takes his medications as prescribed with no issues.    Of note, his last 30-day supply was delivered on 8/23 so he should have been due for a refill 2 weeks ago.    F/U:  - Medication adherence in clinic on 11/23    Adela Lank Dela Pe??a, PharmD  PGY-2 Oncology Pharmacy Resident

## 2020-06-05 DIAGNOSIS — G893 Neoplasm related pain (acute) (chronic): Principal | ICD-10-CM

## 2020-06-09 MED ORDER — DEXAMETHASONE 2 MG TABLET
ORAL_TABLET | 0 refills | 0 days
Start: 2020-06-09 — End: ?

## 2020-06-11 NOTE — Unmapped (Signed)
[facility] Clinical Assessment & Refill Coordination Note    Stephen Bartlett, DOB: [birth year]  Phone: [phone] (home)     All above HIPAA information was verified with patient.     Was a Nurse, learning disability used for this call? No    Specialty Medication(s):   Hematology/Oncology: Nubeqa     Current Outpatient Medications   Medication Sig Dispense Refill   ??? allopurinoL (ZYLOPRIM) 100 MG tablet Take 1 tablet (100 mg total) by mouth daily. 90 tablet 3   ??? amLODIPine (NORVASC) 5 MG tablet Take 1 tablet by mouth daily.     ??? atorvastatin (LIPITOR) 10 MG tablet Take 10 mg by mouth nightly.      ??? carvediloL (COREG) 6.25 MG tablet Take 1 tablet (6.25 mg total) by mouth Two (2) times a day. 60 tablet 11   ??? darolutamide (NUBEQA) 300 mg tablet Take 2 tablets (600 mg total) by mouth 2 (two) times a day with meals. Take with food. Swallow tablets whole. 120 tablet 11   ??? docusate sodium (COLACE) 100 MG capsule Take 100 mg by mouth two (2) times a day as needed for constipation.     ??? furosemide (LASIX) 20 MG tablet Take 1 tablet (20 mg total) by mouth daily for 14 days. 14 tablet 0   ??? oxyCODONE (ROXICODONE) 5 MG immediate release tablet Take 1 tablet (5 mg total) by mouth every eight (8) hours as needed for pain. 90 tablet 0   ??? polyethylene glycol (MIRALAX) 17 gram packet Take 17 g by mouth two (2) times a day as needed.       No current facility-administered medications for this visit.     Facility-Administered Medications Ordered in Other Visits   Medication Dose Route Frequency Provider Last Rate Last Admin   ??? leuprolide (6 month) (ELIGARD) 45 mg injection                 Changes to medications: Stephen Bartlett reports no changes at this time.    No Known Allergies    Changes to allergies: No    SPECIALTY MEDICATION ADHERENCE     Nubeqa 200 mg: 45 days of medicine on hand     Medication Adherence    Patient reported X missed doses in the last month: 8  Specialty Medication: Nubeqa 300 mg - 2 tabs BID  Patient is on additional specialty medications: No  Informant: patient  Confirmed plan for next specialty medication refill: delivery by pharmacy          Specialty medication(s) dose(s) confirmed: Regimen is correct and unchanged.     Are there any concerns with adherence? Yes: Patient consistently missing doses.  Reports  missing 1-2 doses weekly.  Last refill from [facility] 04/14/20 for 30 day supply. Still reports having 1 full bottle (#120 tablets) plus some in another bottle.    Adherence counseling provided? Yes: patient reports son reminds him to take meds.  Advised to also use reminder on phone or use a designated pill box for AM/PM dosing.  Re-educated on importance of taking medication exactly as prescribed for best results.  Clinic is aware of non-compliance and has reached out several times to counsel on benefits of adherence.    CLINICAL MANAGEMENT AND INTERVENTION      Clinical Benefit Assessment:    Do you feel the medicine is effective or helping your condition? Yes    Clinical Benefit counseling provided? Not needed    Adverse Effects Assessment:  Are you experiencing any side effects? No    Are you experiencing difficulty administering your medicine? No    Quality of Life Assessment:    How many days over the past month did your Malignant neoplasm of prostate  keep you from your normal activities? For example, brushing your teeth or getting up in the morning. Patient declined to answer    Have you discussed this with your provider? Not needed    Therapy Appropriateness:    Is therapy appropriate? Yes, therapy is appropriate and should be continued    DISEASE/MEDICATION-SPECIFIC INFORMATION      N/A    PATIENT SPECIFIC NEEDS     - Does the patient have any physical, cognitive, or cultural barriers? No    - Is the patient high risk? Yes, patient is taking oral chemotherapy. Appropriateness of therapy as been assessed    - Does the patient require a Care Management Plan? No     - Does the patient require physician intervention or other additional services (i.e. nutrition, smoking cessation, social work)? No      SHIPPING     Specialty Medication(s) to be Shipped:   Hematology/Oncology: Nubeqa    Other medication(s) to be shipped: No additional medications requested for fill at this time     Changes to insurance: No    Delivery Scheduled: Patient declined refill at this time due to has 1 full bottle plus more in another bottle.     Medication will be delivered via NA to the confirmed prescription address in [facility].    The patient will receive a drug information handout for each medication shipped and additional FDA Medication Guides as required.  Verified that patient has previously received a Conservation officer, historic buildings.    All of the patient's questions and concerns have been addressed.    Stephen Bartlett [facility] Specialty Pharmacist

## 2020-06-16 ENCOUNTER — Institutional Professional Consult (permissible substitution): Admit: 2020-06-16 | Payer: MEDICARE

## 2020-06-16 NOTE — Unmapped (Unsigned)
OUTPATIENT ONCOLOGY PALLIATIVE CARE    Principal Diagnosis: [name] is a 79 y.o. 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     Assessment/Plan:   1.  Upper back pain due to bone mets from prostate cancer-controlled with current regimen and no longer on decadron. Last dose decadron was on 01/24/20. (Trying to only use Decadron long-term for hospice patients).  -Continue oxycodone 5 mg every 8 hours as needed pain.   -Discontinue ibuprofen.  Last set of labs showed a mild elevation of creatinine.  Has not been taking ibuprofen in the last several months.  Discussed with patient rationale for stopping ibuprofen.    2.  Anorexia-improved without the use of either Decadron or Megace.  -Continue to monitor    3.  Support-planning on getting Covid vaccine at [facility] this week.  Did have Covid earlier this year and recovered.  -Highly encouraged Covid vaccine.    4. Advanced care planning-  -still with no healthcare power of attorney or living will.  Son will be accompanying [name] on the 11/23 visit and will readdress at that time as well.  -At prior visits: Son-Stephen Bartlett is receptive to having these completed.  [name] is a Clinical research associate.  Shared the prepare for your care form with his son via text.  -his healthcare decision-maker is his son Stephen Bartlett.  -NP did discuss that in the state of [place], his wife will be his agent unless he does get this in writing.  -Patient wants his son to be his primary agent, but he does not have advanced care planning documents.  His secondary agent would be his spouse.  -Discussed values and maintaining his independence and his relationship with his son are important to him.  His strengths include his religious faith and is grateful for his family and being able to live independently.      # Controlled substances risk management.   - Patient does not have a signed pain medication agreement with our team, but this was discussed verbally.   - [place] database was reviewed today and it was appropriate.   - Urine drug screen was not performed at this visit. Findings: not applicable.   - Patient has received information about safe storage and administration of medications.   - Patient has not received a prescription for narcan.      F/u: Phone visit for the second or third week in October and on 11/23 in clinic after Dr. Ainsley Bartlett visit.  ----------------------------------------  Referring Provider: Dr. Vernell Bartlett  Oncology Team: Dr. 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  In January 2019, patient had cord compression and subsequent surgery & XRT with a rod from C7-T8.  His prior tx have been Radium 223, Avodart/Casodex and abiraterone/Lupron. Restaging scans 3/20 (post-Redium) are stable/slightly improved. He is also being followed for a neuroendocrine tumor grade 1 pancreatic mass.     Patient's primary symptom is his upper back pain which is exacerbated by changes in the weather.  He reports that the pain is currently at 7/10, but he has not taken the a.m. ibuprofen 600 mg and the Decadron 2 mg.  He does find some relief with this.  Has some difficulty using the pain scores.  Feels that he still not getting adequate amount of relief.  He describes the pain as a numbing and aching sensation.  He had tried Tylenol in the  past with no relief. Does endorse history of marijuana use.  No other illicit drug use and opioid risk assessment tool was done today.    Patient shared that when he had his spine surgery he could not walk and it took much time and energy for him to regain his strength and walk again. He still drives and is able to do some cooking in the home.  He is independent with his personal ADLs.    ??  Current cancer-directed therapy: ADT- Eligard 45 (6 month injection)  and  Darolutamide         Interval hx, 08/21/19-CK    Patient was hospitalized from 12/2-12/4 for complications from Covid.  He had acute hypoxic respiratory failure with mild diarrhea.  He was sent home with home oxygen and steroids.  Shares that he is hardly using the oxygen now.    Interval hx 05/07/2020 phone visit with patient and CK    Son sharing does not feel that his son needs to accompany the call today.  Have done joint visits with his son on a three-way call in the past.  Shares that his son has been busy with his work in [place].      Symptom Review:  General: Pretty good   Pain: No change to the upper back pain, overall stable.  Using oxycodone 5 mg usually 2 to 3 tablets/day.  Range exacerbates his pain and definitely uses a 3 tablets.  No use of the as needed Motrin.  Fatigue: Fair, still is able to drive and do his own cooking.  Remains active.  Visits with his sister often.  Mobility: Using a 4 pronged cane as needed in the home and usually when he leaves the home.  Sleep:  good  Appetite: Improved without Decadron or Megace  Bowel function: Denies constipation or diarrhea.  Bowel movements are daily and has as needed MiraLAX if needed.  Dyspnea: No issues  Mood: Overall is okay.  Shares again how he derives so much support from his spiritual practice.      Palliative Performance Scale: 80% - Ambulation: Full / Normal Activity with effort, some evidence of disease / Self-Care:Full / Intake: Normal or reduced / Level of Conscious: Full      Coping/Support Issues: Has the support of his spiritual practice.  He had been attending the [employer] on a regular basis prior to  COVID-19.  His most supportive people are his son, who is a Clinical research associate in [place] and his sister who is a Runner, broadcasting/film/video in [place].  He also has a sister who is a retired Charity fundraiser that lives about 1 hour away.  He does live with his wife since 2.  It is a second marriage for him.    Goals of Care: To feel better    Social History:   Name of primary support: Son, wife and sister  Occupation: Retired.  He had worked in the [employer]  Hobbies: Enjoys visiting family and sitting on the front porch.  Current residence / distance from [facility]: lives in [place].  45 minutes from [facility].  Shares it is easy to get to the [facility] campus    Advance Care Planning:   HCPOA:  Natural surrogate decision maker: His son would be his primary agent and his wife would be the secondary  Living Will: No  ACP note:   CODE STATUS: Full    Objective     Opioid Risk Tool:    Male  Male    Family history of substance abuse      Alcohol  1  3    Illegal drugs  2  3    Rx drugs  4  4    Personal history of substance abuse      Alcohol  3  3    Illegal drugs  4  4    Rx drugs  5  5    Age between 52???45 years  1  1    History of preadolescent sexual abuse  3  0    Psychological disease      ADD, OCD, bipolar, schizophrenia  2  2    Depression  1  1       Total: 0  (<3 low risk, 4-7 moderate risk, >8 high risk)    Oncology History   Malignant neoplasm of prostate (CMS-HCC)   09/30/2011 Initial Diagnosis    Malignant neoplasm of prostate (CMS-HCC)     11/23/2018 Endocrine/Hormone Therapy    OP LEUPROLIDE (ELIGARD) 45 MG EVERY 6 MONTHS  Plan Provider: Chrisandra Netters, MD     Bone metastases (CMS-HCC)   08/24/2017 Initial Diagnosis    Bone metastases (CMS-HCC)     11/23/2018 Endocrine/Hormone Therapy    OP LEUPROLIDE (ELIGARD) 45 MG EVERY 6 MONTHS  Plan Provider: Chrisandra Netters, MD         Patient Active Problem List   Diagnosis   ??? Malignant neoplasm of prostate (CMS-HCC)   ??? 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 Hypertension   ??? Calculus of kidney   ??? Acute renal failure (CMS-HCC)   ??? Foreign body in bladder and urethra   ??? Uric acid nephrolithiasis   ??? Renal cyst, acquired, left   ??? Pancreatic mass   ??? Acute pancreatitis   ??? Bronchiectasis (CMS-HCC)   ??? Acute kidney injury (CMS-HCC)   ??? Lesion of pancreas   ??? Bone metastases (CMS-HCC)       Past Medical History:   Diagnosis Date   ??? Calculus of kidney    ??? Hypertension    ??? Malignant neoplasm of prostate (CMS-HCC)        Past Surgical History:   Procedure Laterality Date   ??? PR ARTHRODESIS POSTERIOR/POSTERIORLATERAL CERVICAL BELOW C2 Midline 08/05/2017    Procedure: ARTHRODESIS, POSTERIOR OR POSTEROLATERAL TECHNIQUE, SINGLE LEVEL; CERVICAL BELOW C2 SEGMENT;  Surgeon: Timothy Lasso, MD;  Location: MAIN OR [facility];  Service: Ortho Spine   ??? PR ARTHRODESIS POSTERIOR/POSTEROLATERAL EA ADDL Midline 08/05/2017    Procedure: ARTHRODESIS, POSTERIOR OR POSTEROLATERAL TECHNIQUE, SINGLE LEVEL; EACH ADDITIONAL VERTEBRAL SEGMENT;  Surgeon: Timothy Lasso, MD;  Location: MAIN OR [facility];  Service: Ortho Spine   ??? PR LAMINEC/FACETECT/FORAMIN,CERVICAL 1 SEG Midline 08/05/2017    Procedure: LAMINECTOMY SNGL VERTEBRAL SEGMT-UNI/BIL; CERV;  Surgeon: Timothy Lasso, MD;  Location: MAIN OR [facility];  Service: Ortho Spine   ??? PR LAMINEC/FACETECT/FORAMIN,EACH ADDNL Midline 08/05/2017    Procedure: LAMINECTMY 1 SEGMT-UNI/BIL; EA ADD CERV/THOR/LUM;  Surgeon: Timothy Lasso, MD;  Location: MAIN OR [facility];  Service: Ortho Spine   ??? PR POSTERIOR SEGMENTAL INSTRUMENTATION 7-12 VRT SEG Midline 08/05/2017    Procedure: POSTERIOR SEGMENTAL INSTRUMENTATION; (EG, PEDICLE FIXATION, DUAL RODS W/MULT HOOKS/WIRES) 7-12 VERTEB SEGMT;  Surgeon: Timothy Lasso, MD;  Location: MAIN OR [facility];  Service: Ortho Spine   ??? PR UPGI ENDOSCOPY,FN NEEDLE BX,GUIDED N/A 02/11/2017    Procedure: UGI W/TRANSENDOSCOPIC US-GUIDE INTRA/TRANSMURAL NEEDLE ASP/BX (INCL EXAM ESOPHAGUS, STOMACH,  DOUDENUM/JEJ);  Surgeon: Vonda Antigua, MD;  Location: [facility];  Service: Gastroenterology   ??? URETEROSCOPY         Current Outpatient Medications   Medication Sig Dispense Refill   ??? allopurinoL (ZYLOPRIM) 100 MG tablet Take 1 tablet (100 mg total) by mouth daily. 90 tablet 3   ??? amLODIPine (NORVASC) 5 MG tablet Take 1 tablet by mouth daily.     ??? atorvastatin (LIPITOR) 10 MG tablet Take 10 mg by mouth nightly.      ??? carvediloL (COREG) 6.25 MG tablet Take 1 tablet (6.25 mg total) by mouth Two (2) times a day. 60 tablet 11   ??? darolutamide (NUBEQA) 300 mg tablet Take 2 tablets (600 mg total) by mouth 2 (two) times a day with meals. Take with food. Swallow tablets whole. 120 tablet 11   ??? docusate sodium (COLACE) 100 MG capsule Take 100 mg by mouth two (2) times a day as needed for constipation.     ??? furosemide (LASIX) 20 MG tablet Take 1 tablet (20 mg total) by mouth daily for 14 days. 14 tablet 0   ??? oxyCODONE (ROXICODONE) 5 MG immediate release tablet Take 1 tablet (5 mg total) by mouth every eight (8) hours as needed for pain. 90 tablet 0   ??? polyethylene glycol (MIRALAX) 17 gram packet Take 17 g by mouth two (2) times a day as needed.       No current facility-administered medications for this visit.     Facility-Administered Medications Ordered in Other Visits   Medication Dose Route Frequency Provider Last Rate Last Admin   ??? leuprolide (6 month) (ELIGARD) 45 mg injection                Allergies: No Known Allergies    Family History:  Cancer-related family history includes Prostate cancer in an other family member. There is no history of Kidney cancer.  He indicated that the status of his mother is unknown. He indicated that the status of his neg hx is unknown. He indicated that the status of his other is unknown.        Lab Results   Component Value Date    CREATININE 1.37 (H) 04/03/2020     Lab Results   Component Value Date    ALKPHOS 102 04/03/2020    BILITOT 0.9 04/03/2020    PROT 6.9 04/03/2020    ALBUMIN 3.1 (L) 04/03/2020    ALT 8 (L) 04/03/2020    AST 18 04/03/2020       Pam Drown, FNP-BC, [facility]  Outpatient Oncology Palliative Care Service  [facility]  [address] [place], [place] [address]  [phone]      I spent 11 minutes on the phone with the patient. I spent an additional 10 minutes on pre- and post-visit activities.     The patient was physically located in [place] or a state in which I am permitted to provide care. The patient and/or parent/gauardian understood that s/he may incur co-pays and cost sharing, and agreed to the telemedicine visit. The visit was completed via phone and/or video, which was appropriate and reasonable under the circumstances given the patient's presentation at the time.    The patient and/or parent/guardian has been advised of the potential risks and limitations of this mode of treatment (including, but not limited to, the absence of in-person examination) and has agreed to be treated using telemedicine. The patient's/patient's family's questions regarding telemedicine have  been answered.     If the phone/video visit was completed in an ambulatory setting, the patient and/or parent/guardian has also been advised to contact their provider???s office for worsening conditions, and seek emergency medical treatment and/or call 911 if the patient deems either necessary.

## 2020-06-19 NOTE — Unmapped (Signed)
No reply to phone call.  Voicemail left and called patient's son and left a voicemail as well.  Also sent a text to patient's son with no reply.  Today was scheduled for a phone visit. No visit today.    Burtis Junes, FNP-BC, [facility]  Outpatient Oncology Palliative Care Service  [facility]  [address] [place], [place] [address]  [phone]

## 2020-06-23 DIAGNOSIS — G893 Neoplasm related pain (acute) (chronic): Principal | ICD-10-CM

## 2020-06-23 MED ORDER — DEXAMETHASONE 0.5 MG TABLET
ORAL_TABLET | 0 refills | 0 days
Start: 2020-06-23 — End: ?

## 2020-06-24 DIAGNOSIS — G893 Neoplasm related pain (acute) (chronic): Principal | ICD-10-CM

## 2020-06-24 MED ORDER — DEXAMETHASONE 0.5 MG TABLET
ORAL_TABLET | 0 refills | 0 days
Start: 2020-06-24 — End: ?

## 2020-06-25 NOTE — Unmapped (Signed)
Hi Boneta Lucks, can you review my notes and call Mr. Deller? We were supposed to have a visit last week, but he didn't pick up. No longer on decadron x 5 months. thanks

## 2020-06-25 NOTE — Unmapped (Signed)
Please refill if appropriate

## 2020-06-26 NOTE — Unmapped (Signed)
Spoke with pt by phone for check in. Appreciative of call.  He was unaware for his appt yesterday, says he was by the phone call day.    Tried to review his medication on phone but he didn't have the pill bottles in front of him and he was a bit unsure of exactly what he takes every day (couldn't remember the drug names just the conditions they are Rxed for). Says he needed a refill for all his meds, RN explained our team only refills certain medications. He is NOT taking dex any more    He continues to take the oxy 5 mg for pain 2-3 tab/ day, states he has about 1-2 weeks worth he thinks, left. With the cold weather now present this causes him more pain.     Reminded him of his 11/23 appt at hospital he will remind his son to bring and he wrote the date down on a calender. Reminded him to bring in all his medications so we can help him properly review.    No other questions at this time.

## 2020-06-30 MED ORDER — DEXAMETHASONE 0.5 MG TABLET
ORAL_TABLET | 0 refills | 0 days
Start: 2020-06-30 — End: ?

## 2020-07-15 DIAGNOSIS — C61 Malignant neoplasm of prostate: Principal | ICD-10-CM

## 2020-07-16 ENCOUNTER — Institutional Professional Consult (permissible substitution): Admit: 2020-07-16 | Discharge: 2020-07-17 | Payer: MEDICARE

## 2020-07-16 DIAGNOSIS — C7951 Secondary malignant neoplasm of bone: Principal | ICD-10-CM

## 2020-07-16 DIAGNOSIS — G893 Neoplasm related pain (acute) (chronic): Principal | ICD-10-CM

## 2020-07-16 DIAGNOSIS — C61 Malignant neoplasm of prostate: Principal | ICD-10-CM

## 2020-07-16 DIAGNOSIS — Z515 Encounter for palliative care: Principal | ICD-10-CM

## 2020-07-16 MED ORDER — OXYCODONE 5 MG TABLET
ORAL_TABLET | Freq: Three times a day (TID) | ORAL | 0 refills | 30 days | Status: CP | PRN
Start: 2020-07-16 — End: 2020-08-29

## 2020-07-16 NOTE — Unmapped (Signed)
OUTPATIENT ONCOLOGY PALLIATIVE CARE    Principal Diagnosis: [name] is a 79 y.o. 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     Assessment/Plan:   1.  Upper back pain due to bone mets from prostate cancer-controlled with current regimen and no longer on decadron. Last dose decadron was on 01/24/20. (Trying to only use Decadron long-term for hospice patients).  -Continue oxycodone 5 mg every 8 hours as needed pain-averaging 2 tabs/day  -Discontinue ibuprofen.  Last set of labs showed a mild elevation of creatinine.  Has not been taking ibuprofen in the last several months.      2. Support- Did have Covid earlier this year and recovered.  Was able to get his flu vaccine and both injections of the Covid vaccine.  He is waiting for the 57-month window to get the Covid booster.  -Able to connect his son to the [facility] with [name] permission.  Shared that this would be a good way to improve communication and to confirm appointments.      3. Advanced care planning-  -still with no healthcare power of attorney or living will.  Son will be accompanying Stephen Bartlett on the 12/9 visit and will have Stephen Lai RN provide the Prepare for your care document.       -At prior visits: Son-Stephen is receptive to having these completed.  Stephen works at a  [employer], water quality.  Shared the prepare for your care form with his son via text.  -HCDM-son Stephen Bartlett.  -NP did discuss that in the state of [place], his wife will be his agent unless he does get this in writing.  -Patient wants his son Stephen Bartlett to be his primary agent, but he does not have advanced care planning documents.  His secondary agent would be his spouse.  -Discussed values and maintaining his independence and his relationship with his son are important to him.  His strengths include his religious faith and is grateful for his family and being able to live independently.      # Controlled substances risk management.   - Patient does not have a signed pain medication agreement with our team, but this was discussed verbally.   - [place]CCS[place] database was reviewed today and it was appropriate.   - Urine drug screen was not performed at this visit. Findings: not applicable.   - Patient has received information about safe storage and administration of medications.   - Patient has not received a prescription for narcan.      F/u: phone visit 09/02/20 8:30 am.  ----------------------------------------  Referring Provider: Dr. Vernell Bartlett  Oncology Team: Dr. 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  In January 2019, patient had cord compression and subsequent surgery & XRT with a rod from C7-T8.  His prior tx have been Radium 223, Avodart/Casodex and abiraterone/Lupron. Restaging scans 3/20 (post-Redium) are stable/slightly improved. He is also being followed for a neuroendocrine tumor grade 1 pancreatic mass.     Patient's primary symptom is his upper back pain which is exacerbated by changes in the weather.  He reports that the pain is currently at 7/10, but he has not taken the a.m. ibuprofen 600 mg and the Decadron 2 mg.  He does find some relief with this.  Has some difficulty using the pain scores.  Feels that he still not getting adequate amount of relief.  He describes the pain as a numbing and aching sensation.  He had tried Tylenol in the past with no relief. Does endorse history of marijuana use.  No other illicit drug use and opioid risk assessment tool was done today.    Patient shared that when he had his spine surgery he could not walk and it took much time and energy for him to regain his strength and walk again. He still drives and is able to do some cooking in the home.  He is independent with his personal ADLs.    ??  Current cancer-directed therapy: ADT- Eligard 45 (6 month injection)  and  Darolutamide         Interval hx, 08/21/19-CK    Patient was hospitalized from 12/2-12/4 for complications from Covid.  He had acute hypoxic respiratory failure with mild diarrhea.  He was sent home with home oxygen and steroids.  Shares that he is hardly using the oxygen now.      Interval hx 07/16/20 phone visit with [name], his son Stephen and CK    Back pain and stiffness and coldness makes it worse. Using oxycodone 5 mg averaging 2 tablets/day.    Symptom Review:  General: Well overall  Pain: No change to the upper back pain, overall stable.  Using oxycodone 5 mg usually 2 to 3 tablets/day.  Rain and cold weather exacerbates his pain and definitely uses a 3 tablets.  Rare use as needed Motrin.  Fatigue: Energy good-remains active. Drives, shops and visits sister who lives locally.  Mobility: Using a 4 pronged cane as needed in the home and usually when he leaves the home. Feels that he is walking has been this past week.   Sleep: not assessed  Appetite: Continues to be good without Decadron or Megace.  Feels that his weight has been stable.  Bowel function: Denies constipation or diarrhea.  Bowel movements are daily and has as needed MiraLAX if needed.  Dyspnea: No issues  Mood: Good, no issues with anxiety or depression.    Palliative Performance Scale: 80% - Ambulation: Full / Normal Activity with effort, some evidence of disease / Self-Care:Full / Intake: Normal or reduced / Level of Conscious: Full      Coping/Support Issues: Has the support of his spiritual practice.  He had been attending the [employer] on a regular basis prior to  COVID-19.  His most supportive people are his son, who is a Clinical research associate in [place] and his sister who is a Runner, broadcasting/film/video in [place].  He also has a sister who is a retired Charity fundraiser that lives about 1 hour away.  He does live with his wife since 67.  It is a second marriage for him.    Goals of Care: To feel better    Social History:   Name of primary support: Son, wife and sister  Occupation: Retired.  He had worked in the [employer]  Hobbies: Enjoys visiting family and sitting on the front porch.  Current residence / distance from [facility]: lives in [place].  45 minutes from [facility].  Shares it is easy to get to the [facility]    Advance Care Planning:   HCPOA:  Natural surrogate decision maker: His son would be his primary agent and his wife would be the secondary  Living Will: No  ACP note:   CODE STATUS: Full    Objective     Opioid Risk Tool:    Male  Male  Family history of substance abuse      Alcohol  1  3    Illegal drugs  2  3    Rx drugs  4  4    Personal history of substance abuse      Alcohol  3  3    Illegal drugs  4  4    Rx drugs  5  5    Age between 72???45 years  1  1    History of preadolescent sexual abuse  3  0    Psychological disease      ADD, OCD, bipolar, schizophrenia  2  2    Depression  1  1       Total: 0  (<3 low risk, 4-7 moderate risk, >8 high risk)    Oncology History   Malignant neoplasm of prostate (CMS-HCC)   09/30/2011 Initial Diagnosis    Malignant neoplasm of prostate (CMS-HCC)     11/23/2018 Endocrine/Hormone Therapy    OP LEUPROLIDE (ELIGARD) 45 MG EVERY 6 MONTHS  Plan Provider: Chrisandra Netters, MD     Bone metastases (CMS-HCC)   08/24/2017 Initial Diagnosis    Bone metastases (CMS-HCC)     11/23/2018 Endocrine/Hormone Therapy    OP LEUPROLIDE (ELIGARD) 45 MG EVERY 6 MONTHS  Plan Provider: Chrisandra Netters, MD         Patient Active Problem List   Diagnosis   ??? Malignant neoplasm of prostate (CMS-HCC)   ??? 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 Hypertension   ??? Calculus of kidney   ??? Acute renal failure (CMS-HCC)   ??? Foreign body in bladder and urethra   ??? Uric acid nephrolithiasis   ??? Renal cyst, acquired, left   ??? Pancreatic mass   ??? Acute pancreatitis   ??? Bronchiectasis (CMS-HCC)   ??? Acute kidney injury (CMS-HCC)   ??? Lesion of pancreas   ??? Bone metastases (CMS-HCC)       Past Medical History:   Diagnosis Date   ??? Calculus of kidney    ??? Hypertension    ??? Malignant neoplasm of prostate (CMS-HCC)        Past Surgical History:   Procedure Laterality Date   ??? PR ARTHRODESIS POSTERIOR/POSTERIORLATERAL CERVICAL BELOW C2 Midline 08/05/2017    Procedure: ARTHRODESIS, POSTERIOR OR POSTEROLATERAL TECHNIQUE, SINGLE LEVEL; CERVICAL BELOW C2 SEGMENT;  Surgeon: Timothy Lasso, MD;  Location: MAIN OR [facility];  Service: Ortho Spine   ??? PR ARTHRODESIS POSTERIOR/POSTEROLATERAL EA ADDL Midline 08/05/2017    Procedure: ARTHRODESIS, POSTERIOR OR POSTEROLATERAL TECHNIQUE, SINGLE LEVEL; EACH ADDITIONAL VERTEBRAL SEGMENT;  Surgeon: Timothy Lasso, MD;  Location: MAIN OR [facility];  Service: Ortho Spine   ??? PR LAMINEC/FACETECT/FORAMIN,CERVICAL 1 SEG Midline 08/05/2017    Procedure: LAMINECTOMY SNGL VERTEBRAL SEGMT-UNI/BIL; CERV;  Surgeon: Timothy Lasso, MD;  Location: MAIN [facility];  Service: Ortho Spine   ??? PR LAMINEC/FACETECT/FORAMIN,EACH ADDNL Midline 08/05/2017    Procedure: LAMINECTMY 1 SEGMT-UNI/BIL; EA ADD CERV/THOR/LUM;  Surgeon: Timothy Lasso, MD;  Location: MAIN OR [facility];  Service: Ortho Spine   ??? PR POSTERIOR SEGMENTAL INSTRUMENTATION 7-12 VRT SEG Midline 08/05/2017    Procedure: POSTERIOR SEGMENTAL INSTRUMENTATION; (EG, PEDICLE FIXATION, DUAL RODS W/MULT HOOKS/WIRES) 7-12 VERTEB SEGMT;  Surgeon: Timothy Lasso, MD;  Location: MAIN OR [facility];  Service: Ortho Spine   ??? PR UPGI ENDOSCOPY,FN NEEDLE BX,GUIDED N/A 02/11/2017    Procedure: UGI W/TRANSENDOSCOPIC US-GUIDE INTRA/TRANSMURAL NEEDLE ASP/BX (INCL EXAM ESOPHAGUS, STOMACH, DOUDENUM/JEJ);  Surgeon: Tawanna Cooler  Gevena Barre, MD;  [facility];  Service: Gastroenterology   ??? URETEROSCOPY         Current Outpatient Medications   Medication Sig Dispense Refill   ??? allopurinoL (ZYLOPRIM) 100 MG tablet Take 1 tablet (100 mg total) by mouth daily. 90 tablet 3   ??? amLODIPine (NORVASC) 5 MG tablet Take 1 tablet by mouth daily.     ??? atorvastatin (LIPITOR) 10 MG tablet Take 10 mg by mouth nightly.      ??? carvediloL (COREG) 6.25 MG tablet Take 1 tablet (6.25 mg total) by mouth Two (2) times a day. 60 tablet 11   ??? darolutamide (NUBEQA) 300 mg tablet Take 2 tablets (600 mg total) by mouth 2 (two) times a day with meals. Take with food. Swallow tablets whole. 120 tablet 11   ??? docusate sodium (COLACE) 100 MG capsule Take 100 mg by mouth two (2) times a day as needed for constipation.     ??? furosemide (LASIX) 20 MG tablet Take 1 tablet (20 mg total) by mouth daily for 14 days. 14 tablet 0   ??? oxyCODONE (ROXICODONE) 5 MG immediate release tablet Take 1 tablet (5 mg total) by mouth every eight (8) hours as needed for pain. 90 tablet 0   ??? polyethylene glycol (MIRALAX) 17 gram packet Take 17 g by mouth two (2) times a day as needed.       No current facility-administered medications for this visit.     Facility-Administered Medications Ordered in Other Visits   Medication Dose Route Frequency Provider Last Rate Last Admin   ??? leuprolide (6 month) (ELIGARD) 45 mg injection                Allergies: No Known Allergies    Family History:  Cancer-related family history includes Prostate cancer in an other family member. There is no history of Kidney cancer.  He indicated that the status of his mother is unknown. He indicated that the status of his neg hx is unknown. He indicated that the status of his other is unknown.        Lab Results   Component Value Date    CREATININE 1.37 (H) 04/03/2020     Lab Results   Component Value Date    ALKPHOS 102 04/03/2020    BILITOT 0.9 04/03/2020    PROT 6.9 04/03/2020    ALBUMIN 3.1 (L) 04/03/2020    ALT 8 (L) 04/03/2020    AST 18 04/03/2020       Pam Drown, FNP-BC, [facility]  Outpatient Oncology Palliative Care Service  [facility]  [address], [place], [place] [address]  [phone]      I spent 21 minutes on the phone with the patient. I spent an additional 10 minutes on pre- and post-visit activities.     The patient was physically located in [place] or a state in which I am permitted to provide care. The patient and/or parent/gauardian understood that s/he may incur co-pays and cost sharing, and agreed to the telemedicine visit. The visit was completed via phone and/or video, which was appropriate and reasonable under the circumstances given the patient's presentation at the time.    The patient and/or parent/guardian has been advised of the potential risks and limitations of this mode of treatment (including, but not limited to, the absence of in-person examination) and has agreed to be treated using telemedicine. The patient's/patient's family's questions regarding telemedicine have been answered.  If the phone/video visit was completed in an ambulatory setting, the patient and/or parent/guardian has also been advised to contact their provider???s office for worsening conditions, and seek emergency medical treatment and/or call 911 if the patient deems either necessary.

## 2020-07-28 DIAGNOSIS — C61 Malignant neoplasm of prostate: Principal | ICD-10-CM

## 2020-07-28 MED ORDER — NUBEQA 300 MG TABLET
ORAL_TABLET | Freq: Two times a day (BID) | ORAL | 11 refills | 30.00000 days | Status: CN
Start: 2020-07-28 — End: ?

## 2020-07-28 NOTE — Unmapped (Signed)
[facility] Refill Coordination Note    Specialty Medication(s) to be Shipped:   Hematology/Oncology: Nubeqa    Other medication(s) to be shipped: No additional medications requested for fill at this time     [name], DOB: 06/08/1941  Phone: [phone] (home)       All above HIPAA information was verified with patient's caregiver, [name]     Was a Nurse, learning disability used for this call? No    Completed refill call assessment today to schedule patient's medication shipment from the [facility] [phone]).       Specialty medication(s) and dose(s) confirmed: Regimen is correct and unchanged.   Changes to medications: [name] reports no changes at this time.  Changes to insurance: No  Questions for the pharmacist: No    Confirmed patient received Welcome Packet with first shipment. The patient will receive a drug information handout for each medication shipped and additional FDA Medication Guides as required.       DISEASE/MEDICATION-SPECIFIC INFORMATION        N/A    SPECIALTY MEDICATION ADHERENCE     Medication Adherence    Patient reported X missed doses in the last month: 0  Specialty Medication: Nubeqa 300mg   Patient is on additional specialty medications: No        Nubeqa 300 mg: 3 days of medicine on hand     SHIPPING     Shipping address confirmed in Epic.     Delivery Scheduled: Yes, Expected medication delivery date: 07/30/2020.  However, Rx request for refills was sent to the provider as there are none remaining.     Medication will be delivered via Same Day Courier to the prescription address in Epic WAM.    Oretha Milch   [facility] Specialty Technician

## 2020-07-29 NOTE — Unmapped (Signed)
Please refill if appropriate

## 2020-07-30 DIAGNOSIS — C61 Malignant neoplasm of prostate: Principal | ICD-10-CM

## 2020-07-30 NOTE — Unmapped (Signed)
Stephen Bartlett 's NUBEQA shipment will be delayed as a result of no refills remain on the prescription.      I have reached out to the patient Stephen Bartlett and communicated the delay. We will call the patient back to reschedule the delivery upon resolution. We have not confirmed the new delivery date.

## 2020-07-31 ENCOUNTER — Other Ambulatory Visit: Admit: 2020-07-31 | Discharge: 2020-07-31 | Payer: MEDICARE

## 2020-07-31 ENCOUNTER — Ambulatory Visit: Admit: 2020-07-31 | Discharge: 2020-07-31 | Payer: MEDICARE | Attending: Medical Oncology | Primary: Medical Oncology

## 2020-07-31 ENCOUNTER — Ambulatory Visit: Admit: 2020-07-31 | Discharge: 2020-07-31 | Payer: MEDICARE

## 2020-07-31 DIAGNOSIS — C7951 Secondary malignant neoplasm of bone: Principal | ICD-10-CM

## 2020-07-31 DIAGNOSIS — J479 Bronchiectasis, uncomplicated: Principal | ICD-10-CM

## 2020-07-31 DIAGNOSIS — I10 Essential (primary) hypertension: Principal | ICD-10-CM

## 2020-07-31 DIAGNOSIS — M898X9 Other specified disorders of bone, unspecified site: Principal | ICD-10-CM

## 2020-07-31 DIAGNOSIS — C61 Malignant neoplasm of prostate: Principal | ICD-10-CM

## 2020-07-31 DIAGNOSIS — K7689 Other specified diseases of liver: Principal | ICD-10-CM

## 2020-07-31 DIAGNOSIS — K8689 Other specified diseases of pancreas: Principal | ICD-10-CM

## 2020-07-31 DIAGNOSIS — Z5111 Encounter for antineoplastic chemotherapy: Principal | ICD-10-CM

## 2020-07-31 DIAGNOSIS — G9520 Unspecified cord compression: Principal | ICD-10-CM

## 2020-07-31 LAB — PSA: PROSTATE SPECIFIC ANTIGEN: 20.65 ng/mL — ABNORMAL HIGH (ref 0.00–4.00)

## 2020-07-31 LAB — COMPREHENSIVE METABOLIC PANEL
ALBUMIN: 3.9 g/dL (ref 3.4–5.0)
ALKALINE PHOSPHATASE: 121 U/L — ABNORMAL HIGH (ref 46–116)
ALT (SGPT): 12 U/L (ref 10–49)
ANION GAP: 6 mmol/L (ref 5–14)
AST (SGOT): 23 U/L (ref <=34)
BILIRUBIN TOTAL: 1.1 mg/dL (ref 0.3–1.2)
BLOOD UREA NITROGEN: 15 mg/dL (ref 9–23)
BUN / CREAT RATIO: 14
CALCIUM: 9.8 mg/dL (ref 8.7–10.4)
CHLORIDE: 106 mmol/L (ref 98–107)
CO2: 27 mmol/L (ref 20.0–31.0)
CREATININE: 1.11 mg/dL — ABNORMAL HIGH
EGFR CKD-EPI AA MALE: 73 mL/min/{1.73_m2} (ref >=60)
EGFR CKD-EPI NON-AA MALE: 63 mL/min/{1.73_m2} (ref >=60)
GLUCOSE RANDOM: 102 mg/dL (ref 70–179)
POTASSIUM: 3.9 mmol/L (ref 3.5–5.1)
PROTEIN TOTAL: 7.2 g/dL (ref 5.7–8.2)
SODIUM: 139 mmol/L (ref 135–145)

## 2020-07-31 LAB — TESTOSTERONE: TESTOSTERONE TOTAL: <7 ng/dL — ABNORMAL LOW

## 2020-07-31 MED ORDER — NUBEQA 300 MG TABLET: 600 mg | tablet | Freq: Two times a day (BID) | 11 refills | 30 days

## 2020-07-31 MED ORDER — DAROLUTAMIDE 300 MG TABLET
ORAL_TABLET | Freq: Two times a day (BID) | ORAL | 11 refills | 30.00000 days | Status: CP
Start: 2020-07-31 — End: 2020-07-31
  Filled 2020-08-04: qty 120, 30d supply, fill #0

## 2020-07-31 MED ADMIN — leuprolide (6 month) (ELIGARD) injection 45 mg: 45 mg | SUBCUTANEOUS | @ 17:00:00 | Stop: 2020-07-31

## 2020-07-31 NOTE — Unmapped (Signed)
Pt tolerated Eligard injection w/o difficulty. Awaiting Provider. NAD, no concerns voiced at this time.

## 2020-07-31 NOTE — Unmapped (Signed)
Medical Oncology: Prostate Cancer     Assessment:  Castration resistant metastatic prostate cancer to bone, s/p treatment with abiraterone/Lupron (experienced cord compression and discontinued 1/19, and underwent neurosurgery), and radium-223 (6/19 - 11/19).  Also has neuroendocrine tumor grade 1 pancreatic mass.  Restaging scans 06/2019 and 10/2018 were stable post-Radium.  He started darolutamide (held briefly during a hospitalization for COVID-19 in late 2020).  PSA was 31 in 08/2018 to 60 in 05/2019, then 51.5 on 06/28/2019 and 45.9 on 09/13/19 and 34.5 on 11/22/19 and down to 17.76 on 04/03/20 with castrate testosterone levels.  Today up slightly.    Lab Results   Component Value Date    PSA 20.65 (H) 07/31/2020    PSA 17.76 (H) 04/03/2020    PSA 34.50 (H) 11/22/2019    PSA 45.90 (H) 09/13/2019    PSA 51.50 (H) 06/28/2019    PSA 60.40 (H) 05/31/2019       He has had challenges with organizing medications at home.  He lives with his wife who works and is not able to help with this, and his son lives in [place] but is becoming more involved.    He is followed by Palliative Care for back pain in his back at the area of prior surgery where he has a rod.  He is on Oxycodone.  He has few treatment options, and he understands the goals of care are supportive and palliative.  He has done advanced care planning with Palliative Care.    There has been some question about his compliance with treatment, as he has had excess drug remaining at times of refills.  He has been counseled multiple times by Pharmacy, and Pharmacy and I will counsel him today and review meds.    Plan:  - Continue ADT; Eligard every 36-months (07/31/2020).  - Continue darolutamide.  - Follow PSA.  - Blood pressure control with Katina Degree, PharmD.  - Followed by Palliative Care for pain control, thank you.  - Prior increased frequency of urination:  Better since stopping tamsulsoin. PVR has been low.  - LEE, improved with diuretic with his PCP -- he will follow up with PCP about this.  - Pain: Steroids have helped, ibuprofen has not. Continue dexamethasone.  - HTN  - Follow PSA.  - LEE: Diuretic as needed with his PCP and Palliative Care.  - Code status: continue to discuss next visit    - Advanced care planning: has been done with Palliative Care.    Follow up with Katina Degree PharmD about medications and blood pressure.  Follow up with me in 3 months for labs and follow up, and then in 6 months for Eligard.    ---------------------------------------------------------    HPI:  - Prior patient of Dr. Verl Bangs  - 2013: Presented with metastatic prostate cancer to bone, PSA ~900, started Lupron, then Avodart/Casodex, then abiraterone/Lupron  - 1/19: Experienced cord compression while on Lupron/abiraterone --> RT  - Neuroendocrine tumor grade 1 pancreatic mass will be observed.  - 08/11/17: Bone scan: c/w 5/18, stable metastatic disease in the thoracic spine and right hemipelvis.  Redemonstration of radiotracer uptake in the upper thoracic vertebral body. Slightly decreased uptake within the right hemipelvis, consistent with previously seen mixed sclerotic/lytic lesions. No new areas of radiotracer uptake. Physiologic uptake is seen in the kidneys and bladder.  - 09/08/17: CT A/P: 1. Interval improvement in previously visualized pancreatic tail stranding and free fluid with lobulated intermediate density peripancreatic collection along the pancreatic tail, closely abutting  the anterior aspect of the spleen. Findings may represent complex pseudocyst in the setting of prior pancreatitis. MRI MRCP is recommended for further evaluation as known neuroendocrine pancreatic tumor is not well delineated on this portal venous phase CT and to exclude postcontrast enhancement of the suspected complex pancreatic pseudocyst along the tail.  2. Right lower lobe bronchiectasis with mucous plugging.  3. Hepatic hypodensities, unchanged and favored to represent hepatic cysts. This may be also be confirmed by MRI.  2/19: Discontinued abiraterone  6-11/19: Radium-223  11/02/18: CT A/P: No new lytic or blastic osseous lesions. Similar appearance of mixed sclerotic and lytic lesion in the right hemipelvis.  11/02/18: Bone Scan: Slightly decrease in radiotracer uptake involving the thoracic spine and right hemipelvis when compared to prior imaging.  05/31/19: PSA 60.4  06/25/19: CT A/P: Similar appearance of mixed sclerotic and lytic lesions in the right hemipelvis. No new lytic or blastic lesions.  Similar appearance of hyperenhancing pancreatic body mass, compatible with pancreatic neuroendocrine tumor.  Decreased size of fluid collection adjacent to the pancreatic tail/inferior margin of the spleen.  Bronchiectasis with mucous plugging in the right lower lobe, similar to prior.  06/25/19: Similar focal increase in radiotracer uptake within the right hemipelvis when compared to prior bone scan from 11/02/2018. Stable focal uptake in the upper thoracic vertebral bodies.    ROS: Feeling well today. Pain is stable, no new pain. Urinary frequency better since stopping tamsulosin.    Current Outpatient Medications on File Prior to Visit   Medication Sig Dispense Refill   ??? allopurinoL (ZYLOPRIM) 100 MG tablet Take 1 tablet (100 mg total) by mouth daily. 90 tablet 3   ??? amLODIPine (NORVASC) 5 MG tablet Take 1 tablet by mouth daily.     ??? atorvastatin (LIPITOR) 10 MG tablet Take 10 mg by mouth nightly.      ??? carvediloL (COREG) 6.25 MG tablet Take 1 tablet (6.25 mg total) by mouth Two (2) times a day. 60 tablet 11   ??? darolutamide (NUBEQA) 300 mg tablet Take 2 tablets (600 mg total) by mouth 2 (two) times a day with meals. Take with food. Swallow tablets whole. 120 tablet 11   ??? docusate sodium (COLACE) 100 MG capsule Take 100 mg by mouth two (2) times a day as needed for constipation.     ??? furosemide (LASIX) 20 MG tablet Take 1 tablet (20 mg total) by mouth daily for 14 days. 14 tablet 0   ??? oxyCODONE (ROXICODONE) 5 MG immediate release tablet Take 1 tablet (5 mg total) by mouth every eight (8) hours as needed for pain. 90 tablet 0   ??? polyethylene glycol (MIRALAX) 17 gram packet Take 17 g by mouth two (2) times a day as needed.       Current Facility-Administered Medications on File Prior to Visit   Medication Dose Route Frequency Provider Last Rate Last Admin   ??? leuprolide (6 month) (ELIGARD) 45 mg injection                PE:  BP 139/68  - Pulse 99  - Temp 36.3 ??C (97.4 ??F) (Temporal)  - Resp 18  - Ht 172.7 cm (5' 8)  - Wt 69.4 kg (153 lb)  - SpO2 99%  - BMI 23.26 kg/m??   NAD  A+Ox3  No rash  Abd ND    Data:    PSA   Date Value Ref Range Status   07/31/2020 20.65 (H) 0.00 - 4.00 ng/mL Final  04/03/2020 17.76 (H) 0.00 - 4.00 ng/mL Final   11/22/2019 34.50 (H) 0.00 - 4.00 ng/mL Final   09/13/2019 45.90 (H) 0.00 - 4.00 ng/mL Final   06/28/2019 51.50 (H) 0.00 - 4.00 ng/mL Final   05/31/2019 60.40 (H) 0.00 - 4.00 ng/mL Final   08/31/2018 31.30 (H) 0.00 - 4.00 ng/mL Final   07/06/2018 35.00 (H) 0.00 - 4.00 ng/mL Final   06/01/2018 33.10 (H) 0.00 - 4.00 ng/mL Final   05/04/2018 42.00 (H) 0.00 - 4.00 ng/mL Final   03/30/2018 42.70 (H) 0.00 - 4.00 ng/mL Final   01/26/2018 46.20 (H) 0.00 - 4.00 ng/mL Final   08/25/2017 61.50 (H) 0.00 - 4.00 ng/mL Final   08/08/2017 80.90 (H) 0.00 - 4.00 ng/mL Final   05/19/2017 34.70 (H) 0.00 - 4.00 ng/mL Final   02/10/2017 30.50 (H) 0.00 - 4.00 ng/mL Final   01/20/2017 19.60 (H) 0.00 - 4.00 ng/mL Final   01/07/2017 18.70 (H) 0.00 - 4.00 ng/mL Final   12/22/2016 19.40 (H) 0.00 - 4.00 ng/mL Final   12/03/2016 31.70 (H) 0.00 - 4.00 ng/mL Final   11/04/2016 30.30 (H) 0.00 - 4.00 ng/mL Final   08/06/2016 18.80 (H) 0.00 - 4.00 ng/mL Final   05/06/2016 24.10 (H) 0.00 - 4.00 ng/mL Final   01/29/2016 12.50 (H) 0.00 - 4.00 ng/mL Final   10/30/2015 11.10 (H) 0.00 - 4.00 ng/mL Final   07/24/2015 7.41 (H) 0.00 - 4.00 ng/mL Final   04/03/2015 3.44 0.00 - 4.00 ng/mL Final   12/23/2014 12.50 (H) 0.00 - 4.00 ng/mL Final

## 2020-07-31 NOTE — Unmapped (Addendum)
[name] 's NUBEQA shipment has been reschedule as a result of new prescription received.    ??  I have reached out to [name] and rescheduled as a same day courier for 08/04/20 .       Horace Porteous, PharmD  [facility]

## 2020-08-01 NOTE — Unmapped (Signed)
Clinical Pharmacist Practitioner: [facility]    Patient Name: Stephen Bartlett  Patient Age: 78 y.o.    I am seeing Stephen Bartlett  today for medication management.     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 ??Also has neuroendocrine tumor grade 1 pancreatic mass.??Restaging scans 06/2019 and??10/2018 have been stable??post-Radium.??His pain is generally well controlled with PRN analgesics, and mainly is in his back at the area of prior surgery where he has a rod, it is worse when it is rainy. ??Despite stable scans, PSA has been rising, from 31 in 08/2018 to 60 in 05/2019.    1. mCRPC - patient taking Darolutamide 600 mg PO BID, having some adherence issues but have discussed importance of taking medications and we are looking into reasources to help with medication management at home.  All other labs stable. PSA from PSA slightly up from last check at 20/65 from 17.76..   - Continue darolutamide 600 mg BID  - Eligard 45 mg due on or after 01/28/20    2. Edema- reports intermittent edema that resolves with elevation.   - CTM  ??  3. Pain - pain is managed with Oxycodone 5 mg PO q8h prn  - Managed by palliative care  ??  4. HTN - BP currently well controlled taking amlodipine and carvedilol  - Continue carvedilol 6.25 mg BID and amlodipine 5 mg daily    7. Medication Management- our team will be reaching out to home health to look for medication management resources     Follow- up: Dr. [name] on 10/31/19, CPP follow up in 1 months    ______________________________________________________________________    Reason for visit:  Darolutamide monitoring and side effect management    Current Drug and Dose:  Darolutamide 600 mg BID    Date of Initiation: 08/07/19-08/12/19 (stopped due to headache), restarted 09/20/19    Oral Agent Toxicities:  Dizziness    Interval History:  Stephen Bartlett contiunes to do well on his current regimen. We discussed adherence mainly and different ways to approach making sure he remembers to take his medicine. He has a pill box he uses. His sister was helping him but had a death in the family recently and has been dealing with that. They have had several people come to the house but no one can help with medication management. He is trying his best.     Adherence: Reports taking 2 tablets twice daily with food has missed several doses, has a pill box but minimal support at home to help with medication management    Drug Interactions: none    Oncology History   Malignant neoplasm of prostate (CMS-HCC)   09/30/2011 Initial Diagnosis    Malignant neoplasm of prostate (CMS-HCC)     11/23/2018 Endocrine/Hormone Therapy    OP LEUPROLIDE (ELIGARD) 45 MG EVERY 6 MONTHS  Plan Provider: Chrisandra Netters, MD     Bone metastases (CMS-HCC)   08/24/2017 Initial Diagnosis    Bone metastases (CMS-HCC)     11/23/2018 Endocrine/Hormone Therapy    OP LEUPROLIDE (ELIGARD) 45 MG EVERY 6 MONTHS  Plan Provider: Chrisandra Netters, MD         Vital Signs for this encounter:  BP 139/68  - Pulse 99  - Temp 36.3 ??C (97.4 ??F) (Temporal)  - Resp 18  - Ht 172.7 cm (5' 8)  - Wt 69.4 kg (153 lb)  - SpO2 99%  -  BMI 23.26 kg/m??   Wt Readings from Last 3 Encounters:   07/31/20 69.4 kg (153 lb)   12/31/19 81.6 kg (180 lb)   11/22/19 82.9 kg (182 lb 11.2 oz)       Medications:  Current Outpatient Medications   Medication Sig Dispense Refill   ??? allopurinoL (ZYLOPRIM) 100 MG tablet Take 1 tablet (100 mg total) by mouth daily. 90 tablet 3   ??? amLODIPine (NORVASC) 5 MG tablet Take 1 tablet by mouth daily.     ??? atorvastatin (LIPITOR) 10 MG tablet Take 10 mg by mouth nightly.      ??? carvediloL (COREG) 6.25 MG tablet Take 1 tablet (6.25 mg total) by mouth Two (2) times a day. 60 tablet 11   ??? darolutamide (NUBEQA) 300 mg tablet Take 2 tablets (600 mg total) by mouth 2 (two) times a day with meals. Take with food. Swallow tablets whole. 120 tablet 11   ??? ferrous sulfate 325 (65 FE) MG tablet Take 325 mg by mouth daily.     ??? oxyCODONE (ROXICODONE) 5 MG immediate release tablet Take 1 tablet (5 mg total) by mouth every eight (8) hours as needed for pain. 90 tablet 0   ??? polyethylene glycol (MIRALAX) 17 gram packet Take 17 g by mouth two (2) times a day as needed.       No current facility-administered medications for this visit.     Facility-Administered Medications Ordered in Other Visits   Medication Dose Route Frequency Provider Last Rate Last Admin   ??? leuprolide (6 month) (ELIGARD) 45 mg injection                LABS:  Lab Results   Component Value Date    WBC 4.9 04/03/2020    HGB 10.8 (L) 04/03/2020    HCT 33.8 (L) 04/03/2020    PLT 340 04/03/2020       Lab Results   Component Value Date    NA 139 07/31/2020    K 3.9 07/31/2020    CL 106 07/31/2020    CO2 27.0 07/31/2020    BUN 15 07/31/2020    CREATININE 1.11 (H) 07/31/2020    GLU 102 07/31/2020    CALCIUM 9.8 07/31/2020    MG 2.1 08/12/2017    PHOS 3.3 08/12/2017       Lab Results   Component Value Date    BILITOT 1.1 07/31/2020    PROT 7.2 07/31/2020    ALBUMIN 3.9 07/31/2020    ALT 12 07/31/2020    AST 23 07/31/2020    ALKPHOS 121 (H) 07/31/2020       Lab Results   Component Value Date    INR 1.41 02/17/2017    APTT 27.9 02/17/2017     I spent 20 minutes in direct patient care.    Laverna Peace PharmD, BCOP, CPP  Hematology/Oncology Pharmacist  P: [phone]

## 2020-08-04 MED FILL — NUBEQA 300 MG TABLET: 30 days supply | Qty: 120 | Fill #0 | Status: AC

## 2020-08-07 DIAGNOSIS — C7951 Secondary malignant neoplasm of bone: Principal | ICD-10-CM

## 2020-08-07 DIAGNOSIS — C61 Malignant neoplasm of prostate: Principal | ICD-10-CM

## 2020-08-07 NOTE — Unmapped (Signed)
In clinic on 07/31/20, [name] son expressed concerns of medication compliance and organization related to his father's medications. He states that [facility] companies would not help his father with medication organization and this is something he could strongly benefit from. I have touched base with several HH companies and [facility] works with patients regarding medication compliance and organization. I have placed a referral and let patient's son know that I have sent his father's information to be reviewed and hopefully he will be accepted.       Baxter Hire, RN

## 2020-08-20 NOTE — Unmapped (Signed)
[facility] Refill Coordination Note    Specialty Medication(s) to be Shipped:   Hematology/Oncology: Nubeqa    Other medication(s) to be shipped: No additional medications requested for fill at this time     Stephen Bartlett, DOB: 11/04/1940  Phone: [phone] (home)       All above HIPAA information was verified with patient's family member, Son.     Was a Nurse, learning disability used for this call? No    Completed refill call assessment today to schedule patient's medication shipment from the [facility] [phone]).       Specialty medication(s) and dose(s) confirmed: Regimen is correct and unchanged.   Changes to medications: Stephen Bartlett reports no changes at this time.  Changes to insurance: No  Questions for the pharmacist: No    Confirmed patient received Welcome Packet with first shipment. The patient will receive a drug information handout for each medication shipped and additional FDA Medication Guides as required.       DISEASE/MEDICATION-SPECIFIC INFORMATION        N/A    SPECIALTY MEDICATION ADHERENCE     Medication Adherence    Patient reported X missed doses in the last month: 0  Specialty Medication: Nubeqa 300mg   Patient is on additional specialty medications: No  Informant: child/children                Nubeqa 300 mg: 16 days of medicine on hand         SHIPPING     Shipping address confirmed in Epic.     Delivery Scheduled: Yes, Expected medication delivery date: 09/02/20.     Medication will be delivered via Next Day Courier to the prescription address in Epic [place].    [name]   [facility] Specialty Technician

## 2020-08-29 ENCOUNTER — Institutional Professional Consult (permissible substitution): Admit: 2020-08-29 | Discharge: 2020-08-30 | Payer: MEDICARE

## 2020-08-29 DIAGNOSIS — C61 Malignant neoplasm of prostate: Principal | ICD-10-CM

## 2020-08-29 DIAGNOSIS — G893 Neoplasm related pain (acute) (chronic): Principal | ICD-10-CM

## 2020-08-29 DIAGNOSIS — C7951 Secondary malignant neoplasm of bone: Principal | ICD-10-CM

## 2020-08-29 MED ORDER — OXYCODONE 5 MG TABLET
ORAL_TABLET | Freq: Three times a day (TID) | ORAL | 0 refills | 30 days | Status: CP | PRN
Start: 2020-08-29 — End: ?

## 2020-08-29 NOTE — Unmapped (Signed)
OUTPATIENT ONCOLOGY PALLIATIVE CARE    Principal Diagnosis: [name] is a 80 y.o. 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     Assessment/Plan:   1.  Upper back pain due to bone mets from prostate cancer-controlled with current regimen and no longer on decadron. Last dose decadron was on 01/24/20. (Trying to only use Decadron long-term for hospice patients).  -Continue oxycodone 5 mg every 8 hours as needed pain-averaging 2 tabs/day  -Discontinue ibuprofen.  Last set of labs showed a mild elevation of creatinine.  Has not been taking ibuprofen in the last several months.      2. Support- Did have Covid in 2020 and recovered.  Was able to get his flu vaccine for this season and both injections of the Covid vaccine.  He is waiting for the 14-month window to get the Covid booster which he still has not received.  Active with [facility] home [facility] and this has been beneficial.  Physical therapy has just signed off.  Appears to have better medication compliance.    -Receptive to [facility] home-based palliative care in addition to our team's for support.  Will connect with Allegra Lai RN for order placement.  -Encouraged covid booster. Does have a contact to get this scheduled.       3. Advanced care planning-not addressed today, but will connect with Dr. Genice Rouge to help facilitate advance care planning documentation for the March visit.    -At prior visits: still with no healthcare power of attorney or living will.   - Son-Stephen is receptive to having these completed.  Stephen works at a  [employer], water [employer].  Shared the prepare for your care form with his son via text.  -HCDM-son Stephen Bartlett.  -NP did discuss that in the state of [place], his wife will be his agent unless he does get this in writing.  -Patient wants his son Stephen Bartlett to be his primary agent, but he does not have advanced care planning documents.  His secondary agent would be his spouse.  -Discussed values and maintaining his independence and his relationship with his son are important to him.  His strengths include his religious faith and is grateful for his family and a primary goal is being able to live independently.      # Controlled substances risk management.   - Patient does not have a signed pain medication agreement with our team, but this was discussed verbally.   - [place]CCS[place] database was reviewed today and it was appropriate.   - Urine drug screen was not performed at this visit. Findings: not applicable.   - Patient has received information about safe storage and administration of medications.   - Patient has not received a prescription for narcan.      F/u: Clinic visit with Dr. Genice Rouge on 3/10 at 10:30 AM  ----------------------------------------  Referring Provider: Dr. [name]  Oncology Team: Dr. [name]  PCP: [name]      HPI: 80 year old male with castration resistant metastatic prostate cancer to bone diagnosed in 2013.  In January 2019, patient had cord compression and subsequent surgery & XRT with a rod from C7-T8.  His prior tx have been Radium 223, Avodart/Casodex and abiraterone/Lupron. Restaging scans 3/20 (post-Redium) are stable/slightly improved. He is also being followed for a neuroendocrine tumor grade 1 pancreatic mass.     Patient's primary symptom is his upper back pain which is exacerbated by changes in the weather.  He reports that the pain is currently  at 7/10, but he has not taken the a.m. ibuprofen 600 mg and the Decadron 2 mg.  He does find some relief with this.  Has some difficulty using the pain scores.  Feels that he still not getting adequate amount of relief.  He describes the pain as a numbing and aching sensation.  He had tried Tylenol in the past with no relief. Does endorse history of marijuana use.  No other illicit drug use and opioid risk assessment tool was done today.    Patient shared that when he had his spine surgery he could not walk and it took much time and energy for him to regain his strength and walk again. He still drives and is able to do some cooking in the home.  He is independent with his personal ADLs.    ??  Current cancer-directed therapy: ADT- Eligard 45 (6 month injection)  and  Darolutamide       Interval hx, 08/21/19-CK    Patient was hospitalized from 12/2-12/4 for complications from Covid.  He had acute hypoxic respiratory failure with mild diarrhea.  He was sent home with home oxygen and steroids.  Shares that he is hardly using the oxygen now.      Interval hx 08/29/20 phone visit with [name], his son Stephen Bartlett and CK    Shares that having the home health team from [facility] has been beneficial.  His blood pressure has been good.  He is still able to do cooking and his shopping.    Symptom Review:  General: Shares he is doing good.  Pain: No change to the upper back pain, overall stable.  Using oxycodone 5 mg usually 2 to 3 tablets/day.  Rain and cold weather exacerbates his pain and definitely uses a 3 tablets.    Fatigue: Energy good-remains active. Drives, shops and visits sister who lives locally.  Mobility: Able to do the things that he wants to do.  Does have a cane if he leaves the home.  Sleep: not assessed  Appetite: Describes as good.  Bowel function: Denies constipation or diarrhea.  Bowel movements are daily and has as needed MiraLAX if needed.  Dyspnea: Not assessed  Mood: Good, no issues with anxiety or depression.    Palliative Performance Scale: 80% - Ambulation: Full / Normal Activity with effort, some evidence of disease / Self-Care:Full / Intake: Normal or reduced / Level of Conscious: Full      Coping/Support Issues: Has the support of his spiritual practice.  He had been attending the [employer] on a regular basis prior to  COVID-19.  His most supportive people are his son, who is a Clinical research associate in [place] and his sister who is a Runner, broadcasting/film/video in [place].  He also has a sister who is a retired Charity fundraiser that lives about 1 hour away.  He does live with his wife since 7. It is a second marriage for him.    Goals of Care: To feel better    Social History:   Name of primary support: Son, wife and sister  Occupation: Retired.  He had worked in the [employer]  Hobbies: Enjoys visiting family and sitting on the front porch.  Current residence / distance from [facility]: lives in [place].  45 minutes from [facility].  Shares it is easy to get to the [facility] campus    Advance Care Planning:   HCPOA:  Natural surrogate decision maker: His son would be his primary agent and his wife  would be the secondary  Living Will: No  ACP note:   CODE STATUS: Full    Objective     Opioid Risk Tool:    Male  Male    Family history of substance abuse      Alcohol  1  3    Illegal drugs  2  3    Rx drugs  4  4    Personal history of substance abuse      Alcohol  3  3    Illegal drugs  4  4    Rx drugs  5  5    Age between 94???45 years  1  1    History of preadolescent sexual abuse  3  0    Psychological disease      ADD, OCD, bipolar, schizophrenia  2  2    Depression  1  1       Total: 0  (<3 low risk, 4-7 moderate risk, >8 high risk)    Oncology History   Malignant neoplasm of prostate (CMS-HCC)   09/30/2011 Initial Diagnosis    Malignant neoplasm of prostate (CMS-HCC)     11/23/2018 Endocrine/Hormone Therapy    OP LEUPROLIDE (ELIGARD) 45 MG EVERY 6 MONTHS  Plan Provider: Chrisandra Netters, MD     Bone metastases (CMS-HCC)   08/24/2017 Initial Diagnosis    Bone metastases (CMS-HCC)     11/23/2018 Endocrine/Hormone Therapy    OP LEUPROLIDE (ELIGARD) 45 MG EVERY 6 MONTHS  Plan Provider: Chrisandra Netters, MD         Patient Active Problem List   Diagnosis   ??? Malignant neoplasm of prostate (CMS-HCC)   ??? 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 Hypertension   ??? Calculus of kidney   ??? Acute renal failure (CMS-HCC)   ??? Foreign body in bladder and urethra   ??? Uric acid nephrolithiasis   ??? Renal cyst, acquired, left   ??? Pancreatic mass   ??? Acute pancreatitis   ??? Bronchiectasis (CMS-HCC)   ??? Acute kidney injury (CMS-HCC)   ??? Lesion of pancreas   ??? Bone metastases (CMS-HCC)       Past Medical History:   Diagnosis Date   ??? Calculus of kidney    ??? Hypertension    ??? Malignant neoplasm of prostate (CMS-HCC)        Past Surgical History:   Procedure Laterality Date   ??? PR ARTHRODESIS POSTERIOR/POSTERIORLATERAL CERVICAL BELOW C2 Midline 08/05/2017    Procedure: ARTHRODESIS, POSTERIOR OR POSTEROLATERAL TECHNIQUE, SINGLE LEVEL; CERVICAL BELOW C2 SEGMENT;  Surgeon: Timothy Lasso, MD;  Location: MAIN OR [facility];  Service: Ortho Spine   ??? PR ARTHRODESIS POSTERIOR/POSTEROLATERAL EA ADDL Midline 08/05/2017    Procedure: ARTHRODESIS, POSTERIOR OR POSTEROLATERAL TECHNIQUE, SINGLE LEVEL; EACH ADDITIONAL VERTEBRAL SEGMENT;  Surgeon: Timothy Lasso, MD;  Location: MAIN OR [facility];  Service: Ortho Spine   ??? PR LAMINEC/FACETECT/FORAMIN,CERVICAL 1 SEG Midline 08/05/2017    Procedure: LAMINECTOMY SNGL VERTEBRAL SEGMT-UNI/BIL; CERV;  Surgeon: Timothy Lasso, MD;  Location: MAIN OR [facility];  Service: Ortho Spine   ??? PR LAMINEC/FACETECT/FORAMIN,EACH ADDNL Midline 08/05/2017    Procedure: LAMINECTMY 1 SEGMT-UNI/BIL; EA ADD CERV/THOR/LUM;  Surgeon: Timothy Lasso, MD;  Location: MAIN OR [facility];  Service: Ortho Spine   ??? PR POSTERIOR SEGMENTAL INSTRUMENTATION 7-12 VRT SEG Midline 08/05/2017    Procedure: POSTERIOR SEGMENTAL INSTRUMENTATION; (EG, PEDICLE FIXATION, DUAL RODS W/MULT HOOKS/WIRES) 7-12 VERTEB SEGMT;  Surgeon: Timothy Lasso, MD;  Location: MAIN OR [facility];  Service: Ortho Spine   ??? PR UPGI ENDOSCOPY,FN NEEDLE BX,GUIDED N/A 02/11/2017    Procedure: UGI W/TRANSENDOSCOPIC US-GUIDE INTRA/TRANSMURAL NEEDLE ASP/BX (INCL EXAM ESOPHAGUS, STOMACH, DOUDENUM/JEJ);  Surgeon: Vonda Antigua, MD;  [facility];  Service: Gastroenterology   ??? URETEROSCOPY         Current Outpatient Medications   Medication Sig Dispense Refill   ??? allopurinoL (ZYLOPRIM) 100 MG tablet Take 1 tablet (100 mg total) by mouth daily. 90 tablet 3   ??? amLODIPine (NORVASC) 5 MG tablet Take 1 tablet by mouth daily.     ??? atorvastatin (LIPITOR) 10 MG tablet Take 10 mg by mouth nightly.      ??? carvediloL (COREG) 6.25 MG tablet Take 1 tablet (6.25 mg total) by mouth Two (2) times a day. 60 tablet 11   ??? darolutamide (NUBEQA) 300 mg tablet Take 2 tablets (600 mg total) by mouth 2 (two) times a day with meals. Take with food. Swallow tablets whole. 120 tablet 11   ??? ferrous sulfate 325 (65 FE) MG tablet Take 325 mg by mouth daily.     ??? oxyCODONE (ROXICODONE) 5 MG immediate release tablet Take 1 tablet (5 mg total) by mouth every eight (8) hours as needed for pain. 90 tablet 0   ??? polyethylene glycol (MIRALAX) 17 gram packet Take 17 g by mouth two (2) times a day as needed.       No current facility-administered medications for this visit.     Facility-Administered Medications Ordered in Other Visits   Medication Dose Route Frequency Provider Last Rate Last Admin   ??? leuprolide (6 month) (ELIGARD) 45 mg injection                Allergies: No Known Allergies    Family History:  Cancer-related family history includes Prostate cancer in an other family member. There is no history of Kidney cancer.  He indicated that the status of his mother is unknown. He indicated that the status of his neg hx is unknown. He indicated that the status of his other is unknown.        Lab Results   Component Value Date    CREATININE 1.11 (H) 07/31/2020     Lab Results   Component Value Date    ALKPHOS 121 (H) 07/31/2020    BILITOT 1.1 07/31/2020    PROT 7.2 07/31/2020    ALBUMIN 3.9 07/31/2020    ALT 12 07/31/2020    AST 23 07/31/2020       Pam Drown, FNP-BC, [facility]  Outpatient Oncology Palliative Care Service  [facility]  [address], [place], [place] [address]  [phone]      I spent 15 minutes on the phone with the patient. I spent an additional 10 minutes on pre- and post-visit activities.     The patient was physically located in [place] or a state in which I am permitted to provide care. The patient and/or parent/gauardian understood that s/he may incur co-pays and cost sharing, and agreed to the telemedicine visit. The visit was completed via phone and/or video, which was appropriate and reasonable under the circumstances given the patient's presentation at the time.    The patient and/or parent/guardian has been advised of the potential risks and limitations of this mode of treatment (including, but not limited to, the absence of in-person examination) and has agreed to be treated using telemedicine. The patient's/patient's family's questions regarding telemedicine  have been answered.     If the phone/video visit was completed in an ambulatory setting, the patient and/or parent/guardian has also been advised to contact their provider???s office for worsening conditions, and seek emergency medical treatment and/or call 911 if the patient deems either necessary.

## 2020-09-01 MED FILL — NUBEQA 300 MG TABLET: ORAL | 30 days supply | Qty: 120 | Fill #1

## 2020-09-02 DIAGNOSIS — C61 Malignant neoplasm of prostate: Principal | ICD-10-CM

## 2020-09-02 DIAGNOSIS — C7951 Secondary malignant neoplasm of bone: Principal | ICD-10-CM

## 2020-09-02 DIAGNOSIS — R5381 Other malaise: Principal | ICD-10-CM

## 2020-09-02 NOTE — Unmapped (Signed)
???  Referral faxed to [facility] home palliative care w/ request to co-manage pt with [facility] palliative care (our team will continue prescribing symptom management medications). Requests assistance of home based palliative care provider to coordinate care w/ [facility] pall care at [phone].???

## 2020-09-05 ENCOUNTER — Telehealth: Payer: Self-pay | Admitting: Nurse Practitioner

## 2020-09-05 NOTE — Telephone Encounter (Signed)
Spoke with patient regarding the Palliative referral/services and he was in agreement with scheduling visit.  I have scheduled an In-person Consult for 09/15/20 @ 11:30 AM.

## 2020-09-15 ENCOUNTER — Other Ambulatory Visit: Payer: Self-pay

## 2020-09-15 ENCOUNTER — Other Ambulatory Visit: Payer: Medicare HMO | Admitting: Nurse Practitioner

## 2020-09-15 ENCOUNTER — Encounter: Payer: Self-pay | Admitting: Nurse Practitioner

## 2020-09-15 DIAGNOSIS — Z515 Encounter for palliative care: Secondary | ICD-10-CM

## 2020-09-15 DIAGNOSIS — C61 Malignant neoplasm of prostate: Secondary | ICD-10-CM

## 2020-09-15 NOTE — Progress Notes (Signed)
[place] Consult Note Telephone: [phone]  Fax: [phone]  PATIENT NAME: [name] [name] DOB: 21-Sep-1940 MRN: [id number]  PRIMARY CARE PROVIDER:   Patient, No Pcp Per  REFERRING PROVIDER:  Yetta Flock, FNP [address] [place],  [place] [address]  RESPONSIBLE PARTY:   Self  I was asked to see [name] [name] for Palliative Care consult for complex medical decision making by Dr Claiborne Billings  Due to the COVID-19 crisis, this visit was done via telemedicine from my office and it was initiated and consent by this patient and or family  complex medical decision making. 1. Advance Care Planning; Full code, aggressive interventions   2. Goals of Care: Goals include to maximize quality of life and symptom management. Our advance care planning conversation included a discussion about:     The value and importance of advance care planning   Exploration of personal, cultural or spiritual beliefs that might influence medical decisions   Exploration of goals of care in the event of a sudden injury or illness   Identification and preparation of a healthcare agent   Review and updating or creation of an  advance directive document.   3. Palliative care encounter; Palliative care encounter; Palliative medicine team will continue to support patient, patient's family, and medical team. Visit consisted of counseling and education dealing with the complex and emotionally intense issues of symptom management and palliative care in the setting of serious and potentially life-threatening illness   4. f/u 1 month for ongoing monitoring chronic disease progression, ongoing discussions complex medical decision making  I spent 50 minutes providing this consultation,  from 11:30am to 12:20pm. More than 50% of the time in this consultation was spent coordinating communication.   HISTORY OF PRESENT ILLNESS:  [name] [name] is a 80 y.o. 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 Neuroendocrine tumor grade 1 pancreatic mass.Restaging scans 06/2019 and3/2020 have been stablepost-Radium. Stable scans, PSA has been rising, from 31 in 08/2018 to 60 in 05/2019, Prostatitis, hypertension, hyperlipidemia, history of back surgery. I called [name] [name] for initial schedule Palliative care home visit. Mr [name] requested to change in person visit to telemedicine telephonic is video not available. We talked about purpose of palliative care visit. Mr [name] in agreement. We talked about past medical history in the study of chronic disease and progression. We talked about cancer diagnosis with ongoing chemotherapy. We talked about medications. We talked about a medication box. Mr [name] endorsed is he does not like the medication boxes he would rather take them from the bottles. We talked about her review of chart there is some concern about  if he is taking them correctly. [name] [name] endorses home health nurse has been coming out and helping him with his medications. We talked about side effects for which Mr [name] endorses he does not seem to be experiencing. No nausea vomiting, shortness of breath. We talked about how he is feeling today. [name] [name] endorses he is over all very tired. He naps during the day frequently. We talked about sleeping at night which he has trouble with. We talked about symptoms of pain which is currently being managed on Oxycodone effectively. Mr [name] endorses he was told no longer take Ibuprofen. We talked about his appetite. Mr [name] endorses his appetite has been good. We talked about weight. We talked about last time cology  visit. We talked about family dynamics as he has one son from a previous marriage. Mr [name] is currently married to his second wife with whom he lives with. Mr [name] endorses his son  is an attorney and actively involved. We talked about health care power of attorney for which he wishes his son to be. Mr [name] endorses his son is working on documents will confirm one contact. We talked about life review. We talked about working in the [employer] for what he retired from. Mr [name] endorses it took him many many years to buy a house and pay it off as well as getting nice car. [name] [name] talked at length about his accomplishments working hard and saving his money. We talked about medical goals of care including aggressive versus conservative versus comfort care. We talked about code status is he currently is a full code. We talked about scenarios of CPR. Mr [name] endorses his wishes already continue with full aggressive interventions including CPR. We talked about role of Palliative care and plan of care. Mr [name] was in agreement to scheduling follow up Palliative care visit in 4 weeks in person for ongoing discussions of complex medical decision-making, assisting [facility] palliative care and managing pain which currently seems to be effective, monitoring weights, appetite, following up with medication management, Home Health. Mr [name] in agreement. Appointment scheduled. Therapeutic listening and emotional support provided. Contact information. Questions answered to satisfaction.  Weight 153 lbs 07/31/2020  Palliative Care was asked to help address goals of care.   CODE STATUS: Full code  PPS: 50% HOSPICE ELIGIBILITY/DIAGNOSIS: TBD  PAST MEDICAL HISTORY:  Past Medical History:  Diagnosis Date  . Hyperlipidemia   . Hypertension   . Prostatitis     SOCIAL HX:  Social History   Tobacco Use  . Smoking status: Never Smoker  . Smokeless tobacco: Never Used  Substance Use Topics  . Alcohol use: Yes    ALLERGIES: No Known Allergies   PERTINENT MEDICATIONS:  Outpatient Encounter Medications as of 09/15/2020  Medication Sig  . acetaminophen (TYLENOL) 325 MG tablet Take 2  tablets (650 mg total) by mouth every 6 (six) hours as needed for mild pain or headache (fever >/= 101).  [name] allopurinol (ZYLOPRIM) 100 MG tablet Take 100 mg by mouth daily.  [name] atorvastatin (LIPITOR) 10 MG tablet Take 10 mg by mouth daily at 6 PM.  . darolutamide (NUBEQA) 300 MG tablet Take 600 mg by mouth 2 (two) times daily.  . famotidine (PEPCID) 20 MG tablet Take 20 mg by mouth daily.  [name] oxyCODONE (OXY IR/ROXICODONE) 5 MG immediate release tablet Take 5 mg by mouth 2 (two) times daily as needed for pain.  . potassium chloride SA (KLOR-CON) 20 MEQ tablet Take 20 mEq by mouth daily.   No facility-administered encounter medications on file as of 09/15/2020.    PHYSICAL EXAM:  Dferred  Christin Ihor Gully, NP

## 2020-09-16 NOTE — Unmapped (Signed)
[facility] Refill Coordination Note    Specialty Medication(s) to be Shipped:   Hematology/Oncology: Nubeqa    Other medication(s) to be shipped: No additional medications requested for fill at this time     Stephen Bartlett, DOB: [birth year]  Phone: [phone] (home)       All above HIPAA information was verified with patient.     Was a Nurse, learning disability used for this call? No    Completed refill call assessment today to schedule patient's medication shipment from the [facility] [phone]).       Specialty medication(s) and dose(s) confirmed: Regimen is correct and unchanged.   Changes to medications: [name] reports no changes at this time.  Changes to insurance: No  Questions for the pharmacist: No    Confirmed patient received Welcome Packet with first shipment. The patient will receive a drug information handout for each medication shipped and additional FDA Medication Guides as required.       DISEASE/MEDICATION-SPECIFIC INFORMATION        N/A    SPECIALTY MEDICATION ADHERENCE     Medication Adherence    Patient reported X missed doses in the last month: 0  Specialty Medication: Nubeqa 300mg   Patient is on additional specialty medications: No  Informant: patient                Nubeqa 300 mg: 19 days of medicine on hand         SHIPPING     Shipping address confirmed in Epic.     Delivery Scheduled: Yes, Expected medication delivery date: 10/02/20.     Medication will be delivered via Next Day Courier to the prescription address in Epic [place].    [name]   [facility] Specialty Technician

## 2020-10-01 MED FILL — NUBEQA 300 MG TABLET: ORAL | 30 days supply | Qty: 120 | Fill #2

## 2020-10-07 ENCOUNTER — Encounter: Payer: Self-pay | Admitting: Nurse Practitioner

## 2020-10-07 ENCOUNTER — Other Ambulatory Visit: Payer: Self-pay

## 2020-10-07 ENCOUNTER — Other Ambulatory Visit: Payer: Medicare HMO | Admitting: Nurse Practitioner

## 2020-10-07 DIAGNOSIS — C61 Malignant neoplasm of prostate: Secondary | ICD-10-CM

## 2020-10-07 DIAGNOSIS — Z515 Encounter for palliative care: Secondary | ICD-10-CM

## 2020-10-07 NOTE — Progress Notes (Signed)
[place] Consult Note Telephone: [phone]  Fax: [phone]  PATIENT NAME: [name] [name] DOB: 17-May-1941 MRN: [id number]  PRIMARY CARE PROVIDER:  Yetta Flock, FNP [place],  [place] [address]  Due to the COVID-19 crisis, this visit was done via telemedicine from my office and it was initiated and consent by this patient and or family  complex medical decision making. 1.Advance Care Planning;Full code, aggressive interventions  2. Goals of Care: Goals include to maximize quality of life and symptom management. Our advance care planning conversation included a discussion about:   The value and importance of advance care planning  Exploration of personal, cultural or spiritual beliefs that might influence medical decisions  Exploration of goals of care in the event of a sudden injury or illness  Identification and preparation of a healthcare agent  Review and updating or creation of anadvance directive document.  3.Palliative care encounter; Palliative care encounter; Palliative medicine team will continue to support patient, patient's family, and medical team. Visit consisted of counseling and education dealing with the complex and emotionally intense issues of symptom management and palliative care in the setting of serious and potentially life-threatening illness  4. f/u2 month for ongoing monitoring chronic disease progression, ongoing discussions complex medical decision making  I spent 50 minutes providing this consultation,  From 9:00am to 9:50am. More than 50% of the time in this consultation was spent coordinating communication.   HISTORY OF PRESENT ILLNESS:  [name] [name] is a 80 y.o. 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 Neuroendocrine tumor  grade 1 pancreatic mass.Restaging scans 06/2019 and3/2020 have been stablepost-Radium. Stable scans, PSA has been rising, from 31 in 08/2018 to 60 in 05/2019, Prostatitis, hypertension, hyperlipidemia, history of back surgery. I called [name] [name] to confirm in person follow up Palliative care visit. [name] [name] endorses which is already changed to telemedicine telephonic as his sister recently passed and concern of exposure. Change to telemedicine telephonic as video not available per [name]. [name] request. [name]. [name] and I talked about the purpose of Palliative care visit. [name] [name] in agreement. We talked about how he has been feeling today. [name] [name] endorses he is a little tired but doing okay. [name]. [name] was getting ready to make himself breakfast, sausage and eggs. [name]. [name] endorses he is able to stand, ambulate with a walker or a cane with no recent falls. [name] [name] does perform ADL independently as well as cooks for himself. [name] [name] endorses his appetite has been good. [name]. [name] endorses does not appear to be  losing weight . [name]. [name] lives at home with his wife greater than 30 years. [name] [name] talked about his son [name] [name] who helps him with all of his Physician appointments. We talked about symptoms of pain which at present time [name] [name] endorses he is comfortable. [name]. [name] does get pain medications from Palliative care at [facility], effective with relief. No other symptoms of nausea, vomiting, shortness of breath. [name]. [name] endorses he does feel fatigued in week often. [name]. [name] is sleeping at night. Medical goals of care reviewed including code status. Wishes are for full code, aggressive interventions. :I want to live".  We talked about last Oncology visit. We talked about medical goals of care. We talked about role of Palliative care and plan of care. Discuss will follow up in 8 weeks if  needed or sooner if declines. [name] [name] in agreement, appointments scheduled. Therapeutic listening, emotional  support provided. Questions answered to satisfaction. I called [name]. [name] son [name] [name]. Update giving on and Palliative care visit. [name] [name] endorses purpose of Palliative care in-home visit was for medication organization and Medbox feeling. Explained that that would be a skill for home health nursing and can recommend to primary for home health referral. Also option to bubble pack his medications through the pharmacy for which [name] [name] was going to check on. We talked about role of in-home Palliative care in collaboration with [facility] Palliative care. We talked about [name]. Yoshida wishing to change the telemedicine as with the recent passing of his sister. Questions answered to satisfaction. Contact information provided. No new changes recommended at present time,  continue current plan of care.  Palliative Care was asked to help to continue to address goals of care.   CODE STATUS: full code  PPS: 50% HOSPICE ELIGIBILITY/DIAGNOSIS: TBD  PAST MEDICAL HISTORY:  Past Medical History:  Diagnosis Date  . Hyperlipidemia   . Hypertension   . Prostatitis     SOCIAL HX:  Social History   Tobacco Use  . Smoking status: Never Smoker  . Smokeless tobacco: Never Used  Substance Use Topics  . Alcohol use: Yes    ALLERGIES: No Known Allergies   PERTINENT MEDICATIONS:  Outpatient Encounter Medications as of 10/07/2020  Medication Sig  . acetaminophen (TYLENOL) 325 MG tablet Take 2 tablets (650 mg total) by mouth every 6 (six) hours as needed for mild pain or headache (fever >/= 101).  [name] allopurinol (ZYLOPRIM) 100 MG tablet Take 100 mg by mouth daily.  [name] atorvastatin (LIPITOR) 10 MG tablet Take 10 mg by mouth daily at 6 PM.  . darolutamide (NUBEQA) 300 MG tablet Take 600 mg by mouth 2 (two) times daily.  . famotidine (PEPCID) 20 MG tablet Take 20 mg by mouth daily.  [name] oxyCODONE (OXY IR/ROXICODONE) 5 MG immediate release tablet Take 5 mg by mouth 2 (two) times daily as needed for pain.  . potassium chloride SA  (KLOR-CON) 20 MEQ tablet Take 20 mEq by mouth daily.   No facility-administered encounter medications on file as of 10/07/2020.    PHYSICAL EXAM:   Deferred  Kaja Jackowski Z Simmie Garin, NP

## 2020-10-15 NOTE — Unmapped (Signed)
[facility] Refill Coordination Note    Specialty Medication(s) to be Shipped:   Hematology/Oncology: Nubeqa    Other medication(s) to be shipped: No additional medications requested for fill at this time     Stephen Bartlett, DOB: 1941-01-01  Phone: [phone] (home)       All above HIPAA information was verified with patient.     Was a Nurse, learning disability used for this call? No    Completed refill call assessment today to schedule patient's medication shipment from the [facility] [phone]).       Specialty medication(s) and dose(s) confirmed: Regimen is correct and unchanged.   Changes to medications: Stephen Bartlett reports no changes at this time.  Changes to insurance: No  Questions for the pharmacist: No    Confirmed patient received Welcome Packet with first shipment. The patient will receive a drug information handout for each medication shipped and additional FDA Medication Guides as required.       DISEASE/MEDICATION-SPECIFIC INFORMATION        N/A    SPECIALTY MEDICATION ADHERENCE     Medication Adherence    Patient reported X missed doses in the last month: 0  Specialty Medication: Nubeqa 300mg   Patient is on additional specialty medications: No  Informant: patient                Nubeqa 300 mg: 20 days of medicine on hand          SHIPPING     Shipping address confirmed in Epic.     Delivery Scheduled: Yes, Expected medication delivery date: 10/31/20.     Medication will be delivered via Next Day Courier to the prescription address in [employer] [place].    Stephen Bartlett M Elisabeth Cara   [facility] Specialty Technician

## 2020-10-29 NOTE — Unmapped (Signed)
OUTPATIENT ONCOLOGY PALLIATIVE CARE    Principal Diagnosis: Stephen Bartlett is a 80 y.o. 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     Assessment/Plan:   1.  Upper back pain due to bone mets from prostate cancer-controlled with current regimen and no longer on decadron. Last dose decadron was on 01/24/20. (Trying to only use Decadron long-term for hospice patients).  Pain has been adequately controlled on oxycodone as needed.  -Continue oxycodone 5 mg every 8 hours as needed pain-averaging 2-3 tabs/day, new prescription to be written today  -Discontinue ibuprofen.  Last set of labs showed a mild elevation of creatinine.  Has not been taking ibuprofen in the last several months.      2. Support- Did have Covid in 2020 and recovered.  Was able to get his flu vaccine for this season and both injections of the Covid vaccine.  He is waiting for the 61-month window to get the Covid booster which he still has not received.  Active with [facility] and this has been beneficial.  Physical therapy has just signed off.  Appears to have better medication compliance.    -Receptive to [facility] home-based palliative care in addition to our team's for support.  Will connect with Allegra Lai RN for order placement.  -Encouraged covid booster. Does have a contact to get this scheduled.       3. Advanced care planning- Introduced idea of advance care planning today.  Son Stephen Bartlett expressed clear desire to be straight forward into the point.  Currently they do not feel like talking about use and hypothetical situations is particularly helpful.  He states he is a Clinical research associate that frequently does advance directives and he will plan to complete these with his father.  Introduced the idea that if/when we are at a point where certain decisions need to be made we are available to have these conversations.    After our visit with patient and his son, conversation held with Dr. [name] and unfortunately his cancer continues to progress, and further cancer directed treatment unlikely to provide significantly more benefit.  Recommendation for hospice in the near future likely appropriate.  We will remain available to help support these conversations if helpful.    -At prior visits: still with no healthcare power of attorney or living will.   - Son-Stephen Bartlett is receptive to having these completed.  Stephen Bartlett works at a  [employer], water [employer].  Shared the prepare for your care form with his son via text.  -HCDM-son Stephen Bartlett.  -NP did discuss that in the state of [place], his wife will be his agent unless he does get this in writing.  -Patient wants his son Stephen Bartlett to be his primary agent, but he does not have advanced care planning documents.  His secondary agent would be his spouse.  -Discussed values and maintaining his independence and his relationship with his son are important to him.  His strengths include his religious faith and is grateful for his family and a primary goal is being able to live independently.      # Controlled substances risk management.   - Patient does not have a signed pain medication agreement with our team, but this was discussed verbally.   - [place]CC[place] database was reviewed today and it was appropriate.   - Urine drug screen was not performed at this visit. Findings: not applicable.   - Patient has received information about safe storage and administration of medications.   -  Patient has not received a prescription for narcan.      F/u: As needed  ----------------------------------------  Referring Provider: Dr. [name]  Oncology Team: Dr. 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  In January 2019, patient had cord compression and subsequent surgery & XRT with a rod from C7-T8.  His prior tx have been Radium 223, Avodart/Casodex and abiraterone/Lupron. Restaging scans 3/20 (post-Redium) are stable/slightly improved. He is also being followed for a neuroendocrine tumor grade 1 pancreatic mass.     Patient's primary symptom is his upper back pain which is exacerbated by changes in the weather.  He reports that the pain is currently at 7/10, but he has not taken the a.m. ibuprofen 600 mg and the Decadron 2 mg.  He does find some relief with this.  Has some difficulty using the pain scores.  Feels that he still not getting adequate amount of relief.  He describes the pain as a numbing and aching sensation.  He had tried Tylenol in the past with no relief. Does endorse history of marijuana use.  No other illicit drug use and opioid risk assessment tool was done today.    Patient shared that when he had his spine surgery he could not walk and it took much time and energy for him to regain his strength and walk again. He still drives and is able to do some cooking in the home.  He is independent with his personal ADLs.    ??  Current cancer-directed therapy: ADT- Eligard 45 (6 month injection)  and  Darolutamide       Interval hx: Overall he states that he is feeling pretty good.  Feels like his pain is generally well controlled.  Describes it as pain in his back that is from a rod he had placed a while ago.  Typically takes 1 to 2 tablets 2-3 times a day, but sometimes does not need to take any.  He prefers to try and minimize medications.  Having regular bowel movements.  Typically tries to eat a lot of greens uses MiraLAX 1-2 times a day.  Generally has a bowel movement once a day.    Feels like his mood is good.  Describes things as being up to God and that what ever happens is going to happen.  States that nobody wants to die but he understands that he has cancer and this is a possibility.    Symptom Review:  General: Overall doing fairly well.  Pain: See above  Fatigue: Energy good-remains active. Drives, shops and visits sister who lives locally.  Mobility: Able to do the things that he wants to do.  Does have a cane if he leaves the home.  Sleep: not assessed  Appetite: Describes as good.  Bowel function: See above  Dyspnea: None  Mood: Good, no issues with anxiety or depression.    Palliative Performance Scale: 80% - Ambulation: Full / Normal Activity with effort, some evidence of disease / Self-Care:Full / Intake: Normal or reduced / Level of Conscious: Full      Coping/Support Issues: Has the support of his spiritual practice.  He had been attending the [employer] on a regular basis prior to  COVID-19.  His most supportive people are his son, who is a Clinical research associate in [place] and his sister who is a Runner, broadcasting/film/video in [place].  He also has a sister who is  a retired Charity fundraiser that lives about 1 hour away.  He does live with his wife since 59.  It is a second marriage for him.    Goals of Care: To feel better    Social History:   Name of primary support: Son, wife and sister  Occupation: Retired.  He had worked in the [employer]  Hobbies: Enjoys visiting family and sitting on the front porch.  Current residence / distance from [facility]: lives in [place].  45 minutes from [facility].  Shares it is easy to get to the [facility]    Advance Care Planning:   HCPOA:  Natural surrogate decision maker: His son would be his primary agent and his wife would be the secondary  Living Will: No  ACP note:   CODE STATUS: Full    Objective     Opioid Risk Tool:    Male  Male    Family history of substance abuse      Alcohol  1  3    Illegal drugs  2  3    Rx drugs  4  4    Personal history of substance abuse      Alcohol  3  3    Illegal drugs  4  4    Rx drugs  5  5    Age between 68???45 years  1  1    History of preadolescent sexual abuse  3  0    Psychological disease      ADD, OCD, bipolar, schizophrenia  2  2    Depression  1  1       Total: 0  (<3 low risk, 4-7 moderate risk, >8 high risk)    Oncology History   Malignant neoplasm of prostate (CMS-HCC)   09/30/2011 Initial Diagnosis    Malignant neoplasm of prostate (CMS-HCC) 11/23/2018 Endocrine/Hormone Therapy    OP LEUPROLIDE (ELIGARD) 45 MG EVERY 6 MONTHS  Plan Provider: Chrisandra Netters, MD     Bone metastases (CMS-HCC)   08/24/2017 Initial Diagnosis    Bone metastases (CMS-HCC)     11/23/2018 Endocrine/Hormone Therapy    OP LEUPROLIDE (ELIGARD) 45 MG EVERY 6 MONTHS  Plan Provider: Chrisandra Netters, MD         Patient Active Problem List   Diagnosis   ??? Malignant neoplasm of prostate (CMS-HCC)   ??? 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 Hypertension   ??? Calculus of kidney   ??? Acute renal failure (CMS-HCC)   ??? Foreign body in bladder and urethra   ??? Uric acid nephrolithiasis   ??? Renal cyst, acquired, left   ??? Pancreatic mass   ??? Acute pancreatitis   ??? Bronchiectasis (CMS-HCC)   ??? Acute kidney injury (CMS-HCC)   ??? Lesion of pancreas   ??? Bone metastases (CMS-HCC)       Past Medical History:   Diagnosis Date   ??? Calculus of kidney    ??? Hypertension    ??? Malignant neoplasm of prostate (CMS-HCC)        Past Surgical History:   Procedure Laterality Date   ??? PR ARTHRODESIS POSTERIOR/POSTERIORLATERAL CERVICAL BELOW C2 Midline 08/05/2017    Procedure: ARTHRODESIS, POSTERIOR OR POSTEROLATERAL TECHNIQUE, SINGLE LEVEL; CERVICAL BELOW C2 SEGMENT;  Surgeon: Timothy Lasso, MD;  Location: MAIN OR [facility];  Service: Ortho Spine   ??? PR ARTHRODESIS POSTERIOR/POSTEROLATERAL EA ADDL Midline 08/05/2017    Procedure: ARTHRODESIS, POSTERIOR OR POSTEROLATERAL TECHNIQUE, SINGLE LEVEL;  EACH ADDITIONAL VERTEBRAL SEGMENT;  Surgeon: Timothy Lasso, MD;  Location: MAIN OR [facility];  Service: Ortho Spine   ??? PR LAMINEC/FACETECT/FORAMIN,CERVICAL 1 SEG Midline 08/05/2017    Procedure: LAMINECTOMY SNGL VERTEBRAL SEGMT-UNI/BIL; CERV;  Surgeon: Timothy Lasso, MD;  Location: MAIN OR [facility];  Service: Ortho Spine   ??? PR LAMINEC/FACETECT/FORAMIN,EACH ADDNL Midline 08/05/2017    Procedure: LAMINECTMY 1 SEGMT-UNI/BIL; EA ADD CERV/THOR/LUM;  Surgeon: Timothy Lasso, MD;  Location: MAIN OR [facility];  Service: Ortho Spine   ??? PR POSTERIOR SEGMENTAL INSTRUMENTATION 7-12 VRT SEG Midline 08/05/2017    Procedure: POSTERIOR SEGMENTAL INSTRUMENTATION; (EG, PEDICLE FIXATION, DUAL RODS W/MULT HOOKS/WIRES) 7-12 VERTEB SEGMT;  Surgeon: Timothy Lasso, MD;  Location: MAIN OR [facility];  Service: Ortho Spine   ??? PR UPGI ENDOSCOPY,FN NEEDLE BX,GUIDED N/A 02/11/2017    Procedure: UGI W/TRANSENDOSCOPIC US-GUIDE INTRA/TRANSMURAL NEEDLE ASP/BX (INCL EXAM ESOPHAGUS, STOMACH, DOUDENUM/JEJ);  Surgeon: Vonda Antigua, MD;  [facility];  Service: Gastroenterology   ??? URETEROSCOPY         Current Outpatient Medications   Medication Sig Dispense Refill   ??? allopurinoL (ZYLOPRIM) 100 MG tablet Take 1 tablet (100 mg total) by mouth daily. 90 tablet 3   ??? amLODIPine (NORVASC) 5 MG tablet Take 1 tablet by mouth daily.     ??? atorvastatin (LIPITOR) 10 MG tablet Take 10 mg by mouth nightly.      ??? carvediloL (COREG) 6.25 MG tablet Take 1 tablet (6.25 mg total) by mouth Two (2) times a day. 60 tablet 11   ??? darolutamide (NUBEQA) 300 mg tablet Take 2 tablets (600 mg total) by mouth 2 (two) times a day with meals. Take with food. Swallow tablets whole. 120 tablet 11   ??? ferrous sulfate 325 (65 FE) MG tablet Take 325 mg by mouth daily.     ??? oxyCODONE (ROXICODONE) 5 MG immediate release tablet Take 1 tablet (5 mg total) by mouth every eight (8) hours as needed for pain. 90 tablet 0   ??? polyethylene glycol (MIRALAX) 17 gram packet Take 17 g by mouth two (2) times a day as needed.       No current facility-administered medications for this visit.     Facility-Administered Medications Ordered in Other Visits   Medication Dose Route Frequency Provider Last Rate Last Admin   ??? leuprolide (6 month) (ELIGARD) 45 mg injection                Allergies: No Known Allergies    Family History:  Cancer-related family history includes Prostate cancer in an other family member. There is no history of Kidney cancer.  He indicated that the status of his mother is unknown. He indicated that the status of his neg hx is unknown. He indicated that the status of his other is unknown.        Lab Results   Component Value Date    CREATININE 1.11 (H) 07/31/2020     Lab Results   Component Value Date    ALKPHOS 121 (H) 07/31/2020    BILITOT 1.1 07/31/2020    PROT 7.2 07/31/2020    ALBUMIN 3.9 07/31/2020    ALT 12 07/31/2020    AST 23 07/31/2020       I personally spent 30 minutes face-to-face and non-face-to-face in the care of this patient, which includes all pre, intra, and post visit time on the date of service.     Kathlynn Grate, MD  [facility] Outpatient Oncology  Palliative Care

## 2020-10-30 ENCOUNTER — Ambulatory Visit: Admit: 2020-10-30 | Discharge: 2020-10-30 | Payer: MEDICARE | Attending: Medical Oncology | Primary: Medical Oncology

## 2020-10-30 ENCOUNTER — Ambulatory Visit
Admit: 2020-10-30 | Discharge: 2020-10-30 | Payer: MEDICARE | Attending: Student in an Organized Health Care Education/Training Program | Primary: Student in an Organized Health Care Education/Training Program

## 2020-10-30 ENCOUNTER — Other Ambulatory Visit: Admit: 2020-10-30 | Discharge: 2020-10-30 | Payer: MEDICARE

## 2020-10-30 DIAGNOSIS — Z7189 Other specified counseling: Principal | ICD-10-CM

## 2020-10-30 DIAGNOSIS — Z515 Encounter for palliative care: Principal | ICD-10-CM

## 2020-10-30 DIAGNOSIS — C61 Malignant neoplasm of prostate: Principal | ICD-10-CM

## 2020-10-30 DIAGNOSIS — G893 Neoplasm related pain (acute) (chronic): Principal | ICD-10-CM

## 2020-10-30 LAB — CBC W/ AUTO DIFF
BASOPHILS ABSOLUTE COUNT: 0 10*9/L (ref 0.0–0.1)
BASOPHILS RELATIVE PERCENT: 0.6 %
EOSINOPHILS ABSOLUTE COUNT: 0.2 10*9/L (ref 0.0–0.5)
EOSINOPHILS RELATIVE PERCENT: 4.4 %
HEMATOCRIT: 30.8 % — ABNORMAL LOW (ref 39.0–48.0)
HEMOGLOBIN: 10.5 g/dL — ABNORMAL LOW (ref 12.9–16.5)
LYMPHOCYTES ABSOLUTE COUNT: 1.8 10*9/L (ref 1.1–3.6)
LYMPHOCYTES RELATIVE PERCENT: 35.9 %
MEAN CORPUSCULAR HEMOGLOBIN CONC: 34.1 g/dL (ref 32.0–36.0)
MEAN CORPUSCULAR HEMOGLOBIN: 31 pg (ref 25.9–32.4)
MEAN CORPUSCULAR VOLUME: 90.9 fL (ref 77.6–95.7)
MEAN PLATELET VOLUME: 7.9 fL (ref 6.8–10.7)
MONOCYTES ABSOLUTE COUNT: 0.6 10*9/L (ref 0.3–0.8)
MONOCYTES RELATIVE PERCENT: 11.5 %
NEUTROPHILS ABSOLUTE COUNT: 2.3 10*9/L (ref 1.8–7.8)
NEUTROPHILS RELATIVE PERCENT: 47.6 %
PLATELET COUNT: 215 10*9/L (ref 150–450)
RED BLOOD CELL COUNT: 3.39 10*12/L — ABNORMAL LOW (ref 4.26–5.60)
RED CELL DISTRIBUTION WIDTH: 15.2 % (ref 12.2–15.2)
WBC ADJUSTED: 4.9 10*9/L (ref 3.6–11.2)

## 2020-10-30 LAB — COMPREHENSIVE METABOLIC PANEL
ALBUMIN: 3.9 g/dL (ref 3.4–5.0)
ALKALINE PHOSPHATASE: 150 U/L — ABNORMAL HIGH (ref 46–116)
ALT (SGPT): 10 U/L (ref 10–49)
ANION GAP: 6 mmol/L (ref 5–14)
AST (SGOT): 19 U/L (ref <=34)
BILIRUBIN TOTAL: 0.7 mg/dL (ref 0.3–1.2)
BLOOD UREA NITROGEN: 24 mg/dL — ABNORMAL HIGH (ref 9–23)
BUN / CREAT RATIO: 21
CALCIUM: 9.7 mg/dL (ref 8.7–10.4)
CHLORIDE: 107 mmol/L (ref 98–107)
CO2: 26 mmol/L (ref 20.0–31.0)
CREATININE: 1.17 mg/dL — ABNORMAL HIGH
EGFR CKD-EPI AA MALE: 69 mL/min/{1.73_m2} (ref >=60)
EGFR CKD-EPI NON-AA MALE: 59 mL/min/{1.73_m2} — ABNORMAL LOW (ref >=60)
GLUCOSE RANDOM: 93 mg/dL (ref 70–179)
POTASSIUM: 3.9 mmol/L (ref 3.5–5.1)
PROTEIN TOTAL: 7.3 g/dL (ref 5.7–8.2)
SODIUM: 139 mmol/L (ref 135–145)

## 2020-10-30 LAB — PSA: PROSTATE SPECIFIC ANTIGEN: 27.63 ng/mL — ABNORMAL HIGH (ref 0.00–4.00)

## 2020-10-30 LAB — TESTOSTERONE: TESTOSTERONE TOTAL: <7 ng/dL — ABNORMAL LOW

## 2020-10-30 MED FILL — NUBEQA 300 MG TABLET: ORAL | 30 days supply | Qty: 120 | Fill #3

## 2020-10-30 NOTE — Unmapped (Signed)
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  Also has neuroendocrine tumor grade 1 pancreatic mass.  Restaging scans 06/2019 and 10/2018 were stable post-Radium.  He started darolutamide (held briefly during a hospitalization for COVID-19 in late 2020).  PSA was 31 in 08/2018 to 60 in 05/2019, then 51.5 on 06/28/2019 and 45.9 on 09/13/19 and 34.5 on 11/22/19 and down to 17.76 on 04/03/20 with castrate testosterone levels.  Today continues to rise.  He has limited systemic options available, as he likely would not tolerate chemotherapy.    Lab Results   Component Value Date    PSA 27.63 (H) 10/30/2020    PSA 20.65 (H) 07/31/2020    PSA 17.76 (H) 04/03/2020    PSA 34.50 (H) 11/22/2019    PSA 45.90 (H) 09/13/2019    PSA 51.50 (H) 06/28/2019       He has had challenges with organizing medications at home.  He lives with his wife who works and is not able to help with this, and his son lives in [place] but is becoming more involved.  A referral was made to Home Health to assist.  There has been some question about his compliance with treatment, as he has had excess drug remaining at times of refills.  He has been counseled multiple times by Pharmacy, and Pharmacy and I will again counsel him today and review meds.    He is followed by Palliative Care for back pain in his back at the area of prior surgery where he has a rod.  He is on Oxycodone.  He has few treatment options, and he understands the goals of care are supportive and palliative.  He has done advanced care planning with Palliative Care.    Plan:  - Continue ADT; Eligard every 52-months (07/31/2020).  - Continue darolutamide.  - Follow PSA.  - Blood pressure control with Katina Degree as needed.  - Followed by Palliative Care for pain control, thank you.  - Prior increased frequency of urination:  Better since stopping tamsulsoin. PVR has been low.  - LEE, improved with diuretic with his PCP -- he will follow up with PCP about this.  - Follow PSA.  - LEE: Diuretic as needed with his PCP and Palliative Care.  - Code status: he is preparing a living will and will let us know the details.      Follow up with me in 3 months for labs and follow up, and Eligard.    ---------------------------------------------------------    HPI:  - Prior patient of Dr. Verl Bangs  - 2013: Presented with metastatic prostate cancer to bone, PSA ~900, started Lupron, then Avodart/Casodex, then abiraterone/Lupron  - 1/19: Experienced cord compression while on Lupron/abiraterone --> RT  - Neuroendocrine tumor grade 1 pancreatic mass will be observed.  - 08/11/17: Bone scan: c/w 5/18, stable metastatic disease in the thoracic spine and right hemipelvis.  Redemonstration of radiotracer uptake in the upper thoracic vertebral body. Slightly decreased uptake within the right hemipelvis, consistent with previously seen mixed sclerotic/lytic lesions. No new areas of radiotracer uptake. Physiologic uptake is seen in the kidneys and bladder.  - 09/08/17: CT A/P: 1. Interval improvement in previously visualized pancreatic tail stranding and free fluid with lobulated intermediate density peripancreatic collection along the pancreatic tail, closely abutting the anterior aspect of the spleen. Findings may represent  complex pseudocyst in the setting of prior pancreatitis. MRI MRCP is recommended for further evaluation as known neuroendocrine pancreatic tumor is not well delineated on this portal venous phase CT and to exclude postcontrast enhancement of the suspected complex pancreatic pseudocyst along the tail.  2. Right lower lobe bronchiectasis with mucous plugging.  3. Hepatic hypodensities, unchanged and favored to represent hepatic cysts. This may be also be confirmed by MRI.  2/19: Discontinued abiraterone  6-11/19: Radium-223  11/02/18: CT A/P: No new lytic or blastic osseous lesions. Similar appearance of mixed sclerotic and lytic lesion in the right hemipelvis.  11/02/18: Bone Scan: Slightly decrease in radiotracer uptake involving the thoracic spine and right hemipelvis when compared to prior imaging.  05/31/19: PSA 60.4  06/25/19: CT A/P: Similar appearance of mixed sclerotic and lytic lesions in the right hemipelvis. No new lytic or blastic lesions.  Similar appearance of hyperenhancing pancreatic body mass, compatible with pancreatic neuroendocrine tumor.  Decreased size of fluid collection adjacent to the pancreatic tail/inferior margin of the spleen.  Bronchiectasis with mucous plugging in the right lower lobe, similar to prior.  06/25/19: Similar focal increase in radiotracer uptake within the right hemipelvis when compared to prior bone scan from 11/02/2018. Stable focal uptake in the upper thoracic vertebral bodies.    ROS: Feeling well today. Pain is stable, no new pain. Urinary frequency better since stopping tamsulosin.    Current Outpatient Medications on File Prior to Visit   Medication Sig Dispense Refill   ??? allopurinoL (ZYLOPRIM) 100 MG tablet Take 1 tablet (100 mg total) by mouth daily. 90 tablet 3   ??? amLODIPine (NORVASC) 5 MG tablet Take 1 tablet by mouth daily.     ??? atorvastatin (LIPITOR) 10 MG tablet Take 10 mg by mouth nightly.      ??? darolutamide (NUBEQA) 300 mg tablet Take 2 tablets (600 mg total) by mouth 2 (two) times a day with meals. Take with food. Swallow tablets whole. 120 tablet 11   ??? ferrous sulfate 325 (65 FE) MG tablet Take 325 mg by mouth daily.     ??? oxyCODONE (ROXICODONE) 5 MG immediate release tablet Take 1 tablet (5 mg total) by mouth every eight (8) hours as needed for pain. 90 tablet 0   ??? polyethylene glycol (MIRALAX) 17 gram packet Take 17 g by mouth two (2) times a day as needed.     ??? carvediloL (COREG) 6.25 MG tablet Take 1 tablet (6.25 mg total) by mouth Two (2) times a day. 60 tablet 11     Current Facility-Administered Medications on File Prior to Visit   Medication Dose Route Frequency Provider Last Rate Last Admin   ??? leuprolide (6 month) (ELIGARD) 45 mg injection                PE:  BP 135/75  - Pulse 69  - Temp 36.9 ??C (98.4 ??F) (Oral)  - Resp 16  - Ht 167.6 cm (5' 6)  - Wt 65.5 kg (144 lb 6.4 oz)  - SpO2 99%  - BMI 23.31 kg/m??   NAD  A+Ox3  No rash  Abd ND    Data:    PSA   Date Value Ref Range Status   10/30/2020 27.63 (H) 0.00 - 4.00 ng/mL Final   07/31/2020 20.65 (H) 0.00 - 4.00 ng/mL Final   04/03/2020 17.76 (H) 0.00 - 4.00 ng/mL Final   11/22/2019 34.50 (H) 0.00 - 4.00 ng/mL Final   09/13/2019 45.90 (H) 0.00 -  4.00 ng/mL Final   06/28/2019 51.50 (H) 0.00 - 4.00 ng/mL Final   05/31/2019 60.40 (H) 0.00 - 4.00 ng/mL Final   08/31/2018 31.30 (H) 0.00 - 4.00 ng/mL Final   07/06/2018 35.00 (H) 0.00 - 4.00 ng/mL Final   06/01/2018 33.10 (H) 0.00 - 4.00 ng/mL Final   05/04/2018 42.00 (H) 0.00 - 4.00 ng/mL Final   03/30/2018 42.70 (H) 0.00 - 4.00 ng/mL Final   01/26/2018 46.20 (H) 0.00 - 4.00 ng/mL Final   08/25/2017 61.50 (H) 0.00 - 4.00 ng/mL Final   08/08/2017 80.90 (H) 0.00 - 4.00 ng/mL Final   05/19/2017 34.70 (H) 0.00 - 4.00 ng/mL Final   02/10/2017 30.50 (H) 0.00 - 4.00 ng/mL Final   01/20/2017 19.60 (H) 0.00 - 4.00 ng/mL Final   01/07/2017 18.70 (H) 0.00 - 4.00 ng/mL Final   12/22/2016 19.40 (H) 0.00 - 4.00 ng/mL Final   12/03/2016 31.70 (H) 0.00 - 4.00 ng/mL Final   11/04/2016 30.30 (H) 0.00 - 4.00 ng/mL Final   08/06/2016 18.80 (H) 0.00 - 4.00 ng/mL Final   05/06/2016 24.10 (H) 0.00 - 4.00 ng/mL Final   01/29/2016 12.50 (H) 0.00 - 4.00 ng/mL Final   10/30/2015 11.10 (H) 0.00 - 4.00 ng/mL Final   07/24/2015 7.41 (H) 0.00 - 4.00 ng/mL Final   04/03/2015 3.44 0.00 - 4.00 ng/mL Final   12/23/2014 12.50 (H) 0.00 - 4.00 ng/mL Final

## 2020-10-30 NOTE — Unmapped (Signed)
-----   Message from Karie Georges, RN sent at 10/30/2020 11:02 AM EST -----  Regarding: refill on Oxy  Hi Cindy:    Patient is requesting a refill on his oxycodone.     Thanks!    Baxter Hire

## 2020-10-31 DIAGNOSIS — C61 Malignant neoplasm of prostate: Principal | ICD-10-CM

## 2020-10-31 DIAGNOSIS — C7951 Secondary malignant neoplasm of bone: Principal | ICD-10-CM

## 2020-10-31 DIAGNOSIS — G893 Neoplasm related pain (acute) (chronic): Principal | ICD-10-CM

## 2020-10-31 MED ORDER — OXYCODONE 5 MG TABLET
ORAL_TABLET | Freq: Three times a day (TID) | ORAL | 0 refills | 30 days | Status: CP | PRN
Start: 2020-10-31 — End: ?

## 2020-11-18 ENCOUNTER — Other Ambulatory Visit: Payer: Medicare HMO | Admitting: Nurse Practitioner

## 2020-12-01 NOTE — Unmapped (Signed)
[facility] Clinical Assessment & Refill Coordination Note    Stephen Bartlett, DOB: 11/14/1940  Phone: [phone] (home)     All above HIPAA information was verified with patient.     Was a Nurse, learning disability used for this call? No    Specialty Medication(s):   Hematology/Oncology: Nubeqa     Current Outpatient Medications   Medication Sig Dispense Refill   ??? allopurinoL (ZYLOPRIM) 100 MG tablet Take 1 tablet (100 mg total) by mouth daily. 90 tablet 3   ??? amLODIPine (NORVASC) 5 MG tablet Take 1 tablet by mouth daily.     ??? atorvastatin (LIPITOR) 10 MG tablet Take 10 mg by mouth nightly.      ??? carvediloL (COREG) 6.25 MG tablet Take 1 tablet (6.25 mg total) by mouth Two (2) times a day. 60 tablet 11   ??? darolutamide (NUBEQA) 300 mg tablet Take 2 tablets (600 mg total) by mouth 2 (two) times a day with meals. Take with food. Swallow tablets whole. 120 tablet 11   ??? ferrous sulfate 325 (65 FE) MG tablet Take 325 mg by mouth daily.     ??? oxyCODONE (ROXICODONE) 5 MG immediate release tablet Take 1 tablet (5 mg total) by mouth every eight (8) hours as needed for pain. 90 tablet 0   ??? polyethylene glycol (MIRALAX) 17 gram packet Take 17 g by mouth two (2) times a day as needed.       No current facility-administered medications for this visit.     Facility-Administered Medications Ordered in Other Visits   Medication Dose Route Frequency Provider Last Rate Last Admin   ??? leuprolide (6 month) (ELIGARD) 45 mg injection                 Changes to medications: Stephen Bartlett reports no changes at this time.    No Known Allergies    Changes to allergies: No    SPECIALTY MEDICATION ADHERENCE     Nubeqa 300 mg: Unsure how many days of medicine on hand - he was not at home but denied missed doses ([facility] last filled 10/30/20)    Medication Adherence    Patient reported X missed doses in the last month: 0  Specialty Medication: Nubeqa 300mg  - 2 tabs bid  Patient is on additional specialty medications: No  Informant: patient  Confirmed plan for next specialty medication refill: delivery by pharmacy          Specialty medication(s) dose(s) confirmed: Regimen is correct and unchanged.     Are there any concerns with adherence? Not at this time - will continue to monitor adherence    Adherence counseling provided? Not needed    CLINICAL MANAGEMENT AND INTERVENTION      Clinical Benefit Assessment:    Do you feel the medicine is effective or helping your condition? Yes    Clinical Benefit counseling provided? Not needed    Adverse Effects Assessment:    Are you experiencing any side effects? No    Are you experiencing difficulty administering your medicine? No    Quality of Life Assessment:    How many days over the past month did your malignant neoplasm of prostate cancer  keep you from your normal activities? For example, brushing your teeth or getting up in the morning. 0    Have you discussed this with your provider? Not needed    Acute Infection Status:    Acute infections noted within Epic:  No active infections  Patient  reported infection: None    Therapy Appropriateness:    Is therapy appropriate? Yes, therapy is appropriate and should be continued    DISEASE/MEDICATION-SPECIFIC INFORMATION      N/A    PATIENT SPECIFIC NEEDS     - Does the patient have any physical, cognitive, or cultural barriers? No    - Is the patient high risk? Yes, patient is taking oral chemotherapy. Appropriateness of therapy as been assessed    - Does the patient require a Care Management Plan? No     - Does the patient require physician intervention or other additional services (i.e. nutrition, smoking cessation, social work)? No      SHIPPING     Specialty Medication(s) to be Shipped:   Hematology/Oncology: Nubeqa    Other medication(s) to be shipped: No additional medications requested for fill at this time     Changes to insurance: No    Delivery Scheduled: Yes, Expected medication delivery date: 12/03/20.     Medication will be delivered via Next Day Courier to the confirmed prescription address in [facility].    The patient will receive a drug information handout for each medication shipped and additional FDA Medication Guides as required.  Verified that patient has previously received a Conservation officer, historic buildings and a Surveyor, mining.    All of the patient's questions and concerns have been addressed.    Stephen Bartlett [facility] Specialty Pharmacist

## 2020-12-02 ENCOUNTER — Telehealth: Payer: Self-pay | Admitting: Nurse Practitioner

## 2020-12-02 MED FILL — NUBEQA 300 MG TABLET: ORAL | 30 days supply | Qty: 120 | Fill #4

## 2020-12-02 NOTE — Telephone Encounter (Signed)
Spoke with patient to see if we could change the time of the 12/23/20 Palliative f/u visit from 9:30 AM to 11 AM and he was in agreement with this.

## 2020-12-22 NOTE — Unmapped (Signed)
[facility] Refill Coordination Note    Specialty Medication(s) to be Shipped:   Hematology/Oncology: Nubeqa    Other medication(s) to be shipped: No additional medications requested for fill at this time     [name], DOB: [birth year]  Phone: [phone] (home)       All above HIPAA information was verified with patient.     Was a Nurse, learning disability used for this call? No    Completed refill call assessment today to schedule patient's medication shipment from the [facility] [phone]).  All relevant notes have been reviewed.     Specialty medication(s) and dose(s) confirmed: Regimen is correct and unchanged.   Changes to medications: [name] reports no changes at this time.  Changes to insurance: No  New side effects reported not previously addressed with a pharmacist or physician: None reported  Questions for the pharmacist: No    Confirmed patient received a Conservation officer, historic buildings and a Surveyor, mining with first shipment. The patient will receive a drug information handout for each medication shipped and additional FDA Medication Guides as required.       DISEASE/MEDICATION-SPECIFIC INFORMATION        N/A    SPECIALTY MEDICATION ADHERENCE     Medication Adherence    Patient reported X missed doses in the last month: 0  Specialty Medication: Nubeqa 300mg    Patient is on additional specialty medications: No  Informant: patient              Were doses missed due to medication being on hold? No    Nubeqa 300 mg: 12 days of medicine on hand       REFERRAL TO PHARMACIST     Referral to the pharmacist: Not needed      [facility]     Shipping address confirmed in Epic.     Delivery Scheduled: Yes, Expected medication delivery date: 12/31/20.     Medication will be delivered via Next Day Courier to the prescription address in Epic [place].    [name]   [facility] Specialty Technician

## 2020-12-23 ENCOUNTER — Other Ambulatory Visit: Payer: Self-pay

## 2020-12-23 ENCOUNTER — Encounter: Payer: Self-pay | Admitting: Nurse Practitioner

## 2020-12-23 ENCOUNTER — Other Ambulatory Visit: Payer: Medicare HMO | Admitting: Nurse Practitioner

## 2020-12-23 DIAGNOSIS — R5381 Other malaise: Secondary | ICD-10-CM

## 2020-12-23 DIAGNOSIS — Z515 Encounter for palliative care: Secondary | ICD-10-CM

## 2020-12-23 NOTE — Progress Notes (Signed)
[facility] Palliative Care Consult Note Telephone: [phone]  Fax: [phone]    Date of encounter: 12/23/20 PATIENT NAME: [name] [name] [address] [place] [address]   [phone] (home)  DOB: 21-Jul-1941 MRN: [id number] Oncology provider: Dr Arlyss [name]: [name] [name]    RESPONSIBLE PARTY:    Contact Information    Name Relation Home Work Mobile   [name] [name]  [phone]    [place], [place]   [phone]     I met face to face with patient and wife in home. Palliative Care was asked to follow this patient by consultation request of  [name] [name] [name] Oncology;  to address advance care planning and complex medical decision making. This is a follow up visit  ASSESSMENT AND PLAN / RECOMMENDATIONS:   Advance Care Planning/Goals of Care: Goals include to maximize quality of life and symptom management. Our advance care planning conversation included a discussion about:     The value and importance of advance care planning   Experiences with loved ones who have been seriously ill or have died   Exploration of personal, cultural or spiritual beliefs that might influence medical decisions   Exploration of goals of care in the event of a sudden injury or illness   Identification and preparation of a healthcare agent   Review and updating or creation of an  advance directive document .  Decision not to resuscitate or to de-escalate disease focused treatments due to poor prognosis.  CODE STATUS: Full code  Symptom Management/Plan: 1. ACP: Full code, ongoing discussions with [name]/[name] [name] with treatment options for further planning as cancer continues to progress. Pending HCPOA documents  2. Debility secondary to upper back pain due to bone mets from prostate cancer encourage mobility as able. Continue on current pain regimen with oxycodone 5 mg every 8 hours as needed pain-averaging 2-3 tabs/day through [place] clinic. Decadron  was d/c refer to [name] Palliative note on 10/30/2020. Currently [name]. [name] endorses he is comfortable.   3. Palliative care encounter; Palliative care encounter; Palliative medicine team will continue to support patient, patient's family, and medical team. Visit consisted of counseling and education dealing with the complex and emotionally intense issues of symptom management and palliative care in the setting of serious and potentially life-threatening illness  Follow up Palliative Care Visit: Palliative care will continue to follow for complex medical decision making, advance care planning, and clarification of goals. Return 8 weeks or prn.  I spent 35 minutes providing this consultation. More than 50% of the time in this consultation was spent in counseling and care coordination.  PPS: 50%  HOSPICE ELIGIBILITY/DIAGNOSIS: TBD  Chief Complaint: Follow-up in-home Palliative consult for complex medical decision making  HISTORY OF PRESENT ILLNESS:  [name] [name] is a 80 y.o. 80 y.o. 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 Neuroendocrine tumor grade 1 pancreatic mass.Restaging scans 06/2019 and3/2020 have been stablepost-Radium.Stable scans, PSA has been rising, from 31 in 08/2018 to 60 in 05/2019,Prostatitis, hypertension, hyperlipidemia, history of back surgery. I called [name] [name] to confirm in person follow up pallative care visit. [name] [name] in agreement in [place] screening negative. I visited [name] [name] in his home. [name] [name] answered the door, [name] [name] was sitting on the couch. [name] [name] invited me into his home. We talked about purpose of palliative care visit. We talked about how he has been feeling today. [name] [name] endorses  that he's doing good. We talked about symptoms of pain. [name] [name] endorses that he does take oxycodone for pain with relief period [name] [name] endorses typically he takes one to  two tablets a day in addition to a stool softener. [name] [name] endorses the oxycodones do make him constipated but his bowel regimen improves that. We talked about his appetite. [name] [name] endorses his appetite is good. He likes to eat. [name] [name] endorses no noted weight loss or change in clothes size. We talked about onkologie treatment. [name] [name] and I talked about sleep, sleep pattern and hygiene for which he sleeps well. [name] [name] endorses that he still drives and does go visit his family and friends often. [name] [name] talked about his work that he retired from in [employer]. [name] [name] talked about his son who is an Forensic psychologist. Discussed will contact [name] [name] son 79 after visit for update on palliative care visit. We talked about medications, medications reviewed no noted side effects. [name] [name] had his medications in a bag. [name]. [name] and i talked about organizing his medications for safety. [name] [name] and I talked about possibly using a weekly medication scheduling box but [name] [name] endorses he prefers the method that he uses. [name] [name] endorses he has been taking some of the medications for many years and he knows how to take his medications. We talked about role of palliative care and plan of care period we talked about follow up palliative care visit in eight weeks if needed or sooner should he decline. [name] [name] in agreement, appointment scheduled. Therapeutic listening and emotional support provided. Questions answered the satisfaction. [name] [name] endorses no new needs or concerns.  Will contact son for further discussion of PC visit with [name]. [name].  History obtained from review of EMR, discussion with primary team, and interview with family, facility staff/caregiver and/or [name]. [name].  I reviewed available labs, medications, imaging, studies and related documents from the EMR.  Records reviewed and summarized above.   ROS Full 14 system review of systems performed and negative with exception of: as per  HPI.  Physical Exam: Constitutional: NAD General: frail appearing, thin, pleasant male EYES: lids intact ENMT: oral mucous membranes moist CV: S1S2, RRR, no LE edema Pulmonary: LCTA, no increased work of breathing, no cough, room air Abdomen: normo-active BS + 4 quadrants, soft and non tender, no ascites GU: deferred MSK:  moves all extremities, ambulatory Skin: warm and dry, no rashes or wounds on visible skin Neuro:  no generalized weakness,  no cognitive impairment Psych: non-anxious affect, A and O x 3  Questions and concerns were addressed. The patient/family was encouraged to call with questions and/or concerns. My business card was provided. Provided general support and encouragement, no other unmet needs identified  Thank you for the opportunity to participate in the care of [name]. Condie.  The palliative care team will continue to follow. Please call our office at [phone] if we can be of additional assistance.   This chart was dictated using voice recognition software. Despite best efforts to proofread, errors can occur which can change the documentation meaning.  Prospero Mahnke Z Helaina Stefano, NP , MSN, ACHPN  COVID-19 PATIENT SCREENING TOOL Asked and negative response unless otherwise noted:   Have you had symptoms of covid, tested positive or been in contact with someone with symptoms/positive test in the past 5-10 days? NO

## 2020-12-30 MED FILL — NUBEQA 300 MG TABLET: ORAL | 30 days supply | Qty: 120 | Fill #5

## 2021-01-22 NOTE — Unmapped (Signed)
[facility] Refill Coordination Note    Specialty Medication(s) to be Shipped:   Hematology/Oncology: Nubeqa    Other medication(s) to be shipped: No additional medications requested for fill at this time     [name], DOB: 1940/11/09  Phone: [phone] (home)       All above HIPAA information was verified with patient.     Was a Nurse, learning disability used for this call? No    Completed refill call assessment today to schedule patient's medication shipment from the [facility] [phone]).  All relevant notes have been reviewed.     Specialty medication(s) and dose(s) confirmed: Regimen is correct and unchanged.   Changes to medications: [name] reports no changes at this time.  Changes to insurance: No  New side effects reported not previously addressed with a pharmacist or physician: None reported  Questions for the pharmacist: No    Confirmed patient received a Conservation officer, historic buildings and a Surveyor, mining with first shipment. The patient will receive a drug information handout for each medication shipped and additional FDA Medication Guides as required.       DISEASE/MEDICATION-SPECIFIC INFORMATION        N/A    SPECIALTY MEDICATION ADHERENCE     Medication Adherence    Patient reported X missed doses in the last month: 0  Specialty Medication: Nubeqa 300 mg  Patient is on additional specialty medications: No              Were doses missed due to medication being on hold? No    Nubeqa 300 mg: 7 days of medicine on hand        REFERRAL TO PHARMACIST     Referral to the pharmacist: Not needed      [facility]     Shipping address confirmed in Epic.     Delivery Scheduled: Yes, Expected medication delivery date: 01/27/21.     Medication will be delivered via Next Day Courier to the prescription address in Epic [place].    Unk Lightning   [facility] Specialty Technician

## 2021-01-26 MED FILL — NUBEQA 300 MG TABLET: ORAL | 30 days supply | Qty: 120 | Fill #6

## 2021-01-29 ENCOUNTER — Ambulatory Visit
Admit: 2021-01-29 | Discharge: 2021-01-29 | Payer: MEDICARE | Attending: Student in an Organized Health Care Education/Training Program | Primary: Student in an Organized Health Care Education/Training Program

## 2021-01-29 ENCOUNTER — Other Ambulatory Visit: Admit: 2021-01-29 | Discharge: 2021-01-29 | Payer: MEDICARE

## 2021-01-29 ENCOUNTER — Ambulatory Visit: Admit: 2021-01-29 | Discharge: 2021-01-29 | Payer: MEDICARE | Attending: Medical Oncology | Primary: Medical Oncology

## 2021-01-29 ENCOUNTER — Institutional Professional Consult (permissible substitution): Admit: 2021-01-29 | Discharge: 2021-01-29 | Payer: MEDICARE

## 2021-01-29 DIAGNOSIS — Z515 Encounter for palliative care: Principal | ICD-10-CM

## 2021-01-29 DIAGNOSIS — C61 Malignant neoplasm of prostate: Principal | ICD-10-CM

## 2021-01-29 DIAGNOSIS — G893 Neoplasm related pain (acute) (chronic): Principal | ICD-10-CM

## 2021-01-29 DIAGNOSIS — C7951 Secondary malignant neoplasm of bone: Principal | ICD-10-CM

## 2021-01-29 LAB — COMPREHENSIVE METABOLIC PANEL
ALBUMIN: 4.2 g/dL (ref 3.4–5.0)
ALKALINE PHOSPHATASE: 128 U/L — ABNORMAL HIGH (ref 46–116)
ALT (SGPT): 12 U/L (ref 10–49)
ANION GAP: 5 mmol/L (ref 5–14)
AST (SGOT): 23 U/L (ref <=34)
BILIRUBIN TOTAL: 0.6 mg/dL (ref 0.3–1.2)
BLOOD UREA NITROGEN: 19 mg/dL (ref 9–23)
BUN / CREAT RATIO: 15
CALCIUM: 9.2 mg/dL (ref 8.7–10.4)
CHLORIDE: 106 mmol/L (ref 98–107)
CO2: 26 mmol/L (ref 20.0–31.0)
CREATININE: 1.25 mg/dL — ABNORMAL HIGH
EGFR CKD-EPI (2021) MALE: 59 mL/min/{1.73_m2} — ABNORMAL LOW (ref >=60)
GLUCOSE RANDOM: 88 mg/dL (ref 70–179)
POTASSIUM: 3.7 mmol/L (ref 3.5–5.1)
PROTEIN TOTAL: 7.4 g/dL (ref 5.7–8.2)
SODIUM: 137 mmol/L (ref 135–145)

## 2021-01-29 LAB — CBC W/ AUTO DIFF
BASOPHILS ABSOLUTE COUNT: 0 10*9/L (ref 0.0–0.1)
BASOPHILS RELATIVE PERCENT: 0.7 %
EOSINOPHILS ABSOLUTE COUNT: 0.3 10*9/L (ref 0.0–0.5)
EOSINOPHILS RELATIVE PERCENT: 6.2 %
HEMATOCRIT: 34.4 % — ABNORMAL LOW (ref 39.0–48.0)
HEMOGLOBIN: 11.5 g/dL — ABNORMAL LOW (ref 12.9–16.5)
LYMPHOCYTES ABSOLUTE COUNT: 1.8 10*9/L (ref 1.1–3.6)
LYMPHOCYTES RELATIVE PERCENT: 37.2 %
MEAN CORPUSCULAR HEMOGLOBIN CONC: 33.5 g/dL (ref 32.0–36.0)
MEAN CORPUSCULAR HEMOGLOBIN: 31.2 pg (ref 25.9–32.4)
MEAN CORPUSCULAR VOLUME: 93.2 fL (ref 77.6–95.7)
MEAN PLATELET VOLUME: 7.8 fL (ref 6.8–10.7)
MONOCYTES ABSOLUTE COUNT: 0.5 10*9/L (ref 0.3–0.8)
MONOCYTES RELATIVE PERCENT: 10.9 %
NEUTROPHILS ABSOLUTE COUNT: 2.2 10*9/L (ref 1.8–7.8)
NEUTROPHILS RELATIVE PERCENT: 45 %
PLATELET COUNT: 208 10*9/L (ref 150–450)
RED BLOOD CELL COUNT: 3.69 10*12/L — ABNORMAL LOW (ref 4.26–5.60)
RED CELL DISTRIBUTION WIDTH: 15.2 % (ref 12.2–15.2)
WBC ADJUSTED: 4.9 10*9/L (ref 3.6–11.2)

## 2021-01-29 LAB — PSA: PROSTATE SPECIFIC ANTIGEN: 43.03 ng/mL — ABNORMAL HIGH (ref 0.00–4.00)

## 2021-01-29 LAB — TESTOSTERONE: TESTOSTERONE TOTAL: <7 ng/dL — ABNORMAL LOW

## 2021-01-29 MED ORDER — OXYCODONE 5 MG TABLET
ORAL_TABLET | Freq: Three times a day (TID) | ORAL | 0 refills | 30 days | Status: CP | PRN
Start: 2021-01-29 — End: ?

## 2021-01-29 MED ADMIN — leuprolide acetate (6 month) (ELIGARD) injection 45 mg: 45 mg | SUBCUTANEOUS | @ 14:00:00 | Stop: 2021-01-29

## 2021-01-29 NOTE — Unmapped (Signed)
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  Also has neuroendocrine tumor grade 1 pancreatic mass.  Restaging scans 06/2019 and 10/2018 were stable post-Radium.  He started darolutamide (held briefly during a hospitalization for COVID-19 in late 2020).  PSA was 31 in 08/2018 to 60 in 05/2019, then 51.5 on 06/28/2019 and 45.9 on 09/13/19 and 34.5 on 11/22/19 and down to 17.76 on 04/03/20 with castrate testosterone levels.  Today continues to rise.  He has limited systemic options available, as he likely would not tolerate chemotherapy.    Lab Results   Component Value Date    PSA 27.63 (H) 10/30/2020    PSA 20.65 (H) 07/31/2020    PSA 17.76 (H) 04/03/2020    PSA 34.50 (H) 11/22/2019    PSA 45.90 (H) 09/13/2019    PSA 51.50 (H) 06/28/2019       He has had challenges with organizing medications at home.  He lives with his wife who works and is not able to help with this, and his son lives in [place] but is becoming more involved.  A referral was made to Home Health to assist.  There has been some question about his compliance with treatment, as he has had excess drug remaining at times of refills.  He has been counseled multiple times by Pharmacy, and Pharmacy and I will again counsel him today and review meds.    He is followed by Palliative Care for back pain in his back at the area of prior surgery where he has a rod.  He is on Oxycodone.  He has few treatment options, and he understands the goals of care are supportive and palliative.  He has done advanced care planning with Palliative Care.    Plan:  - Continue ADT; Eligard every 22-months (01/29/2021).  - Continue darolutamide.  - Follow PSA.  - Blood pressure control with Katina Degree as needed.  - Followed by Palliative Care for pain control, thank you.  - Prior increased frequency of urination:  Better since stopping tamsulsoin. PVR has been low.  - LEE, improved with diuretic with his PCP -- he will follow up with PCP about this.  - Follow PSA.  - LEE: Diuretic as needed with his PCP and Palliative Care.  - Code status: He has been preparing a living will with his son and has discussed with with Palliative Care.  He still wishes to be full code at this time.    Follow up with me in 3 months for labs and follow up, and in 6 months Eligard.    ---------------------------------------------------------    HPI:  - Prior patient of Dr. Verl Bangs  - 2013: Presented with metastatic prostate cancer to bone, PSA ~900, started Lupron, then Avodart/Casodex, then abiraterone/Lupron  - 1/19: Experienced cord compression while on Lupron/abiraterone --> RT  - Neuroendocrine tumor grade 1 pancreatic mass will be observed.  - 08/11/17: Bone scan: c/w 5/18, stable metastatic disease in the thoracic spine and right hemipelvis.  Redemonstration of radiotracer uptake in the upper thoracic vertebral body. Slightly decreased uptake within the right hemipelvis, consistent with previously seen mixed sclerotic/lytic lesions. No new areas of radiotracer uptake. Physiologic uptake is seen in the kidneys and bladder.  - 09/08/17: CT A/P: 1. Interval improvement in previously visualized pancreatic tail stranding and free fluid with lobulated intermediate density peripancreatic  collection along the pancreatic tail, closely abutting the anterior aspect of the spleen. Findings may represent complex pseudocyst in the setting of prior pancreatitis. MRI MRCP is recommended for further evaluation as known neuroendocrine pancreatic tumor is not well delineated on this portal venous phase CT and to exclude postcontrast enhancement of the suspected complex pancreatic pseudocyst along the tail.  2. Right lower lobe bronchiectasis with mucous plugging.  3. Hepatic hypodensities, unchanged and favored to represent hepatic cysts. This may be also be confirmed by MRI.  2/19: Discontinued abiraterone  6-11/19: Radium-223  11/02/18: CT A/P: No new lytic or blastic osseous lesions. Similar appearance of mixed sclerotic and lytic lesion in the right hemipelvis.  11/02/18: Bone Scan: Slightly decrease in radiotracer uptake involving the thoracic spine and right hemipelvis when compared to prior imaging.  05/31/19: PSA 60.4  06/25/19: CT A/P: Similar appearance of mixed sclerotic and lytic lesions in the right hemipelvis. No new lytic or blastic lesions.  Similar appearance of hyperenhancing pancreatic body mass, compatible with pancreatic neuroendocrine tumor.  Decreased size of fluid collection adjacent to the pancreatic tail/inferior margin of the spleen.  Bronchiectasis with mucous plugging in the right lower lobe, similar to prior.  06/25/19: Similar focal increase in radiotracer uptake within the right hemipelvis when compared to prior bone scan from 11/02/2018. Stable focal uptake in the upper thoracic vertebral bodies.    ROS: Feeling well today. Pain is stable, no new pain. Urinary frequency better since stopping tamsulosin.    Current Outpatient Medications on File Prior to Visit   Medication Sig Dispense Refill   ??? allopurinoL (ZYLOPRIM) 100 MG tablet Take 1 tablet (100 mg total) by mouth daily. 90 tablet 3   ??? amLODIPine (NORVASC) 5 MG tablet Take 1 tablet by mouth daily.     ??? atorvastatin (LIPITOR) 10 MG tablet Take 10 mg by mouth nightly.      ??? darolutamide (NUBEQA) 300 mg tablet Take 2 tablets (600 mg total) by mouth 2 (two) times a day with meals. Take with food. Swallow tablets whole. 120 tablet 11   ??? ferrous sulfate 325 (65 FE) MG tablet Take 325 mg by mouth daily.     ??? oxyCODONE (ROXICODONE) 5 MG immediate release tablet Take 1 tablet (5 mg total) by mouth every eight (8) hours as needed for pain. 90 tablet 0   ??? polyethylene glycol (MIRALAX) 17 gram packet Take 17 g by mouth two (2) times a day as needed. ??? carvediloL (COREG) 6.25 MG tablet Take 1 tablet (6.25 mg total) by mouth Two (2) times a day. 60 tablet 11     Current Facility-Administered Medications on File Prior to Visit   Medication Dose Route Frequency Provider Last Rate Last Admin   ??? leuprolide (6 month) (ELIGARD) 45 mg injection                PE:  BP 138/70  - Pulse 66  - Temp 36.7 ??C (98 ??F) (Tympanic)  - Resp 18  - Ht 167.6 cm (5' 6)  - Wt 62.1 kg (137 lb)  - SpO2 (!) 66%  - BMI 22.11 kg/m??   NAD  A+Ox3  No rash  Abd ND    Data:    PSA   Date Value Ref Range Status   10/30/2020 27.63 (H) 0.00 - 4.00 ng/mL Final   07/31/2020 20.65 (H) 0.00 - 4.00 ng/mL Final   04/03/2020 17.76 (H) 0.00 - 4.00 ng/mL Final   11/22/2019 34.50 (H) 0.00 -  4.00 ng/mL Final   09/13/2019 45.90 (H) 0.00 - 4.00 ng/mL Final   06/28/2019 51.50 (H) 0.00 - 4.00 ng/mL Final   05/31/2019 60.40 (H) 0.00 - 4.00 ng/mL Final   08/31/2018 31.30 (H) 0.00 - 4.00 ng/mL Final   07/06/2018 35.00 (H) 0.00 - 4.00 ng/mL Final   06/01/2018 33.10 (H) 0.00 - 4.00 ng/mL Final   05/04/2018 42.00 (H) 0.00 - 4.00 ng/mL Final   03/30/2018 42.70 (H) 0.00 - 4.00 ng/mL Final   01/26/2018 46.20 (H) 0.00 - 4.00 ng/mL Final   08/25/2017 61.50 (H) 0.00 - 4.00 ng/mL Final   08/08/2017 80.90 (H) 0.00 - 4.00 ng/mL Final   05/19/2017 34.70 (H) 0.00 - 4.00 ng/mL Final   02/10/2017 30.50 (H) 0.00 - 4.00 ng/mL Final   01/20/2017 19.60 (H) 0.00 - 4.00 ng/mL Final   01/07/2017 18.70 (H) 0.00 - 4.00 ng/mL Final   12/22/2016 19.40 (H) 0.00 - 4.00 ng/mL Final   12/03/2016 31.70 (H) 0.00 - 4.00 ng/mL Final   11/04/2016 30.30 (H) 0.00 - 4.00 ng/mL Final   08/06/2016 18.80 (H) 0.00 - 4.00 ng/mL Final   05/06/2016 24.10 (H) 0.00 - 4.00 ng/mL Final   01/29/2016 12.50 (H) 0.00 - 4.00 ng/mL Final   10/30/2015 11.10 (H) 0.00 - 4.00 ng/mL Final   07/24/2015 7.41 (H) 0.00 - 4.00 ng/mL Final   04/03/2015 3.44 0.00 - 4.00 ng/mL Final   12/23/2014 12.50 (H) 0.00 - 4.00 ng/mL Final

## 2021-01-29 NOTE — Unmapped (Unsigned)
0981 -  Blood drawn from Left FA by [name]., RN.  Specimens sent to lab for analysis.

## 2021-01-29 NOTE — Unmapped (Signed)
OUTPATIENT ONCOLOGY PALLIATIVE CARE    Principal Diagnosis: [name] is a 80 y.o. 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     Assessment/Plan:   1.  Upper back pain due to bone mets from prostate cancer-controlled with current regimen and no longer on decadron, as well as chronic pain from previous back surgery 3+ years ago.  Pain has been adequately controlled on oxycodone as needed.  -Continue oxycodone 5 mg every 8 hours as needed pain-averaging 2-3 tabs/day, new prescription to be written today and on or after 02/27/2021.  Patient had run out several days ago.  Educated him on how to contact us.        2. Support- Did have Covid in 2020 and recovered.  Was able to get his flu vaccine for this season and both injections of the Covid vaccine.  He is waiting for the 82-month window to get the Covid booster which he still has not received.  Active with [facility] and this has been beneficial.  Physical therapy has just signed off.  Appears to have better medication compliance.    -Receptive to [facility] home-based palliative care in addition to our team's for support.  Will connect with Stephen Lai RN for order placement.  -Encouraged covid booster. Does have a contact to get this scheduled.       3. Advanced care planning-at initial visit introduced idea of advance care planning today.  Son Stephen Bartlett expressed clear desire to be straight forward into the point.  Currently they do not feel like talking about use and hypothetical situations is particularly helpful.  He states he is a Clinical research associate that frequently does advance directives and he will plan to complete these with his father.  Introduced the idea that if/when we are at a point where certain decisions need to be made we are available to have these conversations.    After our visit with patient and his son, conversation held with Dr. [name] and unfortunately his cancer continues to progress, and further cancer directed treatment unlikely to provide significantly more benefit.  Recommendation for hospice in the near future likely appropriate.  We will remain available to help support these conversations if helpful.    Living will brought in today, and will be scanned into the chart.  Also reviewed CODE STATUS, and Dr. [name] and I made our recommendation to change his CODE STATUS to DNR given his advanced cancer, overall frailty, and desire to avoid suffering.  They stated that he and his son will talk about it and let us know if they want to change his CODE STATUS.    -At prior visits: still with no healthcare power of attorney or living will.   - Son-Stephen is receptive to having these completed.  Stephen works at a  [employer], water [employer].  Shared the prepare for your care form with his son via text.  -HCDM-son Stephen Bartlett.  -NP did discuss that in the state of [place], his wife will be his agent unless he does get this in writing.  -Patient wants his son Stephen Bartlett to be his primary agent, but he does not have advanced care planning documents.  His secondary agent would be his spouse.  -Discussed values and maintaining his independence and his relationship with his son are important to him.  His strengths include his religious faith and is grateful for his family and a primary goal is being able to live independently.      # Controlled  substances risk management.   - Patient does not have a signed pain medication agreement with our team, but this was discussed verbally.   - [place]CCS[place] database was reviewed today and it was appropriate.   - Urine drug screen was not performed at this visit. Findings: not applicable.   - Patient has received information about safe storage and administration of medications.   - Patient has not received a prescription for narcan.      F/u: 6 to 8-week video check-in  ----------------------------------------  Referring Provider: Dr. [name]  Oncology Team: Dr. 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  In January 2019, patient had cord compression and subsequent surgery & XRT with a rod from C7-T8.  His prior tx have been Radium 223, Avodart/Casodex and abiraterone/Lupron. Restaging scans 3/20 (post-Redium) are stable/slightly improved. He is also being followed for a neuroendocrine tumor grade 1 pancreatic mass.     Patient's primary symptom is his upper back pain which is exacerbated by changes in the weather.  He reports that the pain is currently at 7/10, but he has not taken the a.m. ibuprofen 600 mg and the Decadron 2 mg.  He does find some relief with this.  Has some difficulty using the pain scores.  Feels that he still not getting adequate amount of relief.  He describes the pain as a numbing and aching sensation.  He had tried Tylenol in the past with no relief. Does endorse history of marijuana use.  No other illicit drug use and opioid risk assessment tool was done today.    Patient shared that when he had his spine surgery he could not walk and it took much time and energy for him to regain his strength and walk again. He still drives and is able to do some cooking in the home.  He is independent with his personal ADLs.    ??  Current cancer-directed therapy: ADT- Eligard 45 (6 month injection)  and  Darolutamide       Interval hx: Overall he states that he is feeling pretty good.  Feels like his pain is generally well controlled.  Describes it as pain in his back that is from a rod he had placed a while ago.  Typically takes 1 to 2 tablets 2-3 times a day, but sometimes does not need to take any.  He prefers to try and minimize medications.  He did run out of oxycodone several days ago, and notes that his pain has been slightly worse.  Having regular bowel movements.  Typically tries to eat a lot of greens uses MiraLAX 1-2 times a day.  Generally has a bowel movement once a day.    Briefly had joint visit with oncology, discussed advance care planning as above.    Symptom Review:  General: Overall continues doing fairly well.  Pain: See above  Fatigue: Energy good-remains active. Drives, shops and visits sister who lives locally.  Mobility: Able to do the things that he wants to do.  Does have a cane if he leaves the home.  Sleep: No concerns  Appetite: Describes as good.  Bowel function: See above  Dyspnea: None  Mood: Good, no issues with anxiety or depression.    Palliative Performance Scale: 80% - Ambulation: Full / Normal Activity with effort, some evidence of disease / Self-Care:Full / Intake: Normal or reduced / Level of Conscious: Full  Coping/Support Issues: Has the support of his spiritual practice.  He had been attending the [employer] on a regular basis prior to  COVID-19.  His most supportive people are his son, who is a Clinical research associate in [place] and his sister who is a Runner, broadcasting/film/video in [place].  He also has a sister who is a retired Charity fundraiser that lives about 1 hour away.  He does live with his wife since 77.  It is a second marriage for him.    Goals of Care: To feel better    Social History:   Name of primary support: Son, wife and sister  Occupation: Retired.  He had worked in the [employer]  Hobbies: Enjoys visiting family and sitting on the front porch.  Current residence / distance from [facility]: lives in [place].  45 minutes from [facility].  Shares it is easy to get to the [facility] campus    Advance Care Planning:   HCPOA:  Natural surrogate decision maker: His son would be his primary agent and his wife would be the secondary  Living Will: No  ACP note:   CODE STATUS: Full    Objective     Opioid Risk Tool:    Male  Male    Family history of substance abuse      Alcohol  1  3    Illegal drugs  2  3    Rx drugs  4  4    Personal history of substance abuse      Alcohol  3  3    Illegal drugs  4  4    Rx drugs  5  5    Age between 16--45 years  1  1    History of preadolescent sexual abuse  3 0    Psychological disease      ADD, OCD, bipolar, schizophrenia  2  2    Depression  1  1       Total: 0  (<3 low risk, 4-7 moderate risk, >8 high risk)    Oncology History   Malignant neoplasm of prostate (CMS-HCC)   09/30/2011 Initial Diagnosis    Malignant neoplasm of prostate (CMS-HCC)     11/23/2018 Endocrine/Hormone Therapy    OP LEUPROLIDE (ELIGARD) 45 MG EVERY 6 MONTHS  Plan Provider: Chrisandra Netters, MD     Bone metastases (CMS-HCC)   08/24/2017 Initial Diagnosis    Bone metastases (CMS-HCC)     11/23/2018 Endocrine/Hormone Therapy    OP LEUPROLIDE (ELIGARD) 45 MG EVERY 6 MONTHS  Plan Provider: Chrisandra Netters, MD         Patient Active Problem List   Diagnosis   ??? Malignant neoplasm of prostate (CMS-HCC)   ??? 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 Hypertension   ??? Calculus of kidney   ??? Acute renal failure (CMS-HCC)   ??? Foreign body in bladder and urethra   ??? Uric acid nephrolithiasis   ??? Renal cyst, acquired, left   ??? Pancreatic mass   ??? Acute pancreatitis   ??? Bronchiectasis (CMS-HCC)   ??? Acute kidney injury (CMS-HCC)   ??? Lesion of pancreas   ??? Bone metastases (CMS-HCC)       Past Medical History:   Diagnosis Date   ??? Calculus of kidney    ??? Hypertension    ??? Malignant neoplasm of prostate (CMS-HCC)        Past Surgical History:   Procedure Laterality Date   ???  PR ARTHRD PST/PSTLAT TQ 1NTRSPC CRV BELW C2 SEGMENT Midline 08/05/2017    Procedure: ARTHRODESIS, POSTERIOR OR POSTEROLATERAL TECHNIQUE, SINGLE LEVEL; CERVICAL BELOW C2 SEGMENT;  Surgeon: Timothy Lasso, MD;  Location: MAIN OR [facility];  Service: Ortho Spine   ??? PR ARTHRODESIS PST/PSTLAT TQ 1NTRSPC EA ADDL NTRSPC Midline 08/05/2017    Procedure: ARTHRODESIS, POSTERIOR OR POSTEROLATERAL TECHNIQUE, SINGLE LEVEL; EACH ADDITIONAL VERTEBRAL SEGMENT;  Surgeon: Timothy Lasso, MD;  Location: MAIN OR [facility];  Service: Ortho Spine   ??? PR LAM FACETECTOMY&FORAMOT 1 VRT SGM EA ADDL SGM Midline 08/05/2017    Procedure: LAMINECTMY 1 SEGMT-UNI/BIL; EA ADD CERV/THOR/LUM;  Surgeon: Timothy Lasso, MD;  Location: MAIN OR [facility];  Service: Ortho Spine   ??? PR LAMINEC/FACETECT/FORAMIN,CERVICAL 1 SEG Midline 08/05/2017    Procedure: LAMINECTOMY SNGL VERTEBRAL SEGMT-UNI/BIL; CERV;  Surgeon: Timothy Lasso, MD;  Location: MAIN OR [facility];  Service: Ortho Spine   ??? PR POSTERIOR SEGMENTAL INSTRUMENTATION 7-12 VRT SEG Midline 08/05/2017    Procedure: POSTERIOR SEGMENTAL INSTRUMENTATION; (EG, PEDICLE FIXATION, DUAL RODS W/MULT HOOKS/WIRES) 7-12 VERTEB SEGMT;  Surgeon: Timothy Lasso, MD;  Location: MAIN OR [facility];  Service: Ortho Spine   ??? PR UPGI ENDOSCOPY,FN NEEDLE BX,GUIDED N/A 02/11/2017    Procedure: UGI W/TRANSENDOSCOPIC US-GUIDE INTRA/TRANSMURAL NEEDLE ASP/BX (INCL EXAM ESOPHAGUS, STOMACH, DOUDENUM/JEJ);  Surgeon: Vonda Antigua, MD;  Location: [facility];  Service: Gastroenterology   ??? URETEROSCOPY         Current Outpatient Medications   Medication Sig Dispense Refill   ??? allopurinoL (ZYLOPRIM) 100 MG tablet Take 1 tablet (100 mg total) by mouth daily. 90 tablet 3   ??? amLODIPine (NORVASC) 5 MG tablet Take 1 tablet by mouth daily.     ??? atorvastatin (LIPITOR) 10 MG tablet Take 10 mg by mouth nightly.      ??? carvediloL (COREG) 6.25 MG tablet Take 1 tablet (6.25 mg total) by mouth Two (2) times a day. 60 tablet 11   ??? darolutamide (NUBEQA) 300 mg tablet Take 2 tablets (600 mg total) by mouth 2 (two) times a day with meals. Take with food. Swallow tablets whole. 120 tablet 11   ??? ferrous sulfate 325 (65 FE) MG tablet Take 325 mg by mouth daily.     ??? oxyCODONE (ROXICODONE) 5 MG immediate release tablet Take 1 tablet (5 mg total) by mouth every eight (8) hours as needed for pain. 90 tablet 0   ??? polyethylene glycol (MIRALAX) 17 gram packet Take 17 g by mouth two (2) times a day as needed.       No current facility-administered medications for this visit.     Facility-Administered Medications Ordered in Other Visits   Medication Dose Route Frequency Provider Last Rate Last Admin   ??? leuprolide (6 month) (ELIGARD) 45 mg injection            ??? leuprolide acetate (6 month) (ELIGARD) 45 mg injection                Allergies: No Known Allergies    Family History:  Cancer-related family history includes Prostate cancer in an other family member. There is no history of Kidney cancer.  He indicated that the status of his mother is unknown. He indicated that the status of his neg hx is unknown. He indicated that the status of his other is unknown.        Lab Results   Component Value Date    CREATININE 1.17 (H) 10/30/2020  Lab Results   Component Value Date    ALKPHOS 150 (H) 10/30/2020    BILITOT 0.7 10/30/2020    PROT 7.3 10/30/2020    ALBUMIN 3.9 10/30/2020    ALT 10 10/30/2020    AST 19 10/30/2020   Physical exam:  General: Overall well-appearing man, frail, but in no distress  Pulmonary: No increased work of breathing  Skin: No rashes or skin breakdown noted on visible skin  Neurological: Normal gait, no acute deficits  Psychiatric: Mood good, affect congruent and full range    I personally spent 20 minutes face-to-face and non-face-to-face in the care of this patient, which includes all pre, intra, and post visit time on the date of service.     Kathlynn Grate, MD  [facility] Outpatient Oncology Palliative Care

## 2021-01-29 NOTE — Unmapped (Signed)
Pt tolerated Eligard injection without difficulty. PT left [facility] ambulatory,slow, purposeful steady gait, using cane  NAD, no questions, complaints, nor concerns voiced at d/c.

## 2021-01-30 ENCOUNTER — Telehealth: Payer: Self-pay | Admitting: Nurse Practitioner

## 2021-01-30 NOTE — Telephone Encounter (Signed)
Spoke with patient to see if we could change the 03/04/21 Palliative f/u visit and he was in agreement with rescheduling it forl 04/07/21 @ 11 AM.  Instructed patient to call us if anything changed before the appointment and he agreed to do so.

## 2021-02-12 NOTE — Unmapped (Signed)
[facility] Refill Coordination Note    Specialty Medication(s) to be Shipped:   Hematology/Oncology: Nubeqa    Other medication(s) to be shipped: No additional medications requested for fill at this time     Stephen Bartlett, DOB: 1941/05/28  Phone: [phone] (home)       All above HIPAA information was verified with patient.     Was a Nurse, learning disability used for this call? No    Completed refill call assessment today to schedule patient's medication shipment from the [facility] [phone]).  All relevant notes have been reviewed.     Specialty medication(s) and dose(s) confirmed: Regimen is correct and unchanged.   Changes to medications: [name] reports no changes at this time.  Changes to insurance: No  New side effects reported not previously addressed with a pharmacist or physician: None reported  Questions for the pharmacist: No    Confirmed patient received a Conservation officer, historic buildings and a Surveyor, mining with first shipment. The patient will receive a drug information handout for each medication shipped and additional FDA Medication Guides as required.       DISEASE/MEDICATION-SPECIFIC INFORMATION        N/A    SPECIALTY MEDICATION ADHERENCE     Medication Adherence    Patient reported X missed doses in the last month: 0  Specialty Medication: Nubeqa 300 mg  Patient is on additional specialty medications: No  Informant: patient              Were doses missed due to medication being on hold? No    Nubeqa 300 mg: 14 days of medicine on hand       REFERRAL TO PHARMACIST     Referral to the pharmacist: Not needed      [facility]     Shipping address confirmed in Epic.     Delivery Scheduled: Yes, Expected medication delivery date: 02/27/21.     Medication will be delivered via Next Day Courier to the prescription address in Epic [place].    [name]   [facility] Specialty Technician

## 2021-02-26 MED FILL — NUBEQA 300 MG TABLET: ORAL | 30 days supply | Qty: 120 | Fill #7

## 2021-03-12 ENCOUNTER — Telehealth
Admit: 2021-03-12 | Discharge: 2021-03-13 | Payer: MEDICARE | Attending: Student in an Organized Health Care Education/Training Program | Primary: Student in an Organized Health Care Education/Training Program

## 2021-03-12 DIAGNOSIS — G893 Neoplasm related pain (acute) (chronic): Principal | ICD-10-CM

## 2021-03-12 NOTE — Unmapped (Unsigned)
Unable to connect with patients

## 2021-03-23 NOTE — Unmapped (Signed)
[facility] Refill Coordination Note    Specialty Medication(s) to be Shipped:   Hematology/Oncology: Nubeqa    Other medication(s) to be shipped: No additional medications requested for fill at this time     Stephen Bartlett, DOB: 1941/02/04  Phone: [phone] (home)       All above HIPAA information was verified with patient.     Was a Nurse, learning disability used for this call? No    Completed refill call assessment today to schedule patient's medication shipment from the [facility] [phone]).  All relevant notes have been reviewed.     Specialty medication(s) and dose(s) confirmed: Regimen is correct and unchanged.   Changes to medications: [name] reports no changes at this time.  Changes to insurance: No  New side effects reported not previously addressed with a pharmacist or physician: None reported  Questions for the pharmacist: No    Confirmed patient received a Conservation officer, historic buildings and a Surveyor, mining with first shipment. The patient will receive a drug information handout for each medication shipped and additional FDA Medication Guides as required.       DISEASE/MEDICATION-SPECIFIC INFORMATION        N/A    SPECIALTY MEDICATION ADHERENCE     Medication Adherence    Patient reported X missed doses in the last month: 0  Specialty Medication: Nubeqa 300 mg  Patient is on additional specialty medications: No  Informant: patient              Were doses missed due to medication being on hold? No    Nubeqa 300 mg: 10 days of medicine on hand       REFERRAL TO PHARMACIST     Referral to the pharmacist: Not needed      [facility]     Shipping address confirmed in Epic.     Delivery Scheduled: Yes, Expected medication delivery date: 03/31/21.     Medication will be delivered via Next Day Courier to the prescription address in Epic [place].    [name]   [facility] Specialty Technician

## 2021-03-30 MED FILL — NUBEQA 300 MG TABLET: ORAL | 30 days supply | Qty: 120 | Fill #8

## 2021-04-07 ENCOUNTER — Other Ambulatory Visit: Payer: Medicare HMO | Admitting: Nurse Practitioner

## 2021-04-07 ENCOUNTER — Other Ambulatory Visit: Payer: Self-pay

## 2021-04-07 ENCOUNTER — Encounter: Payer: Self-pay | Admitting: Nurse Practitioner

## 2021-04-07 DIAGNOSIS — G4700 Insomnia, unspecified: Secondary | ICD-10-CM

## 2021-04-07 DIAGNOSIS — R5381 Other malaise: Secondary | ICD-10-CM

## 2021-04-07 DIAGNOSIS — Z515 Encounter for palliative care: Secondary | ICD-10-CM

## 2021-04-07 NOTE — Progress Notes (Signed)
[facility] Palliative Care Consult Note Telephone: [phone]  Fax: [phone]    Date of encounter: 04/07/21 PATIENT NAME: [name] [name] [address] [place] [address]   [phone] (home)  DOB: 03-24-1941 MRN: [id number]  PROVIDER:    Ander Slade, MD   [address]   [id number] [facility] [place], [place] [address]   [phone] (Work)   [phone] (Fax)   [facility]   [address]   [place], [place] [address]   [phone]    RESPONSIBLE PARTY:    Contact Information     Name Relation Home Work Mobile   [name] [name] Spouse  [phone]    [name] [name]   [phone]      Due to the COVID-19 crisis, this visit was done via telemedicine from my office and it was initiated and consent by this patient and or family.  I connected with  [name] [name] on 04/07/21 by a video enabled telemedicine application and verified that I am speaking with the correct person.   I discussed the limitations of evaluation and management by telemedicine. The patient expressed understanding and agreed to proceed.This is a follow up visit.                                   ASSESSMENT AND PLAN / RECOMMENDATIONS:  Symptom Management/Plan: 1. ACP: Full code, ongoing discussions with [name]/Dr [name] [name] with treatment options for further planning as cancer continues to progress. Pending HCPOA documents; I discussed at length with [name] [name], [name] [name] medical goals of care, we talked about Hospice benefit through [name] program. We talked about what Hospice services are provided and criteria for eligibility. Discussed aggressive vs comfort care   2. Debility secondary to upper back pain due to bone mets from prostate cancer encourage mobility as able. Continue on current pain regimen with oxycodone 5 mg every 8 hours as needed pain-averaging 2-3 tabs/day through [facility]. [name] [name] endorses he is getting close to needing a refill, [name] [name] will contact [facility] for refill. Currently [name] [name] endorses he is comfortable.    3. Palliative care encounter; Palliative care encounter; Palliative medicine team will continue to support patient, patient's family, and medical team. Visit consisted of counseling and education dealing with the complex and emotionally intense issues of symptom management and palliative care in the setting of serious and potentially life-threatening illness  Follow up Palliative Care Visit: Palliative care will continue to follow for complex medical decision making, advance care planning, and clarification of goals. Return 8 weeks or prn.  I spent 40 minutes providing this consultation. More than 50% of the time in this consultation was spent in counseling and care coordination. PPS: 60%  Chief Complaint: Palliative consult for follow up visit for complex medical decision making  HISTORY OF PRESENT ILLNESS:  [name] [name] is a 80 y.o. 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  Neuroendocrine tumor grade 1 pancreatic mass. Restaging scans 06/2019 and 10/2018 have been stable post-Radium. Stable scans, PSA has been rising, from 31 in 08/2018 to 60 in 05/2019, Prostatitis, hypertension, hyperlipidemia, history of back surgery. I called [name] [name] to confirm in person follow up pallative care visit. [name]. [name] requested to switch from in  person to telemedicine telephonic as video not available. [name]. [name] and I proceeded with visit, talked about purpose of pc visit. We talked about symptoms including pain which has been controlled. [name]. [name] endorses he is getting close to needing a refill of oxycodone but he will contact [name] for refill where he gets it refilled. [name]. [name] endorses no further problems with sob. We talked his  appetite. [name]. [name] endorses he feels like appetite is good though continues to loose weight. We talked about food that he likes, nutrition. We talked about insomnia symptoms. Discussed sleep patterns, sleep hygiene. Medications reviewed. Discussed trying melatonin, [name]. [name] in agreement, rx sent to pharmacy per [name]. [name] request. We talked about medical goals of care. We talked about his understanding of cancer, progression, treatment. [name]. [name] endorses his son, [name] [name] helps him manage decision, understanding his condition, and things that he needs. We talked about [name]. [name] functional ability. [name]. [name] endorses he has not required to walk with cane for last few weeks. No recent falls. [name]. [name] endorses he continues to drive. [name]. [name] endorses he spends a lot of time at home by himself, his wife has a full time job. [name]. 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 We talked about role PC in poc. Discussed f/u pc visit, scheduled per [name]. [name]. Therapeutic listening, emotional support provided. I called [name] [name], [name]. [name] son. [name] [name] returned my call. Updated on PC visit, updated on insomnia, melatonin. We talked about [name]. [name] endorses he was getting close to needing a refill of oxycodone. [name] [name] endorses he will contact [employer]. Medical goals discussed, Oncology visit. We talked about about Hospice benefit through [name] program. We talked about what Hospice services are provided and criteria for eligibility. Discussed aggressive vs comfort care. Discussed will continue to follow at home in collaboration with [facility].  Rx: Melatonin '3mg'$  capsules; 1 po qhs; #30; RF 1 escribed to [employer].  Saw Dr [name] [name] at [facility] history Castration resistant metastatic prostate cancer to bone, s/p treatment with abiraterone/Lupron (experienced cord compression and discontinued 1/19, and underwent neurosurgery), and radium-223  (6-11/19). Also has neuroendocrine tumor grade 1 pancreatic mass. Restaging scans 06/2019 and 10/2018 were stable post-Radium. He started darolutamide (held briefly during a hospitalization for COVID-19 in late 2020). PSA was 31 in 08/2018 to 60 in 05/2019, then 51.5 on 06/28/2019 and 45.9 on 09/13/19 and 34.5 on 11/22/19 and down to 17.76 on 04/03/20 with castrate testosterone levels continues to rise. Limited systemic options available, as he likely would not tolerate chemotherapy.  History obtained from review of EMR, discussion with son, [name]. Mcewan and [name]. Hoese.  I reviewed available labs, medications, imaging, studies and related documents from the EMR.  Records reviewed and summarized above.   ROS Full 14 system review of systems performed and negative with exception of: as per HPI.   Physical Exam: deferred Questions and concerns were addressed. The patient/family was encouraged to call with questions and/or concerns. My contact information was provided. Provided general support and encouragement, no other unmet needs identified   Thank you for the opportunity to participate in the care of [name]. Hopman.  The palliative care team will continue to follow. Please call our office at [phone] if we can be of additional assistance.   This chart was dictated using voice recognition software.  Despite best efforts to proofread,  errors can occur which can change  the documentation meaning.   Tian Mcmurtrey Ihor Gully, NP

## 2021-04-20 ENCOUNTER — Telehealth: Payer: Self-pay

## 2021-04-20 NOTE — Telephone Encounter (Signed)
12 pm.  Follow up phone call made at the request of Christin Gustler, NP to assess effectiveness of melatonin.  Patient states he has not taken any melatonin and has not heard from [facility] regarding a pick up for this medication.  Patient states he will need a refill for his oxycodone 5 mg.  Advised [facility] is providing refills for this medication.  Advsied that I would follow up with [facility] to ensure the Melatonin script was received.    Phone call made to [facility] in [place].  Prescription was received and filled.  Medication is ready for pick up.   Notified patient that medication was ready for pick up.  Patient does not have the number for [facility] to notify them on need for refill.   Phone call made to son [name] who recently received a call from [facility] regarding an appointment.  He will notify team of patient's need for refill.  Son will contact Palliative Care if he is unable to reach [facility] team.

## 2021-04-21 DIAGNOSIS — G893 Neoplasm related pain (acute) (chronic): Principal | ICD-10-CM

## 2021-04-21 DIAGNOSIS — C7951 Secondary malignant neoplasm of bone: Principal | ICD-10-CM

## 2021-04-21 DIAGNOSIS — C61 Malignant neoplasm of prostate: Principal | ICD-10-CM

## 2021-04-21 MED ORDER — OXYCODONE 5 MG TABLET
ORAL_TABLET | Freq: Three times a day (TID) | ORAL | 0 refills | 30 days | Status: CP | PRN
Start: 2021-04-21 — End: ?

## 2021-04-21 NOTE — Unmapped (Signed)
Hi,    Patient Stephen Bartlett called requesting a medication refill for the following:    ??? Medication: Oxycodone  ??? Dosage: 5mg    ??? Days left of medication: 0  ??? [facility]         Check Indicates criteria has been reviewed and confirmed with the patient:    [x]  Preferred Name   [x]  DOB and/or MR#  [x]  Preferred Contact Method  [x]  Phone Number(s)   [x]  Preferred Pharmacy   []  MyChart     Thank you,  Keturah Shavers  [facility]  [phone]

## 2021-04-28 NOTE — Unmapped (Signed)
[facility] Clinical Assessment & Refill Coordination Note    Stephen Bartlett, DOB: 1940/10/23  Phone: [phone] (home)     All above HIPAA information was verified with patient.     Was a Nurse, learning disability used for this call? No    Specialty Medication(s):   Hematology/Oncology: Nubeqa     Current Outpatient Medications   Medication Sig Dispense Refill   ??? allopurinoL (ZYLOPRIM) 100 MG tablet Take 1 tablet (100 mg total) by mouth daily. 90 tablet 3   ??? amLODIPine (NORVASC) 5 MG tablet Take 1 tablet by mouth daily.     ??? atorvastatin (LIPITOR) 10 MG tablet Take 10 mg by mouth nightly.      ??? carvediloL (COREG) 6.25 MG tablet Take 1 tablet (6.25 mg total) by mouth Two (2) times a day. 60 tablet 11   ??? darolutamide (NUBEQA) 300 mg tablet Take 2 tablets (600 mg total) by mouth 2 (two) times a day with meals. Take with food. Swallow tablets whole. 120 tablet 11   ??? ferrous sulfate 325 (65 FE) MG tablet Take 325 mg by mouth daily.     ??? oxyCODONE (ROXICODONE) 5 MG immediate release tablet Take 1 tablet (5 mg total) by mouth every eight (8) hours as needed for pain. 90 tablet 0   ??? oxyCODONE (ROXICODONE) 5 MG immediate release tablet Take 1 tablet (5 mg total) by mouth every eight (8) hours as needed for pain. 90 tablet 0   ??? polyethylene glycol (MIRALAX) 17 gram packet Take 17 g by mouth two (2) times a day as needed.       No current facility-administered medications for this visit.     Facility-Administered Medications Ordered in Other Visits   Medication Dose Route Frequency Provider Last Rate Last Admin   ??? leuprolide (6 month) (ELIGARD) 45 mg injection                 Changes to medications: Kayson reports no changes at this time.    No Known Allergies    Changes to allergies: No    SPECIALTY MEDICATION ADHERENCE     Nubeqa (darolutamide) 300 mg: 7 days of medicine on hand   n    Medication Adherence    Patient reported X missed doses in the last month: 0  Specialty Medication: Nubeqa 300 mg  Patient is on additional specialty medications: No  Informant: patient  Confirmed plan for next specialty medication refill: delivery by pharmacy  Refills needed for supportive medications: not needed          Specialty medication(s) dose(s) confirmed: Regimen is correct and unchanged.     Are there any concerns with adherence? No    Adherence counseling provided? Not needed    CLINICAL MANAGEMENT AND INTERVENTION      Clinical Benefit Assessment:    Do you feel the medicine is effective or helping your condition? Yes    Clinical Benefit counseling provided? Labs from 01/29/21 show evidence of clinical benefit    Adverse Effects Assessment:    Are you experiencing any side effects? No    Are you experiencing difficulty administering your medicine? No    Quality of Life Assessment:    Quality of Life    Rheumatology  Oncology  1. What impact has your specialty medication had on the reduction of your daily pain or discomfort level?: Some  2. On a scale of 1-10, how would you rate your ability to manage side effects  associated with your specialty medication? (1=no issues, 10 = unable to take medication due to side effects): 1  Dermatology  Cystic Fibrosis          How many days over the past month did your prostate cancer  keep you from your normal activities? For example, brushing your teeth or getting up in the morning. 0    Have you discussed this with your provider? Not needed    Acute Infection Status:    Acute infections noted within Epic:  No active infections  Patient reported infection: None    Therapy Appropriateness:    Is therapy appropriate? Yes, therapy is appropriate and should be continued    DISEASE/MEDICATION-SPECIFIC INFORMATION      N/A    PATIENT SPECIFIC NEEDS     - Does the patient have any physical, cognitive, or cultural barriers? No    - Is the patient high risk? No and Yes, patient is taking oral chemotherapy. Appropriateness of therapy as been assessed    - Does the patient require a Care Management Plan? No     - Does the patient require physician intervention or other additional services (i.e. nutrition, smoking cessation, social work)? No      SHIPPING     Specialty Medication(s) to be Shipped:   Hematology/Oncology: Nubeqa    Other medication(s) to be shipped: No additional medications requested for fill at this time     Changes to insurance: No    Delivery Scheduled: Yes, Expected medication delivery date: 05/01/21.     Medication will be delivered via Next Day Courier to the confirmed prescription address in [facility].    The patient will receive a drug information handout for each medication shipped and additional FDA Medication Guides as required.  Verified that patient has previously received a Conservation officer, historic buildings and a Surveyor, mining.    The patient or caregiver noted above participated in the development of this care plan and knows that they can request review of or adjustments to the care plan at any time.      All of the patient's questions and concerns have been addressed.    Nonie Hoyer   PGY1 Community-based Pharmacy Resident  [facility]    I reviewed this patient case and all documentation provided by the learner and was readily available for consultation during their interaction with the patient.  I agree with the assessment and plan listed below.    [facility] Specialty Pharmacist

## 2021-04-30 MED FILL — NUBEQA 300 MG TABLET: ORAL | 30 days supply | Qty: 120 | Fill #9

## 2021-05-07 ENCOUNTER — Ambulatory Visit: Admit: 2021-05-07 | Discharge: 2021-05-07 | Payer: MEDICARE | Attending: Medical Oncology | Primary: Medical Oncology

## 2021-05-07 ENCOUNTER — Ambulatory Visit
Admit: 2021-05-07 | Discharge: 2021-05-07 | Payer: MEDICARE | Attending: Student in an Organized Health Care Education/Training Program | Primary: Student in an Organized Health Care Education/Training Program

## 2021-05-07 ENCOUNTER — Other Ambulatory Visit: Admit: 2021-05-07 | Discharge: 2021-05-07 | Payer: MEDICARE

## 2021-05-07 DIAGNOSIS — E46 Unspecified protein-calorie malnutrition: Principal | ICD-10-CM

## 2021-05-07 DIAGNOSIS — C61 Malignant neoplasm of prostate: Principal | ICD-10-CM

## 2021-05-07 LAB — COMPREHENSIVE METABOLIC PANEL
ALBUMIN: 4.3 g/dL (ref 3.4–5.0)
ALKALINE PHOSPHATASE: 118 U/L — ABNORMAL HIGH (ref 46–116)
ALT (SGPT): 10 U/L (ref 10–49)
ANION GAP: 7 mmol/L (ref 5–14)
AST (SGOT): 19 U/L (ref <=34)
BILIRUBIN TOTAL: 0.6 mg/dL (ref 0.3–1.2)
BLOOD UREA NITROGEN: 14 mg/dL (ref 9–23)
BUN / CREAT RATIO: 15
CALCIUM: 9.2 mg/dL (ref 8.7–10.4)
CHLORIDE: 103 mmol/L (ref 98–107)
CO2: 26 mmol/L (ref 20.0–31.0)
CREATININE: 0.94 mg/dL
EGFR CKD-EPI (2021) MALE: 82 mL/min/{1.73_m2} (ref >=60)
GLUCOSE RANDOM: 111 mg/dL (ref 70–179)
POTASSIUM: 3.9 mmol/L (ref 3.5–5.1)
PROTEIN TOTAL: 8.1 g/dL (ref 5.7–8.2)
SODIUM: 136 mmol/L (ref 135–145)

## 2021-05-07 LAB — PSA: PROSTATE SPECIFIC ANTIGEN: 67.89 ng/mL — ABNORMAL HIGH (ref 0.00–4.00)

## 2021-05-07 LAB — TESTOSTERONE: TESTOSTERONE TOTAL: <7 ng/dL — ABNORMAL LOW

## 2021-05-07 NOTE — Unmapped (Signed)
Medical Oncology: Prostate Cancer     Assessment:    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  Also has neuroendocrine tumor grade 1 pancreatic mass.  Restaging scans 06/2019 and 10/2018 were stable post-Radium.  He started darolutamide which was discontinued for progression in 04/2021.  He has limited systemic options available, as he likely would not tolerate chemotherapy.      I discussed options today with him and his son, including Provenge, reduced dose docetaxel, continued ADT monotherapy, or supportive care.  They will think about it and we will speak by video visit next week.    Lab Results   Component Value Date    PSA 67.89 (H) 05/07/2021    PSA 43.03 (H) 01/29/2021    PSA 27.63 (H) 10/30/2020    PSA 20.65 (H) 07/31/2020    PSA 17.76 (H) 04/03/2020    PSA 34.50 (H) 11/22/2019       He has had challenges with organizing medications at home.  He lives with his wife who works and is not able to help with this, and his son lives in [place] but is becoming more involved, with a daughter nearby.      He is followed by Palliative Care for back pain in his back at the area of prior surgery where he has a rod.  He is on Oxycodone.  He has few treatment options, and he understands the goals of care are supportive and palliative.  He has done advanced care planning with Palliative Care.    Plan:  - Continue ADT; Eligard every 77-months (01/29/2021).  - Discontinue darolutamide.  - Follow PSA.  - Blood pressure control with Katina Degree as needed.  - Followed by Palliative Care for pain control, thank you.  - Prior increased frequency of urination:  Better since stopping tamsulsoin. PVR has been low.  - LEE, improved with diuretic with his PCP -- he will follow up with PCP about this.  - Follow PSA.  - LEE: Diuretic as needed with his PCP and Palliative Care.  - Code status: He has been preparing a living will with his son and has discussed with with Palliative Care.  He still wishes to be full code at this time.    I spend 42 minutes reviewing data, coordinating care, documenting, and meeting wit th patient on the day of service.    Follow up with me in 1 week about treatment decisions.    ---------------------------------------------------------    HPI:  - Prior patient of Dr. Verl Bangs  - 2013: Presented with metastatic prostate cancer to bone, PSA ~900, started Lupron, then Avodart/Casodex, then abiraterone/Lupron  - 1/19: Experienced cord compression while on Lupron/abiraterone --> RT  - Neuroendocrine tumor grade 1 pancreatic mass will be observed.  - 08/11/17: Bone scan: c/w 5/18, stable metastatic disease in the thoracic spine and right hemipelvis.  Redemonstration of radiotracer uptake in the upper thoracic vertebral body. Slightly decreased uptake within the right hemipelvis, consistent with previously seen mixed sclerotic/lytic lesions. No new areas of radiotracer uptake. Physiologic uptake is seen in the kidneys and bladder.  - 09/08/17: CT A/P: 1. Interval improvement in previously visualized pancreatic tail stranding and free fluid with lobulated intermediate density peripancreatic collection along the pancreatic tail, closely abutting the anterior aspect of the spleen. Findings may represent complex pseudocyst in the setting of prior pancreatitis. MRI MRCP  is recommended for further evaluation as known neuroendocrine pancreatic tumor is not well delineated on this portal venous phase CT and to exclude postcontrast enhancement of the suspected complex pancreatic pseudocyst along the tail.  2. Right lower lobe bronchiectasis with mucous plugging.  3. Hepatic hypodensities, unchanged and favored to represent hepatic cysts. This may be also be confirmed by MRI.  2/19: Discontinued abiraterone  6-11/19: Radium-223  11/02/18: CT A/P: No new lytic or blastic osseous lesions. Similar appearance of mixed sclerotic and lytic lesion in the right hemipelvis.  11/02/18: Bone Scan: Slightly decrease in radiotracer uptake involving the thoracic spine and right hemipelvis when compared to prior imaging.  05/31/19: PSA 60.4  06/25/19: CT A/P: Similar appearance of mixed sclerotic and lytic lesions in the right hemipelvis. No new lytic or blastic lesions.  Similar appearance of hyperenhancing pancreatic body mass, compatible with pancreatic neuroendocrine tumor.  Decreased size of fluid collection adjacent to the pancreatic tail/inferior margin of the spleen.  Bronchiectasis with mucous plugging in the right lower lobe, similar to prior.  06/25/19: Similar focal increase in radiotracer uptake within the right hemipelvis when compared to prior bone scan from 11/02/2018. Stable focal uptake in the upper thoracic vertebral bodies.    ROS: Feeling well today. Pain is stable, no new pain. Urinary frequency better since stopping tamsulosin.    Current Outpatient Medications on File Prior to Visit   Medication Sig Dispense Refill   ??? allopurinoL (ZYLOPRIM) 100 MG tablet Take 1 tablet (100 mg total) by mouth daily. 90 tablet 3   ??? amLODIPine (NORVASC) 5 MG tablet Take 1 tablet by mouth daily.     ??? atorvastatin (LIPITOR) 10 MG tablet Take 10 mg by mouth nightly.      ??? darolutamide (NUBEQA) 300 mg tablet Take 2 tablets (600 mg total) by mouth 2 (two) times a day with meals. Take with food. Swallow tablets whole. 120 tablet 11   ??? ferrous sulfate 325 (65 FE) MG tablet Take 325 mg by mouth daily.     ??? melatonin 3 mg Tab 3 mg daily as needed.     ??? oxyCODONE (ROXICODONE) 5 MG immediate release tablet Take 1 tablet (5 mg total) by mouth every eight (8) hours as needed for pain. 90 tablet 0   ??? oxyCODONE (ROXICODONE) 5 MG immediate release tablet Take 1 tablet (5 mg total) by mouth every eight (8) hours as needed for pain. 90 tablet 0   ??? polyethylene glycol (MIRALAX) 17 gram packet Take 17 g by mouth two (2) times a day as needed.     ??? carvediloL (COREG) 6.25 MG tablet Take 1 tablet (6.25 mg total) by mouth Two (2) times a day. 60 tablet 11     Current Facility-Administered Medications on File Prior to Visit   Medication Dose Route Frequency Provider Last Rate Last Admin   ??? leuprolide (6 month) (ELIGARD) 45 mg injection                PE:  BP 150/85  - Pulse 84  - Temp 36.6 ??C (97.9 ??F) (Oral)  - Resp 16  - Ht 167.6 cm (5' 6)  - Wt 58.8 kg (129 lb 9.6 oz)  - SpO2 100%  - BMI 20.92 kg/m??   NAD  A+Ox3  No rash  Abd ND    Data:    PSA   Date Value Ref Range Status   05/07/2021 67.89 (H) 0.00 - 4.00 ng/mL Final   01/29/2021 43.03 (  H) 0.00 - 4.00 ng/mL Final   10/30/2020 27.63 (H) 0.00 - 4.00 ng/mL Final   07/31/2020 20.65 (H) 0.00 - 4.00 ng/mL Final   04/03/2020 17.76 (H) 0.00 - 4.00 ng/mL Final   11/22/2019 34.50 (H) 0.00 - 4.00 ng/mL Final   09/13/2019 45.90 (H) 0.00 - 4.00 ng/mL Final   06/28/2019 51.50 (H) 0.00 - 4.00 ng/mL Final   05/31/2019 60.40 (H) 0.00 - 4.00 ng/mL Final   08/31/2018 31.30 (H) 0.00 - 4.00 ng/mL Final   07/06/2018 35.00 (H) 0.00 - 4.00 ng/mL Final   06/01/2018 33.10 (H) 0.00 - 4.00 ng/mL Final   05/04/2018 42.00 (H) 0.00 - 4.00 ng/mL Final   03/30/2018 42.70 (H) 0.00 - 4.00 ng/mL Final   01/26/2018 46.20 (H) 0.00 - 4.00 ng/mL Final   08/25/2017 61.50 (H) 0.00 - 4.00 ng/mL Final   08/08/2017 80.90 (H) 0.00 - 4.00 ng/mL Final   05/19/2017 34.70 (H) 0.00 - 4.00 ng/mL Final   02/10/2017 30.50 (H) 0.00 - 4.00 ng/mL Final   01/20/2017 19.60 (H) 0.00 - 4.00 ng/mL Final   01/07/2017 18.70 (H) 0.00 - 4.00 ng/mL Final   12/22/2016 19.40 (H) 0.00 - 4.00 ng/mL Final   12/03/2016 31.70 (H) 0.00 - 4.00 ng/mL Final   11/04/2016 30.30 (H) 0.00 - 4.00 ng/mL Final   08/06/2016 18.80 (H) 0.00 - 4.00 ng/mL Final   05/06/2016 24.10 (H) 0.00 - 4.00 ng/mL Final   01/29/2016 12.50 (H) 0.00 - 4.00 ng/mL Final   10/30/2015 11.10 (H) 0.00 - 4.00 ng/mL Final   07/24/2015 7.41 (H) 0.00 - 4.00 ng/mL Final   04/03/2015 3.44 0.00 - 4.00 ng/mL Final   12/23/2014 12.50 (H) 0.00 - 4.00 ng/mL Final

## 2021-05-07 NOTE — Unmapped (Signed)
OUTPATIENT ONCOLOGY PALLIATIVE CARE    Principal Diagnosis: [name] is a 80 y.o. 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     Assessment/Plan:   1.  Upper back pain due to bone mets from prostate cancer-controlled with current regimen and no longer on decadron, as well as chronic pain from previous back surgery 3+ years ago.  Pain has been adequately controlled on oxycodone as needed.  -Continue oxycodone 5 mg every 8 hours as needed pain-averaging 2-3 tabs/day, new prescription to be written to be filled on or after 9/30.        2. Support-generally doing okay currently  -Receptive to [facility] home-based palliative care in addition to our team's for support.  Will connect with Allegra Lai RN for order placement.  -Encouraged covid booster. Does have a contact to get this scheduled.       3. Advance care planning-at initial visit introduced idea of advance care planning today.  Son Stephen Bartlett expressed clear desire to be straight forward and to the point.  Currently they do not feel like talking about use and hypothetical situations is particularly helpful.  He states he is a Clinical research associate that frequently does advance directives and he will plan to complete these with his father.  Introduced the idea that if/when we are at a point where certain decisions need to be made we are available to have these conversations.    After our visit with patient and his son, conversation held with Dr. [name] and unfortunately his cancer continues to progress, and further cancer directed treatment unlikely to provide significantly more benefit.  Recommendation for hospice in the near future likely appropriate.  We will remain available to help support these conversations if helpful.    Brought living well to previous encounter, although it does not appear to be scanned into the chart.  Also reviewed CODE STATUS, and Dr. [name] and I made our recommendation to change his CODE STATUS to DNR given his advanced cancer, overall frailty, and desire to avoid suffering.  They stated that he and his son will talk about it and let us know if they want to change his CODE STATUS.    -At prior visits: still with no healthcare power of attorney or living will.   - Son-Stephen is receptive to having these completed.  Stephen works at a  [employer], water [employer].  Shared the prepare for your care form with his son via text.  -HCDM-son Stephen Bartlett.  -NP did discuss that in the state of [place], his wife will be his agent unless he does get this in writing.  -Patient wants his son Stephen Bartlett to be his primary agent, but he does not have advanced care planning documents.  His secondary agent would be his spouse.  -Discussed values and maintaining his independence and his relationship with his son are important to him.  His strengths include his religious faith and is grateful for his family and a primary goal is being able to live independently.      # Controlled substances risk management.   - Patient does not have a signed pain medication agreement with our team, but this was discussed verbally.   - [place]CC[place] database was reviewed today and it was appropriate.   - Urine drug screen was not performed at this visit. Findings: not applicable.   - Patient has received information about safe storage and administration of medications.   - Patient has not received a prescription for narcan.  F/u: 10 to 12 weeks with next oncology appointment  ----------------------------------------  Referring Provider: Dr. [name]  Oncology Team: Dr. 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  In January 2019, patient had cord compression and subsequent surgery & XRT with a rod from C7-T8.  His prior tx have been Radium 223, Avodart/Casodex and abiraterone/Lupron. Restaging scans 3/20 (post-Redium) are stable/slightly improved. He is also being followed for a neuroendocrine tumor grade 1 pancreatic mass.     Patient's primary symptom is his upper back pain which is exacerbated by changes in the weather.  He reports that the pain is currently at 7/10, but he has not taken the a.m. ibuprofen 600 mg and the Decadron 2 mg.  He does find some relief with this.  Has some difficulty using the pain scores.  Feels that he still not getting adequate amount of relief.  He describes the pain as a numbing and aching sensation.  He had tried Tylenol in the past with no relief. Does endorse history of marijuana use.  No other illicit drug use and opioid risk assessment tool was done today.    Patient shared that when he had his spine surgery he could not walk and it took much time and energy for him to regain his strength and walk again. He still drives and is able to do some cooking in the home.  He is independent with his personal ADLs.    ??  Current cancer-directed therapy: ADT- Eligard 45 (6 month injection)  and  Darolutamide       Interval hx: Briefly checked in today.  States overall he is doing well and has no major concerns.  Continues to take oxycodone intermittently.  Expresses gratitude for the fact that he is doing as well as he is.  Thankful for the support of his son.  No other questions or concerns.    Symptom Review:  General: Overall continues doing fairly well.  Pain: See above  Fatigue: Energy good-remains active. Drives, shops and visits sister who lives locally.  Mobility: Able to do the things that he wants to do.  Does have a cane if he leaves the home.  Sleep: No concerns  Appetite: Describes as good.  Bowel function: See above  Dyspnea: None  Mood: Good, no issues with anxiety or depression.    Palliative Performance Scale: 80% - Ambulation: Full / Normal Activity with effort, some evidence of disease / Self-Care:Full / Intake: Normal or reduced / Level of Conscious: Full      Coping/Support Issues: Has the support of his spiritual practice.  He had been attending the [employer] on a regular basis prior to  COVID-19.  His most supportive people are his son, who is a Clinical research associate in [place] and his sister who is a Runner, broadcasting/film/video in [place].  He also has a sister who is a retired Charity fundraiser that lives about 1 hour away.  He does live with his wife since 89.  It is a second marriage for him.    Goals of Care: To feel better    Social History:   Name of primary support: Son, wife and sister  Occupation: Retired.  He had worked in the [employer]  Hobbies: Enjoys visiting family and sitting on the front porch.  Current residence / distance from [facility]: lives in [place].  45 minutes from [facility] [place].  Shares  it is easy to get to the [facility] campus    Advance Care Planning:   HCPOA:  Natural surrogate decision maker: His son would be his primary agent and his wife would be the secondary  Living Will: No  ACP note:   CODE STATUS: Full    Objective     Opioid Risk Tool:    Male  Male    Family history of substance abuse      Alcohol  1  3    Illegal drugs  2  3    Rx drugs  4  4    Personal history of substance abuse      Alcohol  3  3    Illegal drugs  4  4    Rx drugs  5  5    Age between 16--45 years  1  1    History of preadolescent sexual abuse  3  0    Psychological disease      ADD, OCD, bipolar, schizophrenia  2  2    Depression  1  1       Total: 0  (<3 low risk, 4-7 moderate risk, >8 high risk)    Oncology History   Malignant neoplasm of prostate (CMS-HCC)   09/30/2011 Initial Diagnosis    Malignant neoplasm of prostate (CMS-HCC)     11/23/2018 Endocrine/Hormone Therapy    OP LEUPROLIDE (ELIGARD) 45 MG EVERY 6 MONTHS  Plan Provider: Chrisandra Netters, MD     Bone metastases (CMS-HCC)   08/24/2017 Initial Diagnosis    Bone metastases (CMS-HCC)     11/23/2018 Endocrine/Hormone Therapy    OP LEUPROLIDE (ELIGARD) 45 MG EVERY 6 MONTHS  Plan Provider: Chrisandra Netters, MD     01/29/2021 -  Cancer Staged    Staging form: Bone - Appendicular Skeleton, Trunk, Skull, and Facial Bones, AJCC 8th Edition  - Clinical stage from 01/29/2021: Stage IVB (ycTX, cNX, pM1b) - Signed by Chrisandra Netters, MD on 01/29/2021           Patient Active Problem List   Diagnosis   ??? Malignant neoplasm of prostate (CMS-HCC)   ??? 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 Hypertension   ??? Calculus of kidney   ??? Acute renal failure (CMS-HCC)   ??? Foreign body in bladder and urethra   ??? Uric acid nephrolithiasis   ??? Renal cyst, acquired, left   ??? Pancreatic mass   ??? Acute pancreatitis   ??? Bronchiectasis (CMS-HCC)   ??? Acute kidney injury (CMS-HCC)   ??? Lesion of pancreas   ??? Bone metastases (CMS-HCC)       Past Medical History:   Diagnosis Date   ??? Calculus of kidney    ??? Hypertension    ??? Malignant neoplasm of prostate (CMS-HCC)        Past Surgical History:   Procedure Laterality Date   ??? PR ARTHRD PST/PSTLAT TQ 1NTRSPC CRV BELW C2 SEGMENT Midline 08/05/2017    Procedure: ARTHRODESIS, POSTERIOR OR POSTEROLATERAL TECHNIQUE, SINGLE LEVEL; CERVICAL BELOW C2 SEGMENT;  Surgeon: Timothy Lasso, MD;  Location[facility]IN [facility];  Service: Ortho Spine   ??? PR ARTHRODESIS PST/PSTLAT TQ 1NTRSPC EA ADDL NTRSPC Midline 08/05/2017    Procedure: ARTHRODESIS, POSTERIOR OR POSTEROLATERAL TECHNIQUE, SINGLE LEVEL; EACH ADDITIONAL VERTEBRAL SEGMENT;  Surgeon: Timothy Lasso, MD;  Location: MAIN OR [facility]  Service: Ortho Spine   ??? PR LAM FACETECTOMY&FORAMOT 1 VRT SGM EA ADDL SGM Midline 08/05/2017  Procedure: LAMINECTMY 1 SEGMT-UNI/BIL; EA ADD CERV/THOR/LUM;  Surgeon: Timothy Lasso, MD;  Location: MAIN OR [facility];  Service: Ortho Spine   ??? PR LAMINEC/FACETECT/FORAMIN,CERVICAL 1 SEG Midline 08/05/2017    Procedure: LAMINECTOMY SNGL VERTEBRAL SEGMT-UNI/BIL; CERV;  Surgeon: Timothy Lasso, MD;  Location: MAIN OR [facility];  Service: Ortho Spine   ??? PR POSTERIOR SEGMENTAL INSTRUMENTATION 7-12 VRT SEG Midline 08/05/2017    Procedure: POSTERIOR SEGMENTAL INSTRUMENTATION; (EG, PEDICLE FIXATION, DUAL RODS W/MULT HOOKS/WIRES) 7-12 VERTEB SEGMT;  Surgeon: Timothy Lasso, MD;  Location: MAIN OR [facility];  Service: Ortho Spine   ??? PR UPGI ENDOSCOPY,FN NEEDLE BX,GUIDED N/A 02/11/2017    Procedure: UGI W/TRANSENDOSCOPIC US-GUIDE INTRA/TRANSMURAL NEEDLE ASP/BX (INCL EXAM ESOPHAGUS, STOMACH, DOUDENUM/JEJ);  Surgeon: Vonda Antigua, MD;  [facility]: [facility];  Service: Gastroenterology   ??? URETEROSCOPY         Current Outpatient Medications   Medication Sig Dispense Refill   ??? allopurinoL (ZYLOPRIM) 100 MG tablet Take 1 tablet (100 mg total) by mouth daily. 90 tablet 3   ??? amLODIPine (NORVASC) 5 MG tablet Take 1 tablet by mouth daily.     ??? atorvastatin (LIPITOR) 10 MG tablet Take 10 mg by mouth nightly.      ??? carvediloL (COREG) 6.25 MG tablet Take 1 tablet (6.25 mg total) by mouth Two (2) times a day. 60 tablet 11   ??? darolutamide (NUBEQA) 300 mg tablet Take 2 tablets (600 mg total) by mouth 2 (two) times a day with meals. Take with food. Swallow tablets whole. 120 tablet 11   ??? ferrous sulfate 325 (65 FE) MG tablet Take 325 mg by mouth daily.     ??? oxyCODONE (ROXICODONE) 5 MG immediate release tablet Take 1 tablet (5 mg total) by mouth every eight (8) hours as needed for pain. 90 tablet 0   ??? oxyCODONE (ROXICODONE) 5 MG immediate release tablet Take 1 tablet (5 mg total) by mouth every eight (8) hours as needed for pain. 90 tablet 0   ??? polyethylene glycol (MIRALAX) 17 gram packet Take 17 g by mouth two (2) times a day as needed.       No current facility-administered medications for this visit.     Facility-Administered Medications Ordered in Other Visits   Medication Dose Route Frequency Provider Last Rate Last Admin   ??? leuprolide (6 month) (ELIGARD) 45 mg injection                Allergies: No Known Allergies    Family History:  Cancer-related family history includes Prostate cancer in an other family member. There is no history of Kidney cancer.  He indicated that the status of his mother is unknown. He indicated that the status of his neg hx is unknown. He indicated that the status of his other is unknown.        Lab Results   Component Value Date    CREATININE 0.94 05/07/2021     Lab Results   Component Value Date    ALKPHOS 118 (H) 05/07/2021    BILITOT 0.6 05/07/2021    PROT 8.1 05/07/2021    ALBUMIN 4.3 05/07/2021    ALT 10 05/07/2021    AST 19 05/07/2021     Physical exam:  General: Overall well-appearing man, frail, but in no distress  Pulmonary: No increased work of breathing  Skin: No rashes or skin breakdown noted on visible skin  Neurological: Normal gait, no acute deficits  Psychiatric: Mood hanging in there,  affect congruent and full range    I personally spent 15 minutes face-to-face and non-face-to-face in the care of this patient, which includes all pre, intra, and post visit time on the date of service.     Kathlynn Grate, MD  [facility] Outpatient Oncology Palliative Care

## 2021-05-13 MED ORDER — OXYCODONE 5 MG TABLET
ORAL_TABLET | Freq: Three times a day (TID) | ORAL | 0 refills | 30 days | Status: CP | PRN
Start: 2021-05-13 — End: ?

## 2021-05-14 ENCOUNTER — Ambulatory Visit: Admit: 2021-05-14 | Discharge: 2021-05-15 | Payer: MEDICARE | Attending: Medical Oncology | Primary: Medical Oncology

## 2021-05-14 DIAGNOSIS — C61 Malignant neoplasm of prostate: Principal | ICD-10-CM

## 2021-05-14 DIAGNOSIS — E46 Unspecified protein-calorie malnutrition: Principal | ICD-10-CM

## 2021-05-14 NOTE — Unmapped (Unsigned)
Called patient to remind them of virtual visit, message left on phone as there was no answer.

## 2021-05-14 NOTE — Unmapped (Unsigned)
Medical Oncology: Prostate Cancer     Assessment:    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  Also has neuroendocrine tumor grade 1 pancreatic mass.  Restaging scans 06/2019 and 10/2018 were stable post-Radium.  He started darolutamide which was discontinued for progression in 04/2021.  He has limited systemic options available, as he likely would not tolerate chemotherapy.      I discussed options today with him and his son, including Provenge, reduced dose docetaxel, continued ADT monotherapy, or supportive care.  They will think about it and we will speak by video visit next week.    Lab Results   Component Value Date    PSA 67.89 (H) 05/07/2021    PSA 43.03 (H) 01/29/2021    PSA 27.63 (H) 10/30/2020    PSA 20.65 (H) 07/31/2020    PSA 17.76 (H) 04/03/2020    PSA 34.50 (H) 11/22/2019       He has had challenges with organizing medications at home.  He lives with his wife who works and is not able to help with this, and his son lives in [place] but is becoming more involved, with a daughter nearby.      He is followed by Palliative Care for back pain in his back at the area of prior surgery where he has a rod.  He is on Oxycodone.  He has few treatment options, and he understands the goals of care are supportive and palliative.  He has done advanced care planning with Palliative Care.    Plan:  - Continue ADT; Eligard every 72-months (01/29/2021).  - Follow PSA.  - Blood pressure control with Katina Degree as needed.  - Followed by Palliative Care for pain control, thank you.  - Prior increased frequency of urination:  Better since stopping tamsulsoin. PVR has been low.  - LEE, improved with diuretic with his PCP -- he will follow up with PCP about this.  - Follow PSA.  - LEE: Diuretic as needed with his PCP and Palliative Care.  - Code status: He has been preparing a living will with his son and has discussed with with Palliative Care.  He still wishes to be full code at this time.    I spend 42 minutes reviewing data, coordinating care, documenting, and meeting wit th patient on the day of service.    Follow up with me in 1 week about treatment decisions.    ---------------------------------------------------------    HPI:  - Prior patient of Dr. Verl Bangs  - 2013: Presented with metastatic prostate cancer to bone, PSA ~900, started Lupron, then Avodart/Casodex, then abiraterone/Lupron  - 1/19: Experienced cord compression while on Lupron/abiraterone --> RT  - Neuroendocrine tumor grade 1 pancreatic mass will be observed.  - 08/11/17: Bone scan: c/w 5/18, stable metastatic disease in the thoracic spine and right hemipelvis.  Redemonstration of radiotracer uptake in the upper thoracic vertebral body. Slightly decreased uptake within the right hemipelvis, consistent with previously seen mixed sclerotic/lytic lesions. No new areas of radiotracer uptake. Physiologic uptake is seen in the kidneys and bladder.  - 09/08/17: CT A/P: 1. Interval improvement in previously visualized pancreatic tail stranding and free fluid with lobulated intermediate density peripancreatic collection along the pancreatic tail, closely abutting the anterior aspect of the spleen. Findings may represent complex pseudocyst in the setting of prior pancreatitis. MRI MRCP is recommended for further  evaluation as known neuroendocrine pancreatic tumor is not well delineated on this portal venous phase CT and to exclude postcontrast enhancement of the suspected complex pancreatic pseudocyst along the tail.  2. Right lower lobe bronchiectasis with mucous plugging.  3. Hepatic hypodensities, unchanged and favored to represent hepatic cysts. This may be also be confirmed by MRI.  2/19: Discontinued abiraterone  6-11/19: Radium-223  11/02/18: CT A/P: No new lytic or blastic osseous lesions. Similar appearance of mixed sclerotic and lytic lesion in the right hemipelvis.  11/02/18: Bone Scan: Slightly decrease in radiotracer uptake involving the thoracic spine and right hemipelvis when compared to prior imaging.  05/31/19: PSA 60.4  06/25/19: CT A/P: Similar appearance of mixed sclerotic and lytic lesions in the right hemipelvis. No new lytic or blastic lesions.  Similar appearance of hyperenhancing pancreatic body mass, compatible with pancreatic neuroendocrine tumor.  Decreased size of fluid collection adjacent to the pancreatic tail/inferior margin of the spleen.  Bronchiectasis with mucous plugging in the right lower lobe, similar to prior.  06/25/19: Similar focal increase in radiotracer uptake within the right hemipelvis when compared to prior bone scan from 11/02/2018. Stable focal uptake in the upper thoracic vertebral bodies.    ROS: Feeling well today. Pain is stable, no new pain. Urinary frequency better since stopping tamsulosin.    Current Outpatient Medications on File Prior to Visit   Medication Sig Dispense Refill   ??? allopurinoL (ZYLOPRIM) 100 MG tablet Take 1 tablet (100 mg total) by mouth daily. 90 tablet 3   ??? amLODIPine (NORVASC) 5 MG tablet Take 1 tablet by mouth daily.     ??? atorvastatin (LIPITOR) 10 MG tablet Take 10 mg by mouth nightly.      ??? carvediloL (COREG) 6.25 MG tablet Take 1 tablet (6.25 mg total) by mouth Two (2) times a day. 60 tablet 11   ??? ferrous sulfate 325 (65 FE) MG tablet Take 325 mg by mouth daily.     ??? melatonin 3 mg Tab 3 mg daily as needed.     ??? oxyCODONE (ROXICODONE) 5 MG immediate release tablet Take 1 tablet (5 mg total) by mouth every eight (8) hours as needed for pain. 90 tablet 0   ??? polyethylene glycol (MIRALAX) 17 gram packet Take 17 g by mouth two (2) times a day as needed.       Current Facility-Administered Medications on File Prior to Visit   Medication Dose Route Frequency Provider Last Rate Last Admin   ??? leuprolide (6 month) (ELIGARD) 45 mg injection                PE:  There were no vitals taken for this visit.  NAD  A+Ox3  No rash  Abd ND    Data:    PSA   Date Value Ref Range Status   05/07/2021 67.89 (H) 0.00 - 4.00 ng/mL Final   01/29/2021 43.03 (H) 0.00 - 4.00 ng/mL Final   10/30/2020 27.63 (H) 0.00 - 4.00 ng/mL Final   07/31/2020 20.65 (H) 0.00 - 4.00 ng/mL Final   04/03/2020 17.76 (H) 0.00 - 4.00 ng/mL Final   11/22/2019 34.50 (H) 0.00 - 4.00 ng/mL Final   09/13/2019 45.90 (H) 0.00 - 4.00 ng/mL Final   06/28/2019 51.50 (H) 0.00 - 4.00 ng/mL Final   05/31/2019 60.40 (H) 0.00 - 4.00 ng/mL Final   08/31/2018 31.30 (H) 0.00 - 4.00 ng/mL Final   07/06/2018 35.00 (H) 0.00 - 4.00 ng/mL Final   06/01/2018 33.10 (H)  0.00 - 4.00 ng/mL Final   05/04/2018 42.00 (H) 0.00 - 4.00 ng/mL Final   03/30/2018 42.70 (H) 0.00 - 4.00 ng/mL Final   01/26/2018 46.20 (H) 0.00 - 4.00 ng/mL Final   08/25/2017 61.50 (H) 0.00 - 4.00 ng/mL Final   08/08/2017 80.90 (H) 0.00 - 4.00 ng/mL Final   05/19/2017 34.70 (H) 0.00 - 4.00 ng/mL Final   02/10/2017 30.50 (H) 0.00 - 4.00 ng/mL Final   01/20/2017 19.60 (H) 0.00 - 4.00 ng/mL Final   01/07/2017 18.70 (H) 0.00 - 4.00 ng/mL Final   12/22/2016 19.40 (H) 0.00 - 4.00 ng/mL Final   12/03/2016 31.70 (H) 0.00 - 4.00 ng/mL Final   11/04/2016 30.30 (H) 0.00 - 4.00 ng/mL Final   08/06/2016 18.80 (H) 0.00 - 4.00 ng/mL Final   05/06/2016 24.10 (H) 0.00 - 4.00 ng/mL Final   01/29/2016 12.50 (H) 0.00 - 4.00 ng/mL Final   10/30/2015 11.10 (H) 0.00 - 4.00 ng/mL Final   07/24/2015 7.41 (H) 0.00 - 4.00 ng/mL Final   04/03/2015 3.44 0.00 - 4.00 ng/mL Final   12/23/2014 12.50 (H) 0.00 - 4.00 ng/mL Final

## 2021-05-15 NOTE — Unmapped (Signed)
Attempted to reach patient and son [name]) to see if they had discussed how they would like to move forward with patient's care. No answer. Will continue to try to reach.       Baxter Hire, RN

## 2021-05-26 NOTE — Unmapped (Signed)
The [facility] has made a third and final attempt to reach this patient to refill the following medication:Nubeqa.      We have left voicemails on the following phone numbers: [phone] , have been unable to leave messages on the following phone numbers: [phone] and have sent a MyChart message.    Dates contacted: 9/22,28,10/4  Last scheduled delivery: 04/30/21    The patient may be at risk of non-compliance with this medication. The patient should call the [facility] at [phone]  Option 4, then Option 1 (oncology) to refill medication.    Stephen Bartlett Stephen Bartlett   [facility] Specialty Technician

## 2021-05-29 NOTE — Unmapped (Signed)
TC to patient & all emergency contacts. No answer, left VM.     Baxter Hire, RN

## 2021-06-03 NOTE — Unmapped (Signed)
See ongoing telephone note from 05/15/21

## 2021-06-04 ENCOUNTER — Other Ambulatory Visit: Payer: Medicare HMO | Admitting: Nurse Practitioner

## 2021-06-04 ENCOUNTER — Other Ambulatory Visit: Payer: Self-pay

## 2021-06-04 ENCOUNTER — Telehealth: Payer: Self-pay | Admitting: Nurse Practitioner

## 2021-06-04 NOTE — Telephone Encounter (Signed)
I called [name] to confirm pc f/u visit. [name] endorses he will need to reschedule he was not at home. [name] requested to be called later to reschedule.

## 2021-06-18 NOTE — Unmapped (Signed)
TC to speak to patient & or son re: decision (Provenge vs hospice). Unable to reach patient & son. Dr. [name] aware.       Baxter Hire, RN

## 2021-07-02 NOTE — Unmapped (Signed)
The [facility] has made a fourth and final attempt to reach this patient to refill the following medication:darolutamide.      We have left voicemails on the following phone numbers: [phone] and have been unable to leave messages on the following phone numbers: [phone] (number disconnected/no longer in service).    Dates contacted: 09/22; 09/28; 10/04; 11/10  Last scheduled delivery: 04/30/21    The patient may be at risk of non-compliance with this medication. The patient should call the [facility] at [phone]  Option 4, then Option 1 (oncology) to refill medication.    Breck Coons [facility] Specialty Pharmacist

## 2021-07-13 DIAGNOSIS — G893 Neoplasm related pain (acute) (chronic): Principal | ICD-10-CM

## 2021-07-13 DIAGNOSIS — C7951 Secondary malignant neoplasm of bone: Principal | ICD-10-CM

## 2021-07-13 DIAGNOSIS — C61 Malignant neoplasm of prostate: Principal | ICD-10-CM

## 2021-07-13 MED ORDER — OXYCODONE 5 MG TABLET
ORAL_TABLET | Freq: Three times a day (TID) | ORAL | 0 refills | 30.00000 days | Status: CP | PRN
Start: 2021-07-13 — End: ?

## 2021-07-13 NOTE — Unmapped (Signed)
Hi,    Patient Stephen Bartlett called requesting a medication refill for the following:    ??? Medication: Oxycodone  ??? Dosage: 5 mg  ??? Days left of medication: 0  ??? Pharmacy: [facility] Fax [phone]      Check Indicates criteria has been reviewed and confirmed with the patient:    [x]  Preferred Name   [x]  DOB and/or MR#  [x]  Preferred Contact Method  [x]  Phone Number(s)   [x]  Preferred Pharmacy   [x]  MyChart     Thank you,  Cipriano Mile  [facility]  [phone]

## 2021-08-26 ENCOUNTER — Telehealth: Payer: Self-pay | Admitting: Nurse Practitioner

## 2021-08-26 DIAGNOSIS — C7951 Secondary malignant neoplasm of bone: Principal | ICD-10-CM

## 2021-08-26 DIAGNOSIS — C61 Malignant neoplasm of prostate: Principal | ICD-10-CM

## 2021-08-26 DIAGNOSIS — G893 Neoplasm related pain (acute) (chronic): Principal | ICD-10-CM

## 2021-08-26 MED ORDER — OXYCODONE 5 MG TABLET
ORAL_TABLET | Freq: Three times a day (TID) | ORAL | 0 refills | 30 days | Status: CP | PRN
Start: 2021-08-26 — End: ?

## 2021-08-26 NOTE — Telephone Encounter (Signed)
Spoke with patient and have scheduled a Palliative f/u visit fopr 09/10/21 @ 3:30 PM.

## 2021-09-10 ENCOUNTER — Other Ambulatory Visit: Payer: Medicare HMO | Admitting: Nurse Practitioner

## 2021-09-10 ENCOUNTER — Other Ambulatory Visit: Payer: Self-pay

## 2021-09-10 ENCOUNTER — Encounter: Payer: Self-pay | Admitting: Nurse Practitioner

## 2021-09-10 DIAGNOSIS — C7951 Secondary malignant neoplasm of bone: Secondary | ICD-10-CM

## 2021-09-10 DIAGNOSIS — R5381 Other malaise: Secondary | ICD-10-CM

## 2021-09-10 DIAGNOSIS — Z515 Encounter for palliative care: Secondary | ICD-10-CM

## 2021-09-10 NOTE — Progress Notes (Signed)
[place] Consult Note Telephone: [phone]  Fax: [phone]    Date of encounter: 09/10/21 4:41 PM PATIENT NAME: [name] [name] [address] [place] [address]   [phone] (home)  DOB: 1940-12-06 MRN: [id number] PRIMARY CARE PROVIDER:    [facility] RESPONSIBLE PARTY:    Contact Information     Name Relation Home Work Mobile   [name] [name] Spouse  [phone]    [name] [name]   [phone]      Due to the COVID-19 crisis, this visit was done via telemedicine from my office and it was initiated and consent by this patient and or family.  I connected with  [name] [name] on 09/10/21 by a telephone as video not available enabled telemedicine application and verified that I am speaking with the correct person using two identifiers.   I discussed the limitations of evaluation and management by telemedicine. The patient expressed understanding and agreed to proceed. Palliative Care was asked to follow this patient by consultation request of  No ref. provider found to address advance care planning and complex medical decision making. This is a follow up visit.                                  ASSESSMENT AND PLAN / RECOMMENDATIONS: Symptom Management/Plan: 1. ACP: Full code, ongoing discussions with [name]/[name] [name] with treatment options for further planning as cancer continues to progress.    2. Debility secondary to upper back pain due to bone metastasis from prostate cancer encourage mobility as able. Continue on current pain regimen with oxycodone 5 mg every 8 hours as needed pain-averaging 2-3 tabs/day through [facility]. [name] [name] endorses pain is at a comfortable level. [name] [name] continues to drive.    3. Palliative care encounter; Palliative care encounter; Palliative medicine team will continue to support patient, patient's family, and medical team. Visit consisted of counseling and  education dealing with the complex and emotionally intense issues of symptom management and palliative care in the setting of serious and potentially life-threatening illness  Follow up Palliative Care Visit: Palliative care will continue to follow for complex medical decision making, advance care planning, and clarification of goals. Return 4 weeks or prn.  I spent 48 minutes providing this consultation. More than 50% of the time in this consultation was spent in counseling and care coordination.  PPS: 50%  Chief Complaint: Follow up palliative consult for complex medical decision making  HISTORY OF PRESENT ILLNESS:  [name] [name] is a 81 y.o. 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  Neuroendocrine tumor grade 1 pancreatic mass. Restaging scans 06/2019 and 10/2018 have been stable post-Radium. Stable scans, PSA has been rising, from 31 in 08/2018 to 60 in 05/2019, Prostatitis, hypertension, hyperlipidemia, history of back surgery. I called [name] [name] to confirm in person follow up pallative care visit. [name] [name] requested to switch from in person to telemedicine telephonic as video not available. [name] [name] and I proceeded with visit, talked about purpose of pc visit. We talked about symptoms including pain which has been controlled, he is doing well. He goes and visits family, continues to drive. [name] [name] endorses his wife works during the day, he gets bored at home. No recent falls. [name] [name] endorses no further problems  with sob. We talked his appetite. [name] [name] endorses he feels like appetite is good, does not think he has lost more weight. We talked about food that he likes, nutrition. We talked about insomnia symptoms. Discussed sleep patterns, sleep hygiene. Medications reviewed. We talked about medical goals of care. We talked about his understanding of  cancer, progression, treatment. [name] [name] endorses his son, [name] [name] helps him manage decision, understanding his condition, and things that he needs. We talked about [name] [name] functional ability, ambulatory with cane. We talked about role PC in poc. [name] [name] talked at length about multiple topics, supportive role which extended visit. Discussed f/u pc visit, scheduled per [name]. [name]. Therapeutic listening, emotional support provided. Will contact [name] [name], [name] [name] son for update on visit, further discussion goc.  ROS 10 point system reviewed with [name] [name] all negative except HPI  Physical Exam: deferred  Thank you for the opportunity to participate in the care of [name]. [name].  The palliative care team will continue to follow. Please call our office at [phone] if we can be of additional assistance.   This chart was dictated using voice recognition software.  Despite best efforts to proofread,  errors can occur which can change the documentation meaning.   Questions and concerns were addressed. The patient/family was encouraged to call with questions and/or concerns. My contact information was provided. Provided general support and encouragement, no other unmet needs identified   [name] Philbrook Ihor Gully, NP

## 2021-10-01 DIAGNOSIS — C61 Malignant neoplasm of prostate: Principal | ICD-10-CM

## 2021-10-01 DIAGNOSIS — C7951 Secondary malignant neoplasm of bone: Principal | ICD-10-CM

## 2021-10-01 NOTE — Unmapped (Signed)
TC to Mr. Beasley's son, [name], tried [name]'s cell, but no one would speak when the call went through. Called the work number and was routed to [employer]. [name] is out of town and thought he had asked for the appts to be rescheduled. He is asking if the Palliative appt can be rescheduled as a virtual at their next available, starting next week.    He is asking if the appt with Dr. [name] can be rescheduled to 2/16, next Thursday.    Messages forwarded to both team MDs and schedulers to ask for these changes.

## 2021-10-08 ENCOUNTER — Ambulatory Visit: Admit: 2021-10-08 | Discharge: 2021-10-08 | Payer: MEDICARE | Attending: Medical Oncology | Primary: Medical Oncology

## 2021-10-08 ENCOUNTER — Other Ambulatory Visit: Admit: 2021-10-08 | Discharge: 2021-10-08 | Payer: MEDICARE

## 2021-10-08 DIAGNOSIS — C61 Malignant neoplasm of prostate: Principal | ICD-10-CM

## 2021-10-08 DIAGNOSIS — C7951 Secondary malignant neoplasm of bone: Principal | ICD-10-CM

## 2021-10-08 DIAGNOSIS — G893 Neoplasm related pain (acute) (chronic): Principal | ICD-10-CM

## 2021-10-08 LAB — COMPREHENSIVE METABOLIC PANEL
ALBUMIN: 4 g/dL (ref 3.4–5.0)
ALKALINE PHOSPHATASE: 97 U/L (ref 46–116)
ALT (SGPT): 10 U/L (ref 10–49)
ANION GAP: 8 mmol/L (ref 5–14)
AST (SGOT): 19 U/L (ref <=34)
BILIRUBIN TOTAL: 0.5 mg/dL (ref 0.3–1.2)
BLOOD UREA NITROGEN: 13 mg/dL (ref 9–23)
BUN / CREAT RATIO: 14
CALCIUM: 9.6 mg/dL (ref 8.7–10.4)
CHLORIDE: 104 mmol/L (ref 98–107)
CO2: 30 mmol/L (ref 20.0–31.0)
CREATININE: 0.91 mg/dL
EGFR CKD-EPI (2021) MALE: 86 mL/min/{1.73_m2} (ref >=60)
GLUCOSE RANDOM: 113 mg/dL (ref 70–179)
POTASSIUM: 3.9 mmol/L (ref 3.5–5.1)
PROTEIN TOTAL: 8.2 g/dL (ref 5.7–8.2)
SODIUM: 142 mmol/L (ref 135–145)

## 2021-10-08 LAB — PSA: PROSTATE SPECIFIC ANTIGEN: 88.52 ng/mL — ABNORMAL HIGH (ref 0.00–4.00)

## 2021-10-08 LAB — CBC W/ AUTO DIFF
BASOPHILS ABSOLUTE COUNT: 0 10*9/L (ref 0.0–0.1)
BASOPHILS RELATIVE PERCENT: 0.3 %
EOSINOPHILS ABSOLUTE COUNT: 0.1 10*9/L (ref 0.0–0.5)
EOSINOPHILS RELATIVE PERCENT: 0.9 %
HEMATOCRIT: 34.6 % — ABNORMAL LOW (ref 39.0–48.0)
HEMOGLOBIN: 11.1 g/dL — ABNORMAL LOW (ref 12.9–16.5)
LYMPHOCYTES ABSOLUTE COUNT: 1.4 10*9/L (ref 1.1–3.6)
LYMPHOCYTES RELATIVE PERCENT: 22.2 %
MEAN CORPUSCULAR HEMOGLOBIN CONC: 32 g/dL (ref 32.0–36.0)
MEAN CORPUSCULAR HEMOGLOBIN: 29.7 pg (ref 25.9–32.4)
MEAN CORPUSCULAR VOLUME: 92.6 fL (ref 77.6–95.7)
MEAN PLATELET VOLUME: 7.3 fL (ref 6.8–10.7)
MONOCYTES ABSOLUTE COUNT: 0.5 10*9/L (ref 0.3–0.8)
MONOCYTES RELATIVE PERCENT: 8.3 %
NEUTROPHILS ABSOLUTE COUNT: 4.4 10*9/L (ref 1.8–7.8)
NEUTROPHILS RELATIVE PERCENT: 68.3 %
PLATELET COUNT: 247 10*9/L (ref 150–450)
RED BLOOD CELL COUNT: 3.73 10*12/L — ABNORMAL LOW (ref 4.26–5.60)
RED CELL DISTRIBUTION WIDTH: 15.3 % — ABNORMAL HIGH (ref 12.2–15.2)
WBC ADJUSTED: 6.5 10*9/L (ref 3.6–11.2)

## 2021-10-08 LAB — TESTOSTERONE: TESTOSTERONE TOTAL: <7 ng/dL — ABNORMAL LOW

## 2021-10-08 MED ORDER — OXYCODONE 5 MG TABLET
ORAL_TABLET | Freq: Three times a day (TID) | ORAL | 0 refills | 30 days | Status: CP | PRN
Start: 2021-10-08 — End: ?

## 2021-10-08 MED ADMIN — leuprolide acetate (6 month) (ELIGARD) injection 45 mg: 45 mg | SUBCUTANEOUS | @ 17:00:00 | Stop: 2021-10-08

## 2021-10-08 NOTE — Unmapped (Signed)
Medical Oncology: Prostate Cancer     Assessment:    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  Also has neuroendocrine tumor grade 1 pancreatic mass.  Restaging scans 06/2019 and 10/2018 were stable post-Radium.  He started darolutamide which was discontinued for progression in 04/2021.  He has limited systemic options available, as he likely would not tolerate chemotherapy.      I discussed options again today with him in clinic and by phone with his son, including Pluvicto, Provenge, reduced dose docetaxel, continued ADT monotherapy, or supportive care.  They wish to restart ADT which was overdue, but nothing additional to that, and understand progression of the cancer.  The patient remains functional, able to conduct ADLs.    Lab Results   Component Value Date    PSA 88.52 (H) 10/08/2021    PSA 67.89 (H) 05/07/2021    PSA 43.03 (H) 01/29/2021    PSA 27.63 (H) 10/30/2020    PSA 20.65 (H) 07/31/2020    PSA 17.76 (H) 04/03/2020       He has had challenges with organizing medications at home.  He lives with his wife who works and is not able to help with this, and his son lives in [place] but is becoming more involved, with a daughter nearby.      He is followed by Palliative Care for back pain in his back at the area of prior surgery where he has a rod.  He has been on Oxycodone.  He has few treatment options, and he understands the goals of care are supportive and palliative.  He has done advanced care planning with Palliative Care.    Plan:  - Continue ADT; Eligard every 63-months (10/08/21).  - Follow PSA.  - Blood pressure control as needed.  - Followed by Palliative Care for pain control, thank you.  - Prior increased frequency of urination:  Better since stopping tamsulsoin. PVR has been low.  - LEE, improved with diuretic with his PCP -- he will follow up with PCP about this.  - Follow PSA.  - LEE: Diuretic as needed with his PCP and Palliative Care.  - Code status: In the past he has worked on a living will with his son and has discussed with with Palliative Care.  My understanding is that he still wishes to be full code at this time.    I spend 41 minutes reviewing data, coordinating care, documenting, and meeting with the patient on the day of service.    Follow up with me for next Eligard and as needed.    ---------------------------------------------------------    HPI:  - Prior patient of Dr. Verl Bartlett  - 2013: Presented with metastatic prostate cancer to bone, PSA ~900, started Lupron, then Avodart/Casodex, then abiraterone/Lupron  - 1/19: Experienced cord compression while on Lupron/abiraterone --> RT  - Neuroendocrine tumor grade 1 pancreatic mass will be observed.  - 08/11/17: Bone scan: c/w 5/18, stable metastatic disease in the thoracic spine and right hemipelvis.  Redemonstration of radiotracer uptake in the upper thoracic vertebral body. Slightly decreased uptake within the right hemipelvis, consistent with previously seen mixed sclerotic/lytic lesions. No new areas of radiotracer uptake. Physiologic uptake is seen in the kidneys and bladder.  - 09/08/17: CT A/P: 1. Interval improvement in previously visualized pancreatic tail stranding and free fluid with lobulated intermediate density peripancreatic collection along the pancreatic  tail, closely abutting the anterior aspect of the spleen. Findings may represent complex pseudocyst in the setting of prior pancreatitis. MRI MRCP is recommended for further evaluation as known neuroendocrine pancreatic tumor is not well delineated on this portal venous phase CT and to exclude postcontrast enhancement of the suspected complex pancreatic pseudocyst along the tail.  2. Right lower lobe bronchiectasis with mucous plugging.  3. Hepatic hypodensities, unchanged and favored to represent hepatic cysts. This may be also be confirmed by MRI.  2/19: Discontinued abiraterone  6-11/19: Radium-223  11/02/18: CT A/P: No new lytic or blastic osseous lesions. Similar appearance of mixed sclerotic and lytic lesion in the right hemipelvis.  11/02/18: Bone Scan: Slightly decrease in radiotracer uptake involving the thoracic spine and right hemipelvis when compared to prior imaging.  05/31/19: PSA 60.4  06/25/19: CT A/P: Similar appearance of mixed sclerotic and lytic lesions in the right hemipelvis. No new lytic or blastic lesions.  Similar appearance of hyperenhancing pancreatic body mass, compatible with pancreatic neuroendocrine tumor.  Decreased size of fluid collection adjacent to the pancreatic tail/inferior margin of the spleen.  Bronchiectasis with mucous plugging in the right lower lobe, similar to prior.  06/25/19: Similar focal increase in radiotracer uptake within the right hemipelvis when compared to prior bone scan from 11/02/2018. Stable focal uptake in the upper thoracic vertebral bodies.    ROS: Feeling well today. Pain is stable, no new pain. Urinary frequency better since stopping tamsulosin.    Current Outpatient Medications on File Prior to Visit   Medication Sig Dispense Refill   ??? allopurinoL (ZYLOPRIM) 100 MG tablet Take 1 tablet (100 mg total) by mouth daily. 90 tablet 3   ??? amLODIPine (NORVASC) 5 MG tablet Take 1 tablet by mouth daily.     ??? atorvastatin (LIPITOR) 10 MG tablet Take 10 mg by mouth nightly.      ??? carvediloL (COREG) 6.25 MG tablet Take 1 tablet (6.25 mg total) by mouth Two (2) times a day. 60 tablet 11   ??? ferrous sulfate 325 (65 FE) MG tablet Take 325 mg by mouth daily.     ??? melatonin 3 mg Tab 3 mg daily as needed.     ??? polyethylene glycol (MIRALAX) 17 gram packet Take 17 g by mouth two (2) times a day as needed.       Current Facility-Administered Medications on File Prior to Visit   Medication Dose Route Frequency Provider Last Rate Last Admin   ??? leuprolide (6 month) (ELIGARD) 45 mg injection                PE:  BP 134/75  - Pulse 75  - Temp 36.7 ??C (98.1 ??F) (Temporal)  - Resp 18  - Ht 167.6 cm (5' 6)  - Wt 56.4 kg (124 lb 4.8 oz)  - SpO2 98%  - BMI 20.06 kg/m??   NAD  A+Ox3  No rash  Abd ND    Data:    PSA   Date Value Ref Range Status   10/08/2021 88.52 (H) 0.00 - 4.00 ng/mL Final   05/07/2021 67.89 (H) 0.00 - 4.00 ng/mL Final   01/29/2021 43.03 (H) 0.00 - 4.00 ng/mL Final   10/30/2020 27.63 (H) 0.00 - 4.00 ng/mL Final   07/31/2020 20.65 (H) 0.00 - 4.00 ng/mL Final   04/03/2020 17.76 (H) 0.00 - 4.00 ng/mL Final   11/22/2019 34.50 (H) 0.00 - 4.00 ng/mL Final   09/13/2019 45.90 (H) 0.00 - 4.00 ng/mL Final  06/28/2019 51.50 (H) 0.00 - 4.00 ng/mL Final   05/31/2019 60.40 (H) 0.00 - 4.00 ng/mL Final   08/31/2018 31.30 (H) 0.00 - 4.00 ng/mL Final   07/06/2018 35.00 (H) 0.00 - 4.00 ng/mL Final   06/01/2018 33.10 (H) 0.00 - 4.00 ng/mL Final   05/04/2018 42.00 (H) 0.00 - 4.00 ng/mL Final   03/30/2018 42.70 (H) 0.00 - 4.00 ng/mL Final   01/26/2018 46.20 (H) 0.00 - 4.00 ng/mL Final   08/25/2017 61.50 (H) 0.00 - 4.00 ng/mL Final   08/08/2017 80.90 (H) 0.00 - 4.00 ng/mL Final   05/19/2017 34.70 (H) 0.00 - 4.00 ng/mL Final   02/10/2017 30.50 (H) 0.00 - 4.00 ng/mL Final   01/20/2017 19.60 (H) 0.00 - 4.00 ng/mL Final   01/07/2017 18.70 (H) 0.00 - 4.00 ng/mL Final   12/22/2016 19.40 (H) 0.00 - 4.00 ng/mL Final   12/03/2016 31.70 (H) 0.00 - 4.00 ng/mL Final   11/04/2016 30.30 (H) 0.00 - 4.00 ng/mL Final   08/06/2016 18.80 (H) 0.00 - 4.00 ng/mL Final   05/06/2016 24.10 (H) 0.00 - 4.00 ng/mL Final   01/29/2016 12.50 (H) 0.00 - 4.00 ng/mL Final   10/30/2015 11.10 (H) 0.00 - 4.00 ng/mL Final   07/24/2015 7.41 (H) 0.00 - 4.00 ng/mL Final   04/03/2015 3.44 0.00 - 4.00 ng/mL Final   12/23/2014 12.50 (H) 0.00 - 4.00 ng/mL Final

## 2021-10-08 NOTE — Unmapped (Signed)
Pt states running low on Oxycodone. DR Genice Rouge made aware and has sent in for refill. Patient made aware. Pt also requests flu vaccine while here. Has subsequently been ordered.

## 2021-10-08 NOTE — Unmapped (Signed)
Phlebotomy drawn by Virginia Crews RN. 23g RAC.  Labs drawn and sent for analysis.

## 2021-10-08 NOTE — Unmapped (Signed)
Flu vaccine given in right deltoid per Physician and patient request. Patient tolerated well. Band aid applied.  Patient received Eligard 45mg   SQ in Injection site: right lower abdomen. Band aid and guaze applied. Patient tolerated injection without complications and ambulated to checkout without any assistance

## 2021-10-21 ENCOUNTER — Ambulatory Visit
Admit: 2021-10-21 | Payer: MEDICARE | Attending: Student in an Organized Health Care Education/Training Program | Primary: Student in an Organized Health Care Education/Training Program

## 2021-10-24 NOTE — Unmapped (Signed)
Was unable to connect with patient.

## 2021-11-18 DIAGNOSIS — G893 Neoplasm related pain (acute) (chronic): Principal | ICD-10-CM

## 2021-11-18 DIAGNOSIS — C7951 Secondary malignant neoplasm of bone: Principal | ICD-10-CM

## 2021-11-18 DIAGNOSIS — C61 Malignant neoplasm of prostate: Principal | ICD-10-CM

## 2021-11-18 MED ORDER — OXYCODONE 5 MG TABLET
ORAL_TABLET | Freq: Three times a day (TID) | ORAL | 0 refills | 30 days | PRN
Start: 2021-11-18 — End: ?

## 2021-11-18 NOTE — Unmapped (Signed)
Patient called for refill of 5mg  roxicodone at [facility] in [place], [place].

## 2021-11-18 NOTE — Unmapped (Signed)
Specialty Medication(s): darolutamide    Stephen Bartlett has been dis-enrolled from the [facility] specialty pharmacy services due to medication discontinued .    Additional information provided to the patient: NA    Demaris Bousquet A Shari [facility] Specialty Pharmacist

## 2021-11-18 NOTE — Unmapped (Signed)
Hi,    Patient Stephen Bartlett called requesting a medication refill for the following:    ??? Medication: Oxycodone  ??? Dosage: 5 mg  ??? Days left of medication: 0  ??? Pharmacy: [facility]      The expected turnaround time is 3-4 business days       Check Indicates criteria has been reviewed and confirmed with the patient:    [x]  Preferred Name   [x]  DOB and/or MR#  [x]  Preferred Contact Method  [x]  Phone Number(s)   [x]  Preferred Pharmacy   []  MyChart     Thank you,  Christell Faith  [facility]  [phone]

## 2021-11-20 MED ORDER — OXYCODONE 5 MG TABLET
ORAL_TABLET | Freq: Three times a day (TID) | ORAL | 0 refills | 30 days | Status: CP | PRN
Start: 2021-11-20 — End: ?

## 2021-12-15 ENCOUNTER — Other Ambulatory Visit: Payer: Medicare HMO

## 2021-12-15 DIAGNOSIS — Z515 Encounter for palliative care: Secondary | ICD-10-CM

## 2021-12-15 NOTE — Progress Notes (Signed)
PATIENT NAME: [name] [name] ?DOB: 01/21/1941 ?MRN: [id number] ? ?PRIMARY CARE PROVIDER: Patient, No Pcp Per (Inactive) ? ?RESPONSIBLE PARTY:  ?Acct ID - Guarantor Home Phone Work Phone Relationship Acct Type  ?[id number] [name], [name]  Self P/F  ?   [address] [place], [place] [address]  ?I connected with  [name] [name] on 12/15/21 by telephone and verified that I am speaking with the correct person using two identifiers. ?  ?I discussed the limitations of evaluation and management by telemedicine. The patient expressed understanding and agreed to proceed.  ? ?PLAN OF CARE and INTERVENTIONS: ?              1.  GOALS OF CARE/ ADVANCE CARE PLANNING:  Remain home and independent with the assistance of family.  ?              2.  PATIENT/CAREGIVER EDUCATION:  Safety ?              4. PERSONAL EMERGENCY PLAN:  Activate 911 for emergencies.  ?              5.  DISEASE STATUS:  Connected with patient by phone as video is unavailable.  ? ?Debility:  No recent falls.  Patient advised he is no longer using a cane.  He continues to go outside and work in his yard for exercise.  He continues to drive and enjoys going to [place] for a change in scenery.  Patient advises he does not like to sit at home but tries to stay as active as his body will allow.  Re-enforced safety.  ? ?Edema:  No longer reported swelling to lower extremities.  Was previously on a diruetic but this has stopped since edema has improved.  ? ?Medication Management:  Patient endorses calling providers when refills are needed.  He also advises his son [name] [name] assists him with medication refills.  No refills are needed at this time. ? ?Pain:  Patient endorses occasional back pain.  Attributes this to a surgical rod that was placed in the past.  He continues to take oxycodone as needed and endorses this is effective in managing his pain.   ? ?Prostate Cancer:  Patient states he has started Eligard injections every 6 months.  Most recent injection was 2  months ago.  Patient denies any side effect related to this injection.  ? ? ? ?HISTORY OF PRESENT ILLNESS:  [name] [name] is a 81 y.o. 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  Neuroendocrine tumor grade 1 pancreatic mass. Restaging scans 06/2019 and 10/2018 have been stable post-Radium. Stable scans, PSA has been rising, from 31 in 08/2018 to 60 in 05/2019, Prostatitis, hypertension, hyperlipidemia, history of back surgery. Patient is being followed by Palliative Care every 8-12 weeks and PRN.  ? ?CODE STATUS: Full ?ADVANCED DIRECTIVES: No ?MOST FORM: No ?PPS: 50% ? ? ? ? ? ? ? ? ?[name] Burton, RN ? ?

## 2022-01-14 DIAGNOSIS — G893 Neoplasm related pain (acute) (chronic): Principal | ICD-10-CM

## 2022-01-14 DIAGNOSIS — C7951 Secondary malignant neoplasm of bone: Principal | ICD-10-CM

## 2022-01-14 DIAGNOSIS — C61 Malignant neoplasm of prostate: Principal | ICD-10-CM

## 2022-01-14 MED ORDER — OXYCODONE 5 MG TABLET
ORAL_TABLET | Freq: Three times a day (TID) | ORAL | 0 refills | 30 days | Status: CP | PRN
Start: 2022-01-14 — End: ?

## 2022-01-23 ENCOUNTER — Ambulatory Visit: Admit: 2022-01-23 | Discharge: 2022-01-23 | Disposition: A | Discharge status: Home / Self Care | Payer: MEDICARE

## 2022-01-23 ENCOUNTER — Emergency Department: Admit: 2022-01-23 | Discharge: 2022-01-23 | Disposition: A | Discharge status: Home / Self Care | Payer: MEDICARE

## 2022-01-23 DIAGNOSIS — N3000 Acute cystitis without hematuria: Principal | ICD-10-CM

## 2022-01-23 DIAGNOSIS — R1110 Vomiting, unspecified: Principal | ICD-10-CM

## 2022-01-23 DIAGNOSIS — R197 Diarrhea, unspecified: Principal | ICD-10-CM

## 2022-01-23 DIAGNOSIS — N200 Calculus of kidney: Principal | ICD-10-CM

## 2022-01-23 MED ORDER — CEPHALEXIN 500 MG CAPSULE
ORAL_CAPSULE | Freq: Two times a day (BID) | ORAL | 0 refills | 7.00000 days | Status: CP
Start: 2022-01-23 — End: 2022-01-30

## 2022-01-25 MED ORDER — SULFAMETHOXAZOLE 800 MG-TRIMETHOPRIM 160 MG TABLET
ORAL_TABLET | Freq: Two times a day (BID) | ORAL | 0 refills | 10 days | Status: CP
Start: 2022-01-25 — End: 2022-02-04

## 2022-02-11 ENCOUNTER — Ambulatory Visit: Admit: 2022-02-11 | Discharge: 2022-02-12 | Payer: MEDICARE

## 2022-02-12 MED ORDER — TAMSULOSIN 0.4 MG CAPSULE
ORAL_CAPSULE | Freq: Every evening | ORAL | 0 refills | 31.00000 days | Status: CN
Start: 2022-02-12 — End: 2022-03-15

## 2022-02-12 MED ORDER — AMOXICILLIN 500 MG CAPSULE
ORAL_CAPSULE | Freq: Three times a day (TID) | ORAL | 0 refills | 14 days | Status: CP
Start: 2022-02-12 — End: 2022-02-26

## 2022-02-24 ENCOUNTER — Other Ambulatory Visit: Payer: Medicare HMO

## 2022-02-24 DIAGNOSIS — Z515 Encounter for palliative care: Secondary | ICD-10-CM

## 2022-02-24 NOTE — Progress Notes (Signed)
PATIENT NAME: [name] [name] DOB: [birth year] MRN: [id number]  PRIMARY CARE PROVIDER: Patient, No Pcp Per  RESPONSIBLE PARTY:  Acct ID - Guarantor Home Phone Work Phone Relationship Acct Type  [id number] [name] [name], [phone]  Self P/F     [address] [place], [place] [address]    Connected with patient by telephone.  Patient advised of 2 ED visits last month due to "kidney stones".  Patient advised he will have a procedure next month on 03/24/22 for removal of stones.  Currently on antibiotics.  No further issues with hematuria.  Patient endorses eat well with 3 meals daily.  Most recent weight from the ED is 108 lbs on 02/11/22.  16 lb weight loss noted in close to 5 months.   Patient denies any issues with pain and continues with oxycodone up to 3 x daily.  Patient agreeable to a home visit with Palliative Care NP.  Visit scheduled for 04/08/22 @ 945 am.  Vance Gather, RN.

## 2022-03-02 ENCOUNTER — Ambulatory Visit: Admit: 2022-03-02 | Discharge: 2022-03-03 | Payer: MEDICARE

## 2022-03-02 DIAGNOSIS — N200 Calculus of kidney: Principal | ICD-10-CM

## 2022-03-02 DIAGNOSIS — N201 Calculus of ureter: Principal | ICD-10-CM

## 2022-03-02 MED ORDER — TAMSULOSIN 0.4 MG CAPSULE
ORAL_CAPSULE | Freq: Every evening | ORAL | 3 refills | 30 days | Status: CP
Start: 2022-03-02 — End: ?

## 2022-03-05 DIAGNOSIS — E559 Vitamin D deficiency, unspecified: Principal | ICD-10-CM

## 2022-03-05 MED ORDER — CHOLECALCIFEROL (VITAMIN D3) 10 MCG (400 UNIT) TABLET
ORAL_TABLET | Freq: Every day | ORAL | 1 refills | 30 days | Status: CP
Start: 2022-03-05 — End: 2023-03-05

## 2022-03-19 DIAGNOSIS — G893 Neoplasm related pain (acute) (chronic): Principal | ICD-10-CM

## 2022-03-19 DIAGNOSIS — C7951 Secondary malignant neoplasm of bone: Principal | ICD-10-CM

## 2022-03-19 DIAGNOSIS — C61 Malignant neoplasm of prostate: Principal | ICD-10-CM

## 2022-03-19 MED ORDER — OXYCODONE 5 MG TABLET
ORAL_TABLET | Freq: Three times a day (TID) | ORAL | 0 refills | 30 days | Status: CP | PRN
Start: 2022-03-19 — End: ?

## 2022-04-08 ENCOUNTER — Other Ambulatory Visit: Payer: Medicare HMO | Admitting: Nurse Practitioner

## 2022-04-08 ENCOUNTER — Encounter: Payer: Self-pay | Admitting: Nurse Practitioner

## 2022-04-08 DIAGNOSIS — R5381 Other malaise: Secondary | ICD-10-CM

## 2022-04-08 DIAGNOSIS — C61 Malignant neoplasm of prostate: Secondary | ICD-10-CM

## 2022-04-08 DIAGNOSIS — Z515 Encounter for palliative care: Secondary | ICD-10-CM

## 2022-04-08 NOTE — Progress Notes (Signed)
[place] Consult Note Telephone: [phone]  Fax: [phone]    Date of encounter: 04/08/22 12:54 PM PATIENT NAME: [name] [name] [id number]   [phone] (home)  DOB: [birth year] MRN: [id number] PRIMARY CARE PROVIDER:    [facility] palliative [name]  RESPONSIBLE PARTY:    Contact Information     Name Relation Home Work Mobile   [name] [name] Spouse  [phone]    [name] [name], [name] [name]   [phone]           Due to the COVID-19 crisis, this visit was done via telemedicine from my office and it was initiated and consent by this patient and or family.   I connected with  [name] [name] OR PROXY on 09/10/21 by a telephone as video not available enabled telemedicine application and verified that I am speaking with the correct person using two identifiers.   I discussed the limitations of evaluation and management by telemedicine. The patient expressed understanding and agreed to proceed. Palliative Care was asked to follow this patient by consultation request of  No ref. provider found to address advance care planning and complex medical decision making. This is a follow up visit.                                  ASSESSMENT AND PLAN / RECOMMENDATIONS: Symptom Management/Plan: 1. ACP: Full code, ongoing discussions with [name] [name] Palliative/Dr Arlyss Gandy with treatment options for further planning as cancer continues to progress.    2. Debility secondary to upper back pain due to bone metastasis from prostate cancer encourage mobility as able. Continue on current pain regimen with oxycodone 5 mg every 8 hours as needed pain-averaging 2-3 tabs/day through [place] [name]. [name] [name] endorses pain is at a comfortable level. [name] [name] continues to drive.    3. Palliative care encounter; Palliative care encounter; Palliative medicine team will continue to support patient, patient's family, and medical team. Visit consisted  of counseling and education dealing with the complex and emotionally intense issues of symptom management and palliative care in the setting of serious and potentially life-threatening illness   Follow up Palliative Care Visit: Palliative care will continue to follow for complex medical decision making, advance care planning, and clarification of goals. Return 8 weeks or prn.   I spent 33 minutes providing this consultation. More than 50% of the time in this consultation was spent in counseling and care coordination.   PPS: 50%   Chief Complaint: Follow up palliative consult for complex medical decision making   HISTORY OF PRESENT ILLNESS:  [name] is a 81 y.o. 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  Neuroendocrine tumor grade 1 pancreatic mass. Restaging scans 06/2019 and 10/2018 have been stable post-Radium. Stable scans, PSA has been rising, from 31 in 08/2018 to 60 in 05/2019, Prostatitis, hypertension, hyperlipidemia, history of back surgery. Recent difficulty with kidney stones, saw [facility] 03/02/2022 recommendation increase fluid intake, diet recommendations, limit sodium. I called Mr [name] to confirm in person follow up pallative care visit. [name] [name] requested to switch from in person to telemedicine telephonic as video not available. [name] [name] and I proceeded with visit, talked about purpose of pc visit. We talked about symptoms how he is doing well. [name] [name] endorses no further problems  with sob. We talked his appetite, nutrition and foods he likes, though continues to loose weight. We talked about ros, including pain which he continues to take Oxycodone '5mg'$  q 8 hrs managed through [name] [name]. [name] [name] endorses he is at desired pain level. We talked about no recent falls, continues to ambulate with a cane. We talked about no recent  hospitalizations, infections, wounds. We talked about his daily routine, quality of life, upcoming appointments with treatments. [name] [name] endorses they put off his treatment for a week due to his son's schedule, he brings him. We talked about his support [name] with is wife and son. Medical goals, Mr. Mr [name] endorses he is looking forward to his treatment. We talked about role PC in poc. Mr [name] talked at length about multiple topics, supportive role which extended visit. Discussed f/u pc visit, scheduled per [name] [name]. Therapeutic listening, emotional support provided. Will contact Mr Lynnette [name], Mr [name] son for update on visit, further discussion goc.   ROS 10 point [name] reviewed with Mr [name] all negative except HPI   Physical Exam: deferred Thank you for the opportunity to participate in the care of [name] [name].  The palliative care team will continue to follow. Please call our office at [phone] if we can be of additional assistance.   [name] Bartosiewicz Ihor Gully, NP

## 2022-04-22 ENCOUNTER — Ambulatory Visit: Admit: 2022-04-22 | Discharge: 2022-04-22 | Payer: MEDICARE | Attending: Medical Oncology | Primary: Medical Oncology

## 2022-04-22 ENCOUNTER — Ambulatory Visit: Admit: 2022-04-22 | Discharge: 2022-04-22 | Payer: MEDICARE

## 2022-04-22 DIAGNOSIS — C61 Malignant neoplasm of prostate: Principal | ICD-10-CM

## 2022-04-22 DIAGNOSIS — J479 Bronchiectasis, uncomplicated: Principal | ICD-10-CM

## 2022-04-22 DIAGNOSIS — G893 Neoplasm related pain (acute) (chronic): Principal | ICD-10-CM

## 2022-04-22 DIAGNOSIS — C7951 Secondary malignant neoplasm of bone: Principal | ICD-10-CM

## 2022-04-22 MED ORDER — OXYCODONE 5 MG TABLET
ORAL_TABLET | Freq: Three times a day (TID) | ORAL | 0 refills | 30 days | Status: CP | PRN
Start: 2022-04-22 — End: ?

## 2022-05-25 ENCOUNTER — Ambulatory Visit: Payer: Medicare HMO | Admitting: Nurse Practitioner

## 2022-05-25 ENCOUNTER — Encounter: Payer: Self-pay | Admitting: Nurse Practitioner

## 2022-05-25 DIAGNOSIS — C61 Malignant neoplasm of prostate: Secondary | ICD-10-CM

## 2022-05-25 DIAGNOSIS — Z515 Encounter for palliative care: Secondary | ICD-10-CM

## 2022-05-25 DIAGNOSIS — R5381 Other malaise: Secondary | ICD-10-CM

## 2022-05-25 NOTE — Progress Notes (Unsigned)
[place] Consult Note Telephone: [phone]  Fax: [phone]    Date of encounter: 05/25/22 9:19 PM PATIENT NAME: Rocklin Soderquist [place] [address]   [phone] (home)  DOB: 1941-03-17 MRN: [id number] PRIMARY CARE PROVIDER:    Patient, No Pcp Per,  No address on file None  REFERRING PROVIDER:   No referring provider defined for this encounter. N/A  RESPONSIBLE PARTY:    Contact Information     Name Relation Home Work [place] B Spouse  [phone]    Virl, Coble   [phone]       Due to the COVID-19 crisis, this visit was done via telemedicine from my office and it was initiated and consent by this patient and or family.   I connected with  [name] OR PROXY on 09/10/21 by a telephone as video not available enabled telemedicine application and verified that I am speaking with the correct person using two identifiers.   I discussed the limitations of evaluation and management by telemedicine. The patient expressed understanding and agreed to proceed. Palliative Care was asked to follow this patient by consultation request of  No ref. provider found to address advance care planning and complex medical decision making. This is a follow up visit.                                  ASSESSMENT AND PLAN / RECOMMENDATIONS: Symptom Management/Plan: 1. ACP: Full code, ongoing discussions with [facility] Palliative/Dr Arlyss Gandy with treatment options for further planning as cancer continues to progress.    2. Debility secondary to upper back pain due to bone metastasis from prostate cancer encourage mobility as able. Continue on current pain regimen with oxycodone 5 mg every 8 hours as needed pain-averaging 2-3 tabs/day through [place] clinic. Mr. Nham endorses pain is at a comfortable level. Mr. Sanzone continues to drive.    3. Palliative care encounter; Palliative care encounter; Palliative  medicine team will continue to support patient, patient's family, and medical team. Visit consisted of counseling and education dealing with the complex and emotionally intense issues of symptom management and palliative care in the setting of serious and potentially life-threatening illness   Follow up Palliative Care Visit: Palliative care will continue to follow for complex medical decision making, advance care planning, and clarification of goals. Return 8 weeks or prn.   I spent 31 minutes providing this consultation. More than 50% of the time in this consultation was spent in counseling and care coordination.   PPS: 50%   Chief Complaint: Follow up palliative consult for complex medical decision making   HISTORY OF PRESENT ILLNESS:  Pranshu Lyster is a 81 y.o. 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  Neuroendocrine tumor grade 1 pancreatic mass. Restaging scans 06/2019 and 10/2018 have been stable post-Radium. Stable scans, PSA has been rising, from 31 in 08/2018 to 60 in 05/2019, Prostatitis, hypertension, hyperlipidemia, history of back surgery. Recent difficulty with kidney stones, saw [facility] 03/02/2022 recommendation increase fluid intake, diet recommendations, limit sodium. I called Mr Featherly to confirm in person follow up pallative care visit. Mr. Brainerd requested to switch from in person to telemedicine telephonic as video not available.   Discussed f/u pc visit, scheduled per Mr. Krueger with [facility]  RN/PC SW as NP will sign off as program is completed. Therapeutic listening, emotional support provided. Will contact [name], Mr Ander son for update on visit, further discussion goc.  History obtained from review of EMR, discussion with Mr. Purdom.  I reviewed available labs, medications, imaging, studies and related documents from the EMR.  Records  reviewed and summarized above.   ROS 10 point system reviewed   Physical Exam: deferred Thank you for the opportunity to participate in the care of Mr. Spellman.  The palliative care team will continue to follow. Please call our office at [phone] if we can be of additional assistance.   Laurin Morgenstern Ihor Gully, NP

## 2022-06-11 DIAGNOSIS — C7951 Secondary malignant neoplasm of bone: Principal | ICD-10-CM

## 2022-06-11 DIAGNOSIS — C61 Malignant neoplasm of prostate: Principal | ICD-10-CM

## 2022-06-11 DIAGNOSIS — G893 Neoplasm related pain (acute) (chronic): Principal | ICD-10-CM

## 2022-06-11 MED ORDER — OXYCODONE 5 MG TABLET
ORAL_TABLET | Freq: Three times a day (TID) | ORAL | 0 refills | 30 days | PRN
Start: 2022-06-11 — End: ?

## 2022-06-17 MED ORDER — OXYCODONE 5 MG TABLET
ORAL_TABLET | Freq: Three times a day (TID) | ORAL | 0 refills | 30 days | Status: CP | PRN
Start: 2022-06-17 — End: ?

## 2022-06-29 ENCOUNTER — Encounter: Payer: Self-pay | Admitting: Nurse Practitioner

## 2022-06-29 ENCOUNTER — Telehealth: Payer: Medicare HMO | Admitting: Nurse Practitioner

## 2022-06-29 DIAGNOSIS — R5381 Other malaise: Secondary | ICD-10-CM

## 2022-06-29 DIAGNOSIS — Z515 Encounter for palliative care: Secondary | ICD-10-CM

## 2022-06-29 DIAGNOSIS — C61 Malignant neoplasm of prostate: Secondary | ICD-10-CM

## 2022-06-29 NOTE — Progress Notes (Signed)
[facility] Consult Note Telephone: [phone]  Fax: [phone]    Date of encounter: 06/29/22 3:52 PM PATIENT NAME: [name] [name] [place] [address]   [phone] (home)  DOB: [birth year] MRN: [id number] PRIMARY CARE PROVIDER:    PRIMARY CARE PROVIDER:    [facility] palliative clinic   RESPONSIBLE PARTY:    Contact Information       Name Relation Home Work Mobile    [place] B Spouse   [phone]      [name], [name]     [phone]              Due to the COVID-19 crisis, this visit was done via telemedicine from my office and it was initiated and consent by this patient and or family.   I connected with  [name] [name] OR PROXY on 09/10/21 by a telephone as video not available enabled telemedicine application and verified that I am speaking with the correct person using two identifiers.   I discussed the limitations of evaluation and management by telemedicine. The patient expressed understanding and agreed to proceed. Palliative Care was asked to follow this patient by consultation request of  No ref. provider found to address advance care planning and complex medical decision making. This is a follow up visit.                                  ASSESSMENT AND PLAN / RECOMMENDATIONS: Symptom Management/Plan: 1. ACP: ongoing discussions with [facility] with treatment options for further planning as cancer continues to progress.    2. Debility secondary to upper back pain due to bone metastasis from prostate cancer encourage mobility as able. Continue on current pain regimen with oxycodone 5 mg every 8 hours as needed pain-averaging 2-3 tabs/day through [facility]. [name]. [name] endorses pain is at a comfortable level. [name]. [name] continues to drive.    3. Palliative care encounter; Palliative care encounter; Palliative medicine team will continue to support patient, patient's family, and  medical team. Visit consisted of counseling and education dealing with the complex and emotionally intense issues of symptom management and palliative care in the setting of serious and potentially life-threatening illness  I spent 21 minutes providing this consultation. More than 50% of the time in this consultation was spent in counseling and care coordination.   PPS: 50%   Chief Complaint: Follow up palliative consult for complex medical decision making   HISTORY OF PRESENT ILLNESS:  [name] [name] is a 81 y.o. 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  Neuroendocrine tumor grade 1 pancreatic mass. Restaging scans 06/2019 and 10/2018 have been stable post-Radium. Stable scans, PSA has been rising, from 31 in 08/2018 to 60 in 05/2019, Prostatitis, hypertension, hyperlipidemia, history of back surgery. I called [name] [name] for  telemedicine telephonic as video not available. [name]. [name] and I We talked about role PC in poc. We talked about how he has been feeling today. [name]. [name] endorses every day is a good day. We talked about [name] [name] functional abilities, he continues to drive, independent. [name]. [name] endorses some intermit weakness with no recent falls. We talked about ros including medication which his son manages including pain. [name] [name] endorses he sleeps well. We talked about daily routine, quality of  life. We talked about appetite, foods he likes. We talked about role pc in poc with PC NP to sign off though PC RN/PC SW will continue to follow under PC program transition. No other questions or concerns, no recent falls. Therapeutic listening, emotional support provided. Will contact [name] [name], [name] [name] son for update on visit, further discussion goc.   ROS 10 point system reviewed with [name] [name] all negative except HPI   Physical Exam: deferred Thank you  for the opportunity to participate in the care of [name]. [name]. Please call our office at [phone] if we can be of additional assistance.   Myanna Ziesmer Ihor Gully, NP

## 2022-07-01 ENCOUNTER — Ambulatory Visit: Admit: 2022-07-01 | Payer: MEDICARE

## 2022-07-01 ENCOUNTER — Ambulatory Visit: Admit: 2022-07-01 | Payer: MEDICARE | Attending: Medical Oncology | Primary: Medical Oncology

## 2022-07-06 ENCOUNTER — Ambulatory Visit: Admit: 2022-07-06 | Discharge: 2022-07-06 | Payer: MEDICARE

## 2022-07-06 DIAGNOSIS — N200 Calculus of kidney: Principal | ICD-10-CM

## 2022-07-23 ENCOUNTER — Telehealth: Payer: Self-pay

## 2022-07-23 NOTE — Telephone Encounter (Signed)
11 am. Phone call made to patient to schedule a home visit.  Patient agreeable to next Wednesday at 1 pm.

## 2022-07-28 ENCOUNTER — Other Ambulatory Visit: Payer: Medicare HMO

## 2022-07-28 VITALS — BP 128/72 | HR 79 | Temp 97.5°F

## 2022-07-28 DIAGNOSIS — Z515 Encounter for palliative care: Secondary | ICD-10-CM

## 2022-07-28 NOTE — Progress Notes (Signed)
PATIENT NAME: [name] [name] DOB: 20-Aug-1941 MRN: [id number]  PRIMARY CARE PROVIDER: Dr. Julian Reil patient  RESPONSIBLE PARTY:  Acct ID - Guarantor Home Phone Work Phone Relationship Acct Type  [id number] QUINTARIUS, [phone]  Self P/F     [address] [place], [place] [address]   ACP:  Reviewed with patient.  At this time he desires a full code status and full medical interventions.  He is aware his prostate cancer is not curable but states he would want full interventions to sustain his life.  Will continue to discuss and provide education.   Functional Status:  Patient is asking for help in the home.  Upon further discussion, patient states he does all ADL's himself and cooks most of his meals. He continues to drive without difficulty.  Patient is not using assistive devices in the home such as a cane/walker and no falls are reported. Patient is not interested in [place].  More concerned about home situation with wife.  I encouraged him to discuss concerns with his son who assists him.  Patient advises son is also medical and financial POA.   Patient denies unsafe home environment but is not happy with current situation.  Pain:  Patient endorses back pain.  He is taking oxycodone prn and advises he does not require this on a daily basis.  Feels pain is well managed at this time.   Prostate Cancer:  Continues with Eligard injections at [facility] every 6 months.   Patient's son transports him to appointments.  Patient denies any new issues.   Weight Loss:  Most recent weight on 07/06/22 of 106 lbs.  04/22/22 weight 112 lbs and 02/11/22 weight 108 lbs.  Patient aware his weight loss is likely due to cancer.  He is eating 2-3 meals daily.   CODE STATUS: Full ADVANCED DIRECTIVES: Yes-not on file MOST FORM: No PPS: 60%   PHYSICAL EXAM:   VITALS: Today's Vitals   07/28/22 1310  BP: 128/72  Pulse: 79  Temp: (!) 97.5 F (36.4 C)  SpO2: 99%    LUNGS: clear to auscultation  CARDIAC: Cor RRR}   EXTREMITIES: - for edema SKIN: Skin color, texture, turgor normal. No rashes or lesions or normal  NEURO: negative       Lorenza Burton, RN

## 2022-08-05 ENCOUNTER — Ambulatory Visit: Admit: 2022-08-05 | Discharge: 2022-08-05 | Payer: MEDICARE | Attending: Medical Oncology | Primary: Medical Oncology

## 2022-08-05 ENCOUNTER — Other Ambulatory Visit: Admit: 2022-08-05 | Discharge: 2022-08-05 | Payer: MEDICARE

## 2022-08-05 DIAGNOSIS — C61 Malignant neoplasm of prostate: Principal | ICD-10-CM

## 2022-08-05 DIAGNOSIS — C7951 Secondary malignant neoplasm of bone: Principal | ICD-10-CM

## 2022-08-05 DIAGNOSIS — G893 Neoplasm related pain (acute) (chronic): Principal | ICD-10-CM

## 2022-08-05 MED ORDER — OXYCODONE 5 MG TABLET
ORAL_TABLET | Freq: Three times a day (TID) | ORAL | 0 refills | 30 days | Status: CP | PRN
Start: 2022-08-05 — End: ?

## 2022-08-25 ENCOUNTER — Other Ambulatory Visit: Payer: Medicare HMO

## 2022-08-25 DIAGNOSIS — Z515 Encounter for palliative care: Secondary | ICD-10-CM

## 2022-08-25 NOTE — Progress Notes (Signed)
PATIENT NAME: Raedyn Wenke DOB: [birth year] MRN: [id number]  PRIMARY CARE PROVIDER: Patient, No Pcp Per  RESPONSIBLE PARTY:  Acct ID - Guarantor Home Phone Work Phone Relationship Acct Type  [id number] WILLI, [phone]  Self P/F     [address], [place], [place] [address]   Arrived at patient's home for scheduled visit.  Patient answered the door and advised he did not realize today was his visit.  He is not ready to have a home visit and asks for a call to reschedule.   Attempted to complete a telephonic visit with patient this afternoon but he was driving and requested a call back at a later time.      Lorenza Burton, RN

## 2022-09-03 ENCOUNTER — Other Ambulatory Visit: Payer: Medicare HMO

## 2022-09-03 DIAGNOSIS — Z515 Encounter for palliative care: Secondary | ICD-10-CM

## 2022-09-03 NOTE — Progress Notes (Signed)
PATIENT NAME: [name] [name] DOB: September 30, 1940 MRN: [id number]  PRIMARY CARE PROVIDER: Patient, No Pcp Per  RESPONSIBLE PARTY:  Acct ID - Guarantor Home Phone Work Phone Relationship Acct Type  [id number] CAL, [phone]  Self P/F     [address], [place], [place] [address]   I connected with  [name] on 09/03/22 by telephone and verified that I am speaking with the correct person using two identifiers.   I discussed the limitations of evaluation and management by telemedicine. The patient expressed understanding and agreed to proceed.   Connected with patient by phone and he requested a joint call with his son [name].  Attempted to connect with son but no answer.  Message has been left.  Patient advised he is doing well over all.  He continues to cook meals for himself and drives independently.  Patient has ongoing back pain and is taking roxicodone for pain management.  He states this is effective.  Patient will need a refill on this soon.  He states he will let his son know as he manages medications.  Advised if son calls back, I will also let him know about the refill need.  Patient denies any other needs at this time and states he is feeling well with no symptom management needs.  Patient requests call next month before scheduling a home visit.    Lorenza Burton, RN

## 2022-09-13 DIAGNOSIS — C7951 Secondary malignant neoplasm of bone: Principal | ICD-10-CM

## 2022-09-13 DIAGNOSIS — G893 Neoplasm related pain (acute) (chronic): Principal | ICD-10-CM

## 2022-09-13 DIAGNOSIS — C61 Malignant neoplasm of prostate: Principal | ICD-10-CM

## 2022-09-13 MED ORDER — OXYCODONE 5 MG TABLET
ORAL_TABLET | Freq: Three times a day (TID) | ORAL | 0 refills | 30 days | Status: CP | PRN
Start: 2022-09-13 — End: ?

## 2022-10-12 ENCOUNTER — Other Ambulatory Visit: Payer: Medicare HMO

## 2022-10-12 DIAGNOSIS — Z515 Encounter for palliative care: Secondary | ICD-10-CM

## 2022-10-12 NOTE — Progress Notes (Signed)
COMMUNITY PALLIATIVE CARE SW NOTE  PATIENT NAME: Edman Kava DOB: [birth year] MRN: [id number]  PRIMARY CARE PROVIDER: Patient, No Pcp Per  RESPONSIBLE PARTY:  Acct ID - Guarantor Home Phone Work Phone Relationship Acct Type  [id number] ALFREDDIE, [phone]  Self P/F     [address], [place], [place] [address]                                          [place] [place] care outreach to patient to complete palliative care follow up and to schedule in person visit. Patient share that he has been doing well since previous PC contact.  Social work intervention: Provided brief grief support, as patient shared that he loss his brother 2 weeks ago that he was close to. Patient share that he has family support and talks with his nephew often. Patient has 4 other living siblings.   Minimal pain: takes pain meds as needed. No medication needs. Son manages medications.  Appetite: good and able to make his own meals. Mobility: still driving. No falls reported  Plan/follow up: Patient denies in person visits at this time. Open to monthly TC check ins. PC to follow up in 3-4 weeks.        SOCIAL HX:  Social History   Tobacco Use   Smoking status: Never   Smokeless tobacco: Never  Substance Use Topics   Alcohol use: Yes    CODE STATUS: FULL CODE ADVANCED DIRECTIVES: N MOST FORM COMPLETE: N HOSPICE EDUCATION PROVIDED: N  PPS: Patient is independent with ADL's.   Time spent: 20 min       [place], Clifford

## 2022-11-03 DIAGNOSIS — C61 Malignant neoplasm of prostate: Principal | ICD-10-CM

## 2022-11-04 ENCOUNTER — Ambulatory Visit: Admit: 2022-11-04 | Discharge: 2022-11-05 | Payer: MEDICARE | Attending: Medical Oncology | Primary: Medical Oncology

## 2022-11-04 ENCOUNTER — Other Ambulatory Visit: Admit: 2022-11-04 | Discharge: 2022-11-05 | Payer: MEDICARE

## 2022-11-04 DIAGNOSIS — C7951 Secondary malignant neoplasm of bone: Principal | ICD-10-CM

## 2022-11-04 DIAGNOSIS — J479 Bronchiectasis, uncomplicated: Principal | ICD-10-CM

## 2022-11-04 DIAGNOSIS — C61 Malignant neoplasm of prostate: Principal | ICD-10-CM

## 2022-11-10 DIAGNOSIS — C7951 Secondary malignant neoplasm of bone: Principal | ICD-10-CM

## 2022-11-10 DIAGNOSIS — G893 Neoplasm related pain (acute) (chronic): Principal | ICD-10-CM

## 2022-11-10 DIAGNOSIS — C61 Malignant neoplasm of prostate: Principal | ICD-10-CM

## 2022-11-10 MED ORDER — OXYCODONE 5 MG TABLET
ORAL_TABLET | Freq: Three times a day (TID) | ORAL | 0 refills | 30 days | PRN
Start: 2022-11-10 — End: ?

## 2022-11-11 DIAGNOSIS — C61 Malignant neoplasm of prostate: Principal | ICD-10-CM

## 2022-11-11 DIAGNOSIS — C7951 Secondary malignant neoplasm of bone: Principal | ICD-10-CM

## 2022-11-11 DIAGNOSIS — G893 Neoplasm related pain (acute) (chronic): Principal | ICD-10-CM

## 2022-11-11 MED ORDER — OXYCODONE 5 MG TABLET
ORAL_TABLET | Freq: Three times a day (TID) | ORAL | 0 refills | 30.00000 days | Status: CP | PRN
Start: 2022-11-11 — End: ?

## 2022-12-31 ENCOUNTER — Telehealth: Payer: Self-pay

## 2022-12-31 NOTE — Telephone Encounter (Signed)
1754 Palliative Care Note  RN attempted to contact pt for follow up to volunteer call. No answer. Voicemail has not been set up. Will try again Monday.  Barbette Merino, RN

## 2022-12-31 NOTE — Telephone Encounter (Signed)
1752 Palliative Care Note  Volunteer called for palliative check in on 12/28/22. Spoke with [name] [name].  He is complaining that he has shortness of breath in the mornings and coughing up phlegm.  He states it is hard to catch his breath.  He stated that he thinks he needs to go back to the hospital.  I asked if he was having shortness of breath while we were talking and he said no.  He has a doctor appointment this month.  I made sure he had the [facility] number direct to Palliative Care.  He is open to future calls.    RN will contact pt.   Barbette Merino, RN

## 2023-01-14 ENCOUNTER — Other Ambulatory Visit: Payer: Medicare HMO

## 2023-01-14 DIAGNOSIS — Z515 Encounter for palliative care: Secondary | ICD-10-CM

## 2023-01-14 NOTE — Progress Notes (Signed)
COMMUNITY PALLIATIVE CARE SW NOTE  PATIENT NAME: [name] [name] DOB: 03/15/1941 MRN: [id number]  PRIMARY CARE PROVIDER: Patient, No Pcp Per  RESPONSIBLE PARTY:  Acct ID - Guarantor Home Phone Work Phone Relationship Acct Type  [id number] Alcus Dad* [phone]  Self P/F     [address], [place], [place] [address]     PLAN OF CARE and INTERVENTIONS:              Palliative care encounter: In person follow up home visit held with patient.    Patients son voiced concern of patients functional decline to [facility] cancer center. Today patient states that he is feeling okay.  Nutrition: son voiced concern around patients decline in appetite and inquired about an appetite stimulant. Patient affirms sons concerns in that he has not had much of an appetite over the past week, but states that today he has eaten well - 2 meals so far along with 1 ensure. Patient endorses fluid intake is okay.  Pain: Patient shares that he always has pain due to his pain. Patient takes oxycontin for pain and edorses he only has 2 left. son has sent message into provider to request a refill.  Mobility: patient endorses that he is weaker and slower now and requires assistance sometimes with bathing. States his wife assist with this as well as meal preparation. Patient shares that he can still drive if he wants to.   Hospice: Discussed briefly. SW broached the topic of hospice as a f/u from conversation with son. Patient states multiple times during visit that he knows his prognosis is not good. Brief education provided to patient about hospice and the different settings hospice can be provided to include his home. Patient states he would be open to hospice if needed in the future.   Plan: SW connected with patients son during visits to make aware of consensus of visit. SW outreached cheyrl [phone] with cancer center to make aware of visit and request appetite stimulant and make aware of sons request for to refill  patients pain medication.   PC to f/u in 2 week.s    SOCIAL HX:  Social History   Tobacco Use   Smoking status: Never   Smokeless tobacco: Never  Substance Use Topics   Alcohol use: Yes    CODE STATUS: FULL CODE ADVANCED DIRECTIVES: N MOST FORM COMPLETE:  N HOSPICE EDUCATION PROVIDED: Y  PPS:40%  Time spent: 30 min      [place], [place]

## 2023-01-15 MED ORDER — OXYCODONE 5 MG TABLET
ORAL_TABLET | Freq: Three times a day (TID) | ORAL | 0 refills | 30 days | Status: CP | PRN
Start: 2023-01-15 — End: ?

## 2023-01-25 ENCOUNTER — Other Ambulatory Visit: Payer: Medicare HMO

## 2023-01-25 DIAGNOSIS — Z515 Encounter for palliative care: Secondary | ICD-10-CM

## 2023-01-25 NOTE — Progress Notes (Signed)
COMMUNITY PALLIATIVE CARE SW NOTE  PATIENT NAME: Lyam Lamotte DOB: Jan 07, 1941 MRN: [id number]  PRIMARY CARE PROVIDER: Patient, No Pcp Per  RESPONSIBLE PARTY:  Acct ID - Guarantor Home Phone Work Phone Relationship Acct Type  [id number] Alcus Dad* [phone]  Self P/F     [address] [place], [place] [address]     PLAN OF CARE and INTERVENTIONS:              Palliative care encounter: in home follow up visit done with patient.   Patient received sitting in his chair. Patient states that feels "the same" as previous PC visit. Patient more calm and less engaged this visit.  Pain: 0/10. Patient states that he received his pain medication refills and are not in need of medications at this time,  Nutrition: patient shared that he had just finished eating and is still consuming the supplement drinks. Per spouse he is eating better.  ADLs: patient share that he is needs assistance with bathing. His sister came to assist with bathing yesterday. Patient now has shower chair. Patient hares that he spoke with a nurse who shared that they are working on getting assistance in the home twice a week to assist with showers. Patient share that he does feel weaker. Spouse share that patient has not drove in over 2 months and that she has noticed patient getting weaker.    Plan: PC to f/u in 3-4 weeks.  SOCIAL HX:  Social History   Tobacco Use   Smoking status: Never   Smokeless tobacco: Never  Substance Use Topics   Alcohol use: Yes          [place], [place]

## 2023-01-26 ENCOUNTER — Telehealth: Payer: Self-pay

## 2023-01-26 NOTE — Telephone Encounter (Signed)
1359 Palliative Care Note    Palliative care check in call made by volunteer yesterday, 01/25/23. Pt reports that he is "overall doing ok." His sister comes in and assists him with bathing once a week. Wishes [facility] could help him get someone who could come in and help him bathe twice a week. Reports that he has upcoming appts with kidney specialist and oncologist. Reports that oxycontin is helping with his pain and sleeping. Feels like he is not as strong when he gets up after resting on couch. Also reports having to urinate often. Volunteer ensured pt had call back information. Pt is open to future calls.  Barbette Merino, RN

## 2023-02-03 DIAGNOSIS — N3000 Acute cystitis without hematuria: Principal | ICD-10-CM

## 2023-02-03 DIAGNOSIS — C61 Malignant neoplasm of prostate: Principal | ICD-10-CM

## 2023-02-03 MED ORDER — CIPROFLOXACIN 500 MG TABLET
ORAL_TABLET | Freq: Two times a day (BID) | ORAL | 0 refills | 5 days | Status: CP
Start: 2023-02-03 — End: 2023-02-08

## 2023-02-11 ENCOUNTER — Other Ambulatory Visit: Payer: Medicare HMO

## 2023-02-11 ENCOUNTER — Telehealth: Payer: Self-pay

## 2023-02-11 DIAGNOSIS — Z515 Encounter for palliative care: Secondary | ICD-10-CM

## 2023-02-11 NOTE — Progress Notes (Signed)
PATIENT NAME: [name] [name] DOB: 03/24/1941 MRN: [id number]  PRIMARY CARE PROVIDER: Patient, No Pcp Per  RESPONSIBLE PARTY:  Acct ID - Guarantor Home Phone Work Phone Relationship Acct Type  [id number] ALCIDES, NUTTING* [phone]  Self P/F     [address] [place], [place] [address]   I connected with  [name] [name] on 02/11/23 by telephone and verified that I am speaking with the correct person using two identifiers.   I discussed the limitations of evaluation and management by telemedicine. The patient expressed understanding and agreed to proceed.    Connected with patient to follow up on overall status.  Chart review completed and patient was seen by [facility] but declined services as he wanted the autonomy of making decisions on his own regarding hospitalizations.  Patient confirms he is not under hospice at this time.  His son has hired private caregivers to assist with bathing.  Patient advised a caregiver just left from helping him with his bathing.  Appetite is very good and patient denies any current weight loss.  He enjoys eating ice cream often and prepares meals for himself.  Pain is well managed at this time.  Patient denies any new concerns and is agreeable to scheduled visit on 7/3.     Truitt Merle, RN

## 2023-02-11 NOTE — Telephone Encounter (Signed)
1040 am.  Return call made to [place], RN with [facility].  No answer.  Message left requesting a call back.

## 2023-02-18 DIAGNOSIS — G893 Neoplasm related pain (acute) (chronic): Principal | ICD-10-CM

## 2023-02-18 DIAGNOSIS — C7951 Secondary malignant neoplasm of bone: Principal | ICD-10-CM

## 2023-02-18 DIAGNOSIS — C61 Malignant neoplasm of prostate: Principal | ICD-10-CM

## 2023-02-18 MED ORDER — OXYCODONE 5 MG TABLET
ORAL_TABLET | Freq: Three times a day (TID) | ORAL | 0 refills | 30 days | PRN
Start: 2023-02-18 — End: ?

## 2023-02-19 DIAGNOSIS — C61 Malignant neoplasm of prostate: Principal | ICD-10-CM

## 2023-02-19 DIAGNOSIS — G893 Neoplasm related pain (acute) (chronic): Principal | ICD-10-CM

## 2023-02-19 DIAGNOSIS — C7951 Secondary malignant neoplasm of bone: Principal | ICD-10-CM

## 2023-02-19 MED ORDER — OXYCODONE 5 MG TABLET
ORAL_TABLET | Freq: Three times a day (TID) | ORAL | 0 refills | 30 days | Status: CP | PRN
Start: 2023-02-19 — End: ?

## 2023-02-20 DIAGNOSIS — C61 Malignant neoplasm of prostate: Principal | ICD-10-CM

## 2023-02-20 DIAGNOSIS — G893 Neoplasm related pain (acute) (chronic): Principal | ICD-10-CM

## 2023-02-20 DIAGNOSIS — C7951 Secondary malignant neoplasm of bone: Principal | ICD-10-CM

## 2023-02-20 MED ORDER — OXYCODONE 5 MG TABLET
ORAL_TABLET | Freq: Three times a day (TID) | ORAL | 0 refills | 30.00000 days | Status: CP | PRN
Start: 2023-02-20 — End: ?

## 2023-02-23 ENCOUNTER — Other Ambulatory Visit: Payer: Medicare HMO

## 2023-04-10 ENCOUNTER — Other Ambulatory Visit: Payer: Self-pay

## 2023-04-10 ENCOUNTER — Inpatient Hospital Stay
Admission: EM | Admit: 2023-04-10 | Discharge: 2023-04-24 | DRG: 951 | Disposition: E | Attending: Internal Medicine | Admitting: Internal Medicine

## 2023-04-10 DIAGNOSIS — E162 Hypoglycemia, unspecified: Secondary | ICD-10-CM | POA: Diagnosis present

## 2023-04-10 DIAGNOSIS — E785 Hyperlipidemia, unspecified: Secondary | ICD-10-CM | POA: Diagnosis present

## 2023-04-10 DIAGNOSIS — Z66 Do not resuscitate: Secondary | ICD-10-CM | POA: Diagnosis present

## 2023-04-10 DIAGNOSIS — Z923 Personal history of irradiation: Secondary | ICD-10-CM

## 2023-04-10 DIAGNOSIS — Z9221 Personal history of antineoplastic chemotherapy: Secondary | ICD-10-CM

## 2023-04-10 DIAGNOSIS — Z515 Encounter for palliative care: Principal | ICD-10-CM

## 2023-04-10 DIAGNOSIS — I10 Essential (primary) hypertension: Secondary | ICD-10-CM | POA: Diagnosis present

## 2023-04-10 DIAGNOSIS — E43 Unspecified severe protein-calorie malnutrition: Secondary | ICD-10-CM | POA: Diagnosis present

## 2023-04-10 DIAGNOSIS — D3A8 Other benign neuroendocrine tumors: Secondary | ICD-10-CM | POA: Diagnosis present

## 2023-04-10 DIAGNOSIS — E15 Nondiabetic hypoglycemic coma: Secondary | ICD-10-CM | POA: Diagnosis present

## 2023-04-10 DIAGNOSIS — C61 Malignant neoplasm of prostate: Secondary | ICD-10-CM | POA: Diagnosis present

## 2023-04-10 DIAGNOSIS — R54 Age-related physical debility: Secondary | ICD-10-CM | POA: Diagnosis present

## 2023-04-10 DIAGNOSIS — G9340 Encephalopathy, unspecified: Secondary | ICD-10-CM

## 2023-04-10 DIAGNOSIS — C7951 Secondary malignant neoplasm of bone: Secondary | ICD-10-CM | POA: Diagnosis present

## 2023-04-10 DIAGNOSIS — Z79899 Other long term (current) drug therapy: Secondary | ICD-10-CM

## 2023-04-10 DIAGNOSIS — Z681 Body mass index (BMI) 19 or less, adult: Secondary | ICD-10-CM | POA: Diagnosis not present

## 2023-04-10 DIAGNOSIS — R64 Cachexia: Secondary | ICD-10-CM | POA: Diagnosis present

## 2023-04-10 LAB — CBG MONITORING, ED
Glucose-Capillary: 157 mg/dL — ABNORMAL HIGH (ref 70–99)
Glucose-Capillary: <10 mg/dL — CL (ref 70–99)
Glucose-Capillary: 99 mg/dL (ref 70–99)
Glucose-Capillary: 70 mg/dL (ref 70–99)
Glucose-Capillary: 111 mg/dL — ABNORMAL HIGH (ref 70–99)
Glucose-Capillary: 18 mg/dL — CL (ref 70–99)
Glucose-Capillary: 111 mg/dL — ABNORMAL HIGH (ref 70–99)

## 2023-04-10 MED ORDER — ACETAMINOPHEN 650 MG RE SUPP: 650.0000 mg | Freq: Four times a day (QID) | RECTAL | Status: DC | PRN

## 2023-04-10 MED ORDER — LORAZEPAM 2 MG/ML PO CONC: 1.0000 mg | ORAL | Status: DC | PRN

## 2023-04-10 MED ORDER — GLYCOPYRROLATE 1 MG PO TABS: 1.0000 mg | ORAL_TABLET | ORAL | Status: DC | PRN

## 2023-04-10 MED ORDER — OXYCODONE HCL 5 MG PO TABS
5.0000 mg | ORAL_TABLET | ORAL | Status: DC | PRN
  Administered 2023-04-10 – 2023-04-12 (×2): 5 mg via ORAL
  Filled 2023-04-10 (×2): qty 1

## 2023-04-10 MED ORDER — ONDANSETRON HCL 4 MG/2ML IJ SOLN: 4.0000 mg | Freq: Four times a day (QID) | INTRAMUSCULAR | Status: DC | PRN

## 2023-04-10 MED ORDER — GLYCOPYRROLATE 0.2 MG/ML IJ SOLN: 0.2000 mg | INTRAMUSCULAR | Status: DC | PRN

## 2023-04-10 MED ORDER — DEXTROSE 50 % IV SOLN
2.0000 | Freq: Once | INTRAVENOUS | Status: AC
  Administered 2023-04-10: 100 mL via INTRAVENOUS

## 2023-04-10 MED ORDER — HALOPERIDOL LACTATE 2 MG/ML PO CONC: 0.5000 mg | ORAL | Status: DC | PRN

## 2023-04-10 MED ORDER — HALOPERIDOL LACTATE 5 MG/ML IJ SOLN: 0.5000 mg | INTRAMUSCULAR | Status: DC | PRN

## 2023-04-10 MED ORDER — LORAZEPAM 1 MG PO TABS
1.0000 mg | ORAL_TABLET | ORAL | Status: DC | PRN
  Administered 2023-04-13 – 2023-04-15 (×2): 1 mg via ORAL
  Filled 2023-04-10 (×2): qty 1

## 2023-04-10 MED ORDER — MORPHINE SULFATE (PF) 4 MG/ML IV SOLN
4.0000 mg | Freq: Once | INTRAVENOUS | Status: AC
  Administered 2023-04-10: 4 mg via INTRAVENOUS
  Filled 2023-04-10: qty 1

## 2023-04-10 MED ORDER — BIOTENE DRY MOUTH MT LIQD: 15.0000 mL | OROMUCOSAL | Status: DC | PRN

## 2023-04-10 MED ORDER — HALOPERIDOL 0.5 MG PO TABS: 0.5000 mg | ORAL_TABLET | ORAL | Status: DC | PRN

## 2023-04-10 MED ORDER — MORPHINE SULFATE (PF) 2 MG/ML IV SOLN
1.0000 mg | INTRAVENOUS | Status: DC | PRN
  Administered 2023-04-10 – 2023-04-16 (×16): 1 mg via INTRAVENOUS
  Filled 2023-04-10 (×16): qty 1

## 2023-04-10 MED ORDER — POLYVINYL ALCOHOL 1.4 % OP SOLN: 1.0000 [drp] | Freq: Four times a day (QID) | OPHTHALMIC | Status: DC | PRN

## 2023-04-10 MED ORDER — DEXTROSE 50 % IV SOLN
25.0000 g | INTRAVENOUS | Status: DC | PRN
  Administered 2023-04-11 – 2023-04-16 (×4): 25 g via INTRAVENOUS
  Filled 2023-04-10 (×4): qty 50

## 2023-04-10 MED ORDER — DEXTROSE 10 % IV SOLN: INTRAVENOUS | Status: DC

## 2023-04-10 MED ORDER — DEXTROSE-SODIUM CHLORIDE 5-0.9 % IV SOLN: INTRAVENOUS | Status: DC

## 2023-04-10 MED ORDER — ONDANSETRON 4 MG PO TBDP: 4.0000 mg | ORAL_TABLET | Freq: Four times a day (QID) | ORAL | Status: DC | PRN

## 2023-04-10 MED ORDER — LORAZEPAM 2 MG/ML PO CONC: 0.5000 mg | ORAL | Status: DC | PRN

## 2023-04-10 MED ORDER — ACETAMINOPHEN 325 MG PO TABS: 650.0000 mg | ORAL_TABLET | Freq: Four times a day (QID) | ORAL | Status: DC | PRN

## 2023-04-10 MED ORDER — DEXTROSE 50 % IV SOLN
INTRAVENOUS | Status: AC
  Administered 2023-04-10: 25 g via INTRAVENOUS
  Filled 2023-04-10: qty 50

## 2023-04-10 NOTE — IPAL (Signed)
  Interdisciplinary Goals of Care Family Meeting   Date carried out: 04/10/2023  Location of the meeting: Phone conference  Member's involved: Physician and Family Member or next of kin  Durable Power of Attorney or acting medical decision maker: Son  Discussion: We discussed goals of care for [employer] .   I have reviewed medical records including EPIC notes, labs and imaging, assessed the patient and then met with son to discuss major active diagnoses, plan of care, natural trajectory, prognosis, GOC, EOL wishes, disposition and options including Full code/DNI/DNR and the concept of comfort care if DNR is elected. Questions and concerns were addressed. They are  in agreement to continue current plan of care . Election for DNR status.   Code status:   Code Status: DNR   Disposition: In-patient comfort care  Time spent for the meeting: 30    Andris Baumann, MD  04/10/2023, 10:00 PM

## 2023-04-10 NOTE — ED Provider Notes (Signed)
[facility] Provider Note    Event Date/Time   First MD Initiated Contact with Patient 04/10/23 1717     (approximate)   History   Altered Mental Status   HPI [name] [name] is a 82 y.o. male with prostate cancer stage IV with metastatic lesions to the bone, HTN, HLD, on hospice care who presents today for altered mental status.  Wife found patient unresponsive at home and EMS was called.  He was found to have a blood sugar of 29.  He was given glucagon initially and then D10 with repeat blood glucose of 110.  He was more alert afterwards.  While in triage, patient had another episode of unresponsiveness and was found to have a blood sugar less than 10.  He was given 2 A of dextrose with resultant blood glucose of 157.  During my discussion, patient is alert and oriented.  Denying any acute symptom complaints at this time.  Is aware that he is at the hospital because his blood sugar went low.     Physical Exam   Triage Vital Signs: ED Triage Vitals  Encounter Vitals Group     BP 04/10/23 1500 111/72     Systolic BP Percentile --      Diastolic BP Percentile --      Pulse Rate 04/10/23 1500 (!) 102     Resp 04/10/23 1500 (!) 21     Temp 04/10/23 1500 98.1 F (36.7 C)     Temp Source 04/10/23 1500 Oral     SpO2 04/10/23 1500 93 %     Weight 04/10/23 1501 90 lb (40.8 kg)     Height --      Head Circumference --      Peak Flow --      Pain Score 04/10/23 1501 6     Pain Loc --      Pain Education --      Exclude from Growth Chart --     Most recent vital signs: Vitals:   04/11/23 0709 04/11/23 1108  BP: (!) 85/62 (!) 85/63  Pulse: 98 (!) 104  Resp: 11 11  Temp:    SpO2: 99% 99%   Physical Exam Constitutional:      Comments: Cachectic and frail-appearing with low body weight  HENT:     Nose: Nose normal.     Mouth/Throat:     Mouth: Mucous membranes are dry.     Pharynx: Oropharynx is clear.  Eyes:     Extraocular Movements: Extraocular  movements intact.     Conjunctiva/sclera: Conjunctivae normal.     Pupils: Pupils are equal, round, and reactive to light.  Cardiovascular:     Rate and Rhythm: Normal rate and regular rhythm.     Pulses: Normal pulses.  Pulmonary:     Effort: Pulmonary effort is normal. No respiratory distress.     Breath sounds: Rhonchi present.  Abdominal:     General: Abdomen is flat.     Palpations: Abdomen is soft.     Tenderness: There is no abdominal tenderness.  Genitourinary:    Comments: Stage I decubitus ulcer present on sacral region Musculoskeletal:     Comments: Significant muscle atrophy throughout  Skin:    Capillary Refill: Capillary refill takes less than 2 seconds.  Neurological:     General: No focal deficit present.     Mental Status: He is alert.       ED Results / Procedures / Treatments  Labs (all labs ordered are listed, but only abnormal results are displayed) Labs Reviewed  CBG MONITORING, ED - Abnormal; Notable for the following components:      Result Value   Glucose-Capillary <10 (*)    All other components within normal limits  CBG MONITORING, ED - Abnormal; Notable for the following components:   Glucose-Capillary 157 (*)    All other components within normal limits  CBG MONITORING, ED - Abnormal; Notable for the following components:   Glucose-Capillary 18 (*)    All other components within normal limits  CBG MONITORING, ED - Abnormal; Notable for the following components:   Glucose-Capillary 111 (*)    All other components within normal limits  CBG MONITORING, ED - Abnormal; Notable for the following components:   Glucose-Capillary 111 (*)    All other components within normal limits  CBG MONITORING, ED - Abnormal; Notable for the following components:   Glucose-Capillary 115 (*)    All other components within normal limits  CBG MONITORING, ED - Abnormal; Notable for the following components:   Glucose-Capillary 107 (*)    All other components  within normal limits  CBG MONITORING, ED - Abnormal; Notable for the following components:   Glucose-Capillary 101 (*)    All other components within normal limits  CBG MONITORING, ED - Abnormal; Notable for the following components:   Glucose-Capillary 103 (*)    All other components within normal limits  CBG MONITORING, ED  CBG MONITORING, ED     EKG    RADIOLOGY    PROCEDURES:  Critical Care performed: No  Procedures   MEDICATIONS ORDERED IN ED: Medications  acetaminophen (TYLENOL) tablet 650 mg (has no administration in time range)    Or  acetaminophen (TYLENOL) suppository 650 mg (has no administration in time range)  haloperidol (HALDOL) tablet 0.5 mg (has no administration in time range)    Or  haloperidol (HALDOL) 2 MG/ML solution 0.5 mg (has no administration in time range)    Or  haloperidol lactate (HALDOL) injection 0.5 mg (has no administration in time range)  ondansetron (ZOFRAN-ODT) disintegrating tablet 4 mg (has no administration in time range)    Or  ondansetron (ZOFRAN) injection 4 mg (has no administration in time range)  glycopyrrolate (ROBINUL) tablet 1 mg (has no administration in time range)    Or  glycopyrrolate (ROBINUL) injection 0.2 mg (has no administration in time range)    Or  glycopyrrolate (ROBINUL) injection 0.2 mg (has no administration in time range)  antiseptic oral rinse (BIOTENE) solution 15 mL (has no administration in time range)  polyvinyl alcohol (LIQUIFILM TEARS) 1.4 % ophthalmic solution 1 drop (has no administration in time range)  oxyCODONE (Oxy IR/ROXICODONE) immediate release tablet 5 mg (5 mg Oral Given 04/10/23 2109)  LORazepam (ATIVAN) tablet 1 mg (has no administration in time range)    Or  LORazepam (ATIVAN) 2 MG/ML concentrated solution 1 mg (has no administration in time range)    Or  LORazepam (ATIVAN) 2 MG/ML concentrated solution 0.5 mg (has no administration in time range)  morphine (PF) 2 MG/ML injection  1 mg (1 mg Intravenous Given 04/10/23 2253)  dextrose 50 % solution 25 g (25 g Intravenous Given 04/10/23 2050)  dextrose 10 % infusion ( Intravenous Infusion Verify 04/11/23 0524)  dextrose 50 % solution 100 mL (100 mLs Intravenous Given 04/10/23 1707)  morphine (PF) 4 MG/ML injection 4 mg (4 mg Intravenous Given 04/10/23 2032)     IMPRESSION / MDM /  ASSESSMENT AND PLAN / ED COURSE  I reviewed the triage vital signs and the nursing notes.                              Differential diagnosis includes, but is not limited to, severe malnutrition, hypoglycemia, end-stage cancer, failure to thrive.  Patient's presentation is most consistent with acute presentation with potential threat to life or bodily function.  Patient is an 82 year old male on hospice for stage IV prostate cancer with metastatic disease presenting today for hypoglycemia at home.  Although patient is on hospice care, family still elected to treat his hypoglycemia and bring him to the hospital.  Wife at home states she is unable to care for him anymore despite him being on hospice care.  Discussed the case with hospice, wife, and son.  Also discussed the case with patient himself now that he is more responsive after getting blood sugar back to normal range.  They would like him to come into the hospital at this time on comfort care measures.  They would like blood sugar to be checked overnight and him to be given blood glucose.  They want him to remain a DNR.  No further medical care outside of comfort care measures at this time.  They were rediscussed with social work in the morning regarding his ultimate disposition.  Patient was admitted to hospitalist for further care.  The patient is on the cardiac monitor to evaluate for evidence of arrhythmia and/or significant heart rate changes. Clinical Course as of 04/11/23 1232  Sun Apr 10, 2023  1727 Blood sugar in triage found to be less than 10.  Patient was given 2 A of dextrose.  Blood  sugar now 157.  Will continue to monitor his blood sugar and keep him on telemetry until I can discuss the case with hospice and other family members. [DW]  27 Spoke with the patient's wife on the phone.  She feels she is unable to care for him at home anymore.  She would like for patient to be admitted to inpatient hospice care especially given today's circumstance. [DW]  1800 Spoke with patient's son on the phone as well.  He was aware of today's events.  He has been on the phone with hospice social worker and nurse who are also aware.  I am awaiting callback at this time to discuss whether patient needs full inpatient hospice at this point for end of life care. [DW]  1811 Spoke with hospice care. Wife already spoke with them. They tried to keep patient at home, but wife refused for him to stay there any longer. Hospital liaison team will come by and see him.  She will call family members and decide next point of action. [DW]  1854 Hospice - patient can be admitted to hospice home under a routine level of care. There is a $350 per day fee that family may be responsible for unless they qualify for reduction. Would need SW/CM for that. [DW]  14 Spoke with son.  He agrees with his current wishes of his father right now to only check blood sugar and give him support with this as needed.  No other labs drawn or additional medications outside of comfort to be given.  Still wishes for him to be DNR.  Will have social work on consult in the morning to discuss with hospice for long-term plan.  Will admit to hospitalist for comfort care. [  DW]    Clinical Course User Index [DW] Janith Lima, MD     FINAL CLINICAL IMPRESSION(S) / ED DIAGNOSES   Final diagnoses:  Hospice care patient  Hypoglycemia  Acute encephalopathy     Rx / DC Orders   ED Discharge Orders     None        Note:  This document was prepared using Dragon voice recognition software and may include unintentional dictation  errors.   Janith Lima, MD 04/11/23 [phone]

## 2023-04-10 NOTE — Assessment & Plan Note (Deleted)
Comfort measures only Metastatic prostate cancer Continue D10 with CBG frequency based on CBG level starting at every hours then spacing out as needed Continue other comfort care meds and measures Spoke with son and POA at length They would like [place] to continue to check blood sugars and give glucose as needed They would like no further treatment, no diagnostic workup Plan for [facility] consult in the a.m. to coordinate for transfer to hospice house

## 2023-04-10 NOTE — H&P (Signed)
History and Physical    Patient: [name] [name] JJO:[id number] DOB: 06/07/1941 DOA: 04/10/2023 DOS: the patient was seen and examined on 04/10/2023 PCP: Patient, No Pcp Per  Patient coming from: Home  Chief Complaint:  Chief Complaint  Patient presents with   Altered Mental Status    HPI: [name] [name] is a 82 y.o. 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  History given by his son and POA over the phone.  Patient is on hospice with visits twice weekly. He is DNR/DNI status.  At baseline he is frail and weak  but awake and alert  however was brought in by EMS due to minimal unresponsiveness.  Blood glucose with EMS was 29 and he was administered glucagon.  Narcan was also administered.   ED course and data review: Upon arrival to the hospital he was still unresponsive and " guppy breathing". Vitals were within normal limits except for mild tachycardia of 102. Repeat CBG was 10 and he was administered 2 ampoules of D50.  And he was placed on D5 The ED provider spoke with patient's wife on the phone and she did not feel comfortable taking patient back home and she expressed the desire for patient to be admitted to inpatient hospice.  Hospice team recommended admission to the hospital and they will evaluate him for admission No labs or imaging was done per family request but they requested correcting blood glucose.  Hospitalist consulted for admission. Following admission,Blood sugar soon Trending down to 78>18 and a third amp of D50 was ordered and patient switched to D10.     Past Medical History:  Diagnosis Date   Hyperlipidemia    Hypertension    Prostatitis    Past Surgical History:  Procedure Laterality Date   BACK SURGERY     Social History:  reports that he has never smoked. He has never  used smokeless tobacco. He reports current alcohol use. He reports that he does not currently use drugs.  No Known Allergies  No family history on file.  Prior to Admission medications   Medication Sig Start Date End Date Taking? Authorizing Provider  acetaminophen (TYLENOL) 325 MG tablet Take 2 tablets (650 mg total) by mouth every 6 (six) hours as needed for mild pain or headache (fever >/= 101). 07/27/19   Lonia Blood, MD  allopurinol (ZYLOPRIM) 100 MG tablet Take 100 mg by mouth daily. 06/01/19   [provider]  atorvastatin (LIPITOR) 10 MG tablet Take 10 mg by mouth daily at 6 PM.    [provider]  darolutamide (NUBEQA) 300 MG tablet Take 600 mg by mouth 2 (two) times daily. 06/28/19   [provider]  famotidine (PEPCID) 20 MG tablet Take 20 mg by mouth daily.    [provider]  oxyCODONE (OXY IR/ROXICODONE) 5 MG immediate release tablet Take 5 mg by mouth 2 (two) times daily as needed for pain. 06/21/19   [provider]  potassium chloride SA (KLOR-CON) 20 MEQ tablet Take 20 mEq by mouth daily.    [provider]    Physical Exam: Vitals:   04/10/23 1740 04/10/23 1812 04/10/23 2000 04/10/23 2015  BP:   107/77   Pulse:   (!) 117 (!) 111  Resp:   17 18  Temp:  97.7 F (36.5 C)    TempSrc:  Oral    SpO2: 100%  100% 100%  Weight:       Physical Exam Vitals and nursing note reviewed.  Constitutional:      General: He is awake. He is not in acute distress.    Appearance: He is cachectic.     Comments: Patient now awake and alert.  Very frail-appearing  HENT:     Head: Normocephalic and atraumatic.  Cardiovascular:     Rate and Rhythm: Regular rhythm. Tachycardia present.     Heart sounds: Normal heart sounds.  Pulmonary:     Effort: Pulmonary effort is normal.     Breath sounds: Normal breath sounds.  Abdominal:     Palpations: Abdomen is soft.     Tenderness: There is no abdominal tenderness.  Neurological:      Mental Status: Mental status is at baseline.     Labs on Admission: I have personally reviewed following labs and imaging studies  CBC: No results for input(s): "WBC", "NEUTROABS", "HGB", "HCT", "MCV", "PLT" in the last 168 hours. Basic Metabolic Panel: No results for input(s): "NA", "K", "CL", "CO2", "GLUCOSE", "BUN", "CREATININE", "CALCIUM", "MG", "PHOS" in the last 168 hours. GFR: CrCl cannot be calculated (Patient's most recent lab result is older than the maximum 21 days allowed.). Liver Function Tests: No results for input(s): "AST", "ALT", "ALKPHOS", "BILITOT", "PROT", "ALBUMIN" in the last 168 hours. No results for input(s): "LIPASE", "AMYLASE" in the last 168 hours. No results for input(s): "AMMONIA" in the last 168 hours. Coagulation Profile: No results for input(s): "INR", "PROTIME" in the last 168 hours. Cardiac Enzymes: No results for input(s): "CKTOTAL", "CKMB", "CKMBINDEX", "TROPONINI" in the last 168 hours. BNP (last 3 results) No results for input(s): "PROBNP" in the last 8760 hours. HbA1C: No results for input(s): "HGBA1C" in the last 72 hours. CBG: Recent Labs  Lab 04/10/23 1719 04/10/23 1811 04/10/23 1925 04/10/23 2046 04/10/23 2124  GLUCAP 157* 99 70 18* 111*   Lipid Profile: No results for input(s): "CHOL", "HDL", "LDLCALC", "TRIG", "CHOLHDL", "LDLDIRECT" in the last 72 hours. Thyroid Function Tests: No results for input(s): "TSH", "T4TOTAL", "FREET4", "T3FREE", "THYROIDAB" in the last 72 hours. Anemia Panel: No results for input(s): "VITAMINB12", "FOLATE", "FERRITIN", "TIBC", "IRON", "RETICCTPCT" in the last 72 hours. Urine analysis:    Component Value Date/Time   COLORURINE YELLOW (A) 07/23/2019 2047   APPEARANCEUR HAZY (A) 07/23/2019 2047   APPEARANCEUR Cloudy 11/06/2013 1528   LABSPEC 1.014 07/23/2019 2047   LABSPEC 1.010 11/06/2013 1528   PHURINE 5.0 07/23/2019 2047   GLUCOSEU NEGATIVE 07/23/2019 2047   GLUCOSEU Negative 11/06/2013  1528   HGBUR MODERATE (A) 07/23/2019 2047   BILIRUBINUR NEGATIVE 07/23/2019 2047   BILIRUBINUR Negative 11/06/2013 1528   KETONESUR NEGATIVE 07/23/2019 2047   PROTEINUR NEGATIVE 07/23/2019 2047   NITRITE NEGATIVE 07/23/2019 2047   LEUKOCYTESUR NEGATIVE 07/23/2019 2047   LEUKOCYTESUR Trace 11/06/2013 1528    Radiological Exams on Admission: No results found.   Data Reviewed: Relevant notes from primary care and specialist visits, past discharge summaries as available in EHR, including Care Everywhere. Prior diagnostic testing as pertinent to current admission diagnoses Updated medications and problem lists for reconciliation ED course, including vitals, labs, imaging, treatment and response to treatment Triage notes, nursing and pharmacy notes and ED provider's notes Notable results as noted in HPI   Assessment and Plan: * Hypoglycemic coma (HCC) Comfort measures only Metastatic prostate cancer Hypoglycemic, resolved.  Patient now awake Continue D10 with CBG frequency based on CBG level  starting at every hours then spacing out as needed Continue other comfort care meds and measures Spoke with son and POA at length They would like [place] to continue to check blood sugars and give glucose as needed They would like no further treatment, no diagnostic workup Plan for [facility] consult in the a.m. to coordinate for transfer to hospice house   HTN (hypertension) No meds as patient is comfort care    DVT prophylaxis: none  Consults: none  Advance Care Planning:   Code Status: DNR   Family Communication: son and POA.  Discussed patient's wishes, plan of care.  Disposition Plan: Back to previous home environment  Severity of Illness: The appropriate patient status for this patient is OBSERVATION. Observation status is judged to be reasonable and necessary in order to provide the required intensity of service to ensure the patient's safety. The patient's presenting symptoms, physical  exam findings, and initial radiographic and laboratory data in the context of their medical condition is felt to place them at decreased risk for further clinical deterioration. Furthermore, it is anticipated that the patient will be medically stable for discharge from the hospital within 2 midnights of admission.   Author: Andris Baumann, MD 04/10/2023 9:43 PM  For on call review [email or web address].

## 2023-04-10 NOTE — ED Notes (Addendum)
Pt is still unresponsive but is now moving ext voluntarily. Pt resp have returned to normal at this time.

## 2023-04-10 NOTE — ED Notes (Addendum)
Pt A&Ox4. PA was at bedside contacting family to figure out POC since patient is on hospice. Pt most recent CBG 157. Vitals currently stable except O2 was dipping into high 80s and put patient on 2L [place] and is at 100%. Pt was also complaining of pain on buttocks. Pt has small skin breakdown on bony prominences of buttocks. Pt incontinent of urine and stool and pt cleaned up by this RN and Judeth Cornfield, NT. Family members at bedside. Pillow placed under buttocks per patient request.

## 2023-04-10 NOTE — ED Notes (Addendum)
Pt was found to be unresponsive by this RN with guppy breathing. RN checked pt BGL and the meter said " Lo" RN gave two amps of D50.

## 2023-04-10 NOTE — Assessment & Plan Note (Signed)
Comfort measures only Metastatic prostate cancer Continue D10 with CBG frequency based on CBG level starting at every hours then spacing out as needed Continue other comfort care meds and measures Spoke with son and POA at length They would like [place] to continue to check blood sugars and give glucose as needed They would like no further treatment, no diagnostic workup Plan for [facility] consult in the a.m. to coordinate for transfer to hospice house

## 2023-04-10 NOTE — ED Triage Notes (Addendum)
EMS called to home, unresonsive.  CBG:  29, 1 mg Glucagon given.  Left forearm, 20 g - D10 given.  Repeat CBG:  110.  3 mg Narcan given as well.  Hx cancer.  Hospice care.No DNT found at residence.

## 2023-04-10 NOTE — ED Triage Notes (Signed)
Refer to First Nurse Note at [phone]

## 2023-04-10 NOTE — Assessment & Plan Note (Signed)
No meds as patient is comfort care

## 2023-04-11 DIAGNOSIS — C61 Malignant neoplasm of prostate: Secondary | ICD-10-CM | POA: Diagnosis not present

## 2023-04-11 DIAGNOSIS — E43 Unspecified severe protein-calorie malnutrition: Secondary | ICD-10-CM | POA: Diagnosis not present

## 2023-04-11 DIAGNOSIS — E15 Nondiabetic hypoglycemic coma: Secondary | ICD-10-CM

## 2023-04-11 LAB — CBG MONITORING, ED
Glucose-Capillary: 101 mg/dL — ABNORMAL HIGH (ref 70–99)
Glucose-Capillary: 103 mg/dL — ABNORMAL HIGH (ref 70–99)
Glucose-Capillary: 107 mg/dL — ABNORMAL HIGH (ref 70–99)
Glucose-Capillary: 115 mg/dL — ABNORMAL HIGH (ref 70–99)
Glucose-Capillary: 134 mg/dL — ABNORMAL HIGH (ref 70–99)
Glucose-Capillary: 45 mg/dL — ABNORMAL LOW (ref 70–99)

## 2023-04-11 LAB — GLUCOSE, CAPILLARY
Glucose-Capillary: 114 mg/dL — ABNORMAL HIGH (ref 70–99)
Glucose-Capillary: 74 mg/dL (ref 70–99)

## 2023-04-11 MED ORDER — PREDNISONE 10 MG PO TABS
10.0000 mg | ORAL_TABLET | Freq: Two times a day (BID) | ORAL | Status: DC
  Administered 2023-04-11 – 2023-04-15 (×6): 10 mg via ORAL
  Filled 2023-04-11 (×6): qty 1

## 2023-04-11 NOTE — Hospital Course (Addendum)
[name] [name] is a 82 y.o. 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  Hemoglobin was 10, he was given D50, placed on D10. Discussed with hospice, patient cannot go back to the facility until glucose is more stable. However, patient condition is deteriorating, glucose cannot maintain even with D10 and prednisone.  Discussed with patient POA, his son in the evening of 8/23, OK to keep patient comfortable and discontinue all active treatment including D10 and steroids. Anticipating death in 24 to 48 hours.

## 2023-04-11 NOTE — ED Notes (Signed)
BG improved to 134. Transport arrived to transport pt. Pt alert and stable at time of departure.

## 2023-04-11 NOTE — Progress Notes (Addendum)
[facility] Hospitalized Hospice Patient  [name] is a current hospice patient followed at home for terminal diagnosis of metastatic prostate cancer. Patient was admitted to [facility] on 8.18.24 with dx hypoglycemia.  Per Dr. Alphonsus Sias, hospice physician, this is a related hospitalization.  Wife notified our agency that she called EMS because patient was unresponsive.  BS for EMS was 29 and they gave patient glucagon and also dosed with narcan.  On arrival to the ED, patient remained unresponsive with minimal respiratory effort.  Patient was given 2 AMPs of D50 and started on D5 gtt, which was switched to D10 gtt due to continued low blood sugars.  Family wanted to focus on comfort with DNR in place and no testing other than correcting blood sugars and continued monitoring of sugars.  Wife also states she can no longer care for him at home.  Hospice SW has been working with the family on options for private caregivers to help assist in increased caregiver burden.  Patient remains GIP appropriate for symptom mangement of uncontrolled hypoglycemia requiring medication intervention and frequent skilled assessment to monitor effectiveness of treatment regimen.  Vital Signs:  BP 107/77, P-117, R-18, Oxi 100% on RA  Abnormal labs: Glucose: 8/18- 157,99,70,18,111  Diagnostics:  none  IV/PRN Meds:   D10 gtt @ 116ml/hour D50 @ 1301 (D50 on 8.18 @ 1707, 2050) Morphine 4mg  IV 2032 on 8.18 Morphine 2mg  IV 2253 on 8.18  Problem list:   *1.  Hypoglycemic coma (HCC) Comfort measures only Metastatic prostate cancer Hypoglycemic, resolved.  Patient now awake Continue D10 with CBG frequency based on CBG level starting at every hours then spacing out as needed Continue other comfort care meds and measures Spoke with son and POA at length They would like [place] to continue to check blood sugars and give glucose as needed They would like no further treatment, no  diagnostic workup Plan for [facility] consult in the a.m. to coordinate for transfer to hospice house    2.  HTN (hypertension) No meds as patient is comfort care  Discharge Planning:  Ongoing- hospice SW contacting patient's son regarding Hospice Home. Family contact:  No family at bedside.  No answer with call to wife. IDT:  Updated   Goals of Care- clear as stated above.  Remains DNR Day 1 only:  Medication list and Transfer Summary given to ED staff.  Norris Cross, RN Nurse Liaison [phone]

## 2023-04-11 NOTE — ED Notes (Signed)
Messaged MD regarding pt's BG. Pt given amp of D50 per PRN order. Pt is alert and non-symptomatic at this time.

## 2023-04-11 NOTE — Progress Notes (Signed)
  Progress Note   Patient: [name] [name] WUJ:[id number] DOB: 05/19/1941 DOA: 04/10/2023     1 DOS: the patient was seen and examined on 04/11/2023   Brief hospital course:  [name] [name] is a 82 y.o. 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  Hemoglobin was 10, he was given D50, placed on D5. Discussed with hospitalist, patient cannot go back to the facility until glucose is more stable.   Principal Problem:   Hypoglycemic coma (HCC) Active Problems:   HTN (hypertension)   Prostate cancer (HCC)   Comfort measures only status   Protein-calorie malnutrition, severe (HCC)   Assessment and Plan: * Hypoglycemic coma (HCC) Comfort measures only Metastatic prostate cancer Had severe hypoglycemia due to poor p.o. intake.  Currently placed on D10.  Discussed with hospice, the facility of hospice cannot take him until glucose is better. I will reduce flow rate of D10, added prednisone 10 mg twice a day.  Anticipating better glucose tomorrow, likely transfer back to hospice facility.  Severe protein calorie malnutrition. Continue comfort care.   HTN (hypertension) No meds as patient is comfort care       Subjective:  Patient is still confused, but no agitation.  Physical Exam: Vitals:   04/11/23 1108 04/11/23 1250 04/11/23 1300 04/11/23 1354  BP: (!) 85/63  (!) 85/62 (!) 86/62  Pulse: (!) 104 96 99 98  Resp: 11 20 20 18   Temp:   97.6 F (36.4 C) 97.6 F (36.4 C)  TempSrc:   Oral Oral  SpO2: 99% 98% 97% 97%  Weight:       General exam: Ill-appearing, cachectic. Respiratory system: Clear to auscultation. Respiratory effort normal. Cardiovascular system: S1 & S2 heard, RRR. No JVD, murmurs, rubs, gallops or clicks. No pedal edema. Gastrointestinal system: Abdomen is nondistended, soft  and nontender. No organomegaly or masses felt. Normal bowel sounds heard. Central nervous system: Alert and oriented. No focal neurological deficits. Extremities: Severe muscle atrophy. Skin: No rashes, lesions or ulcers Psychiatry: Mood & affect appropriate.    Data Reviewed:  Lab results reviewed.  Family Communication: Not able to reach family.  Disposition: Status is: Inpatient Remains inpatient appropriate because: Severity of disease, IV treatment.     Time spent: 35 minutes  Author: Marrion Coy, MD 04/11/2023 3:19 PM  For on call review [email or web address].

## 2023-04-11 NOTE — ED Notes (Signed)
RN  walked into patients room. Pt had soiled brief. IV was pulled out with IV fluids running into bed. Pt was alert and asking to be cleaned up. Linens changed on bed brief changed. Pressure wounds noted to upper back along spine as well as at sacrum. Wounds cleaned and mepilex applied. Shirts removed from patient and thrown in garbage due to them being cut. Pt placed in gown. New IV started and fluids restarted on new IV. Warm blankets provided. Call light within reach.

## 2023-04-12 DIAGNOSIS — E43 Unspecified severe protein-calorie malnutrition: Secondary | ICD-10-CM | POA: Diagnosis not present

## 2023-04-12 DIAGNOSIS — E15 Nondiabetic hypoglycemic coma: Secondary | ICD-10-CM | POA: Diagnosis not present

## 2023-04-12 DIAGNOSIS — C61 Malignant neoplasm of prostate: Secondary | ICD-10-CM | POA: Diagnosis not present

## 2023-04-12 LAB — GLUCOSE, CAPILLARY
Glucose-Capillary: 32 mg/dL — CL (ref 70–99)
Glucose-Capillary: 37 mg/dL — CL (ref 70–99)
Glucose-Capillary: 111 mg/dL — ABNORMAL HIGH (ref 70–99)
Glucose-Capillary: 121 mg/dL — ABNORMAL HIGH (ref 70–99)
Glucose-Capillary: 119 mg/dL — ABNORMAL HIGH (ref 70–99)
Glucose-Capillary: 133 mg/dL — ABNORMAL HIGH (ref 70–99)
Glucose-Capillary: 135 mg/dL — ABNORMAL HIGH (ref 70–99)
Glucose-Capillary: 135 mg/dL — ABNORMAL HIGH (ref 70–99)

## 2023-04-12 NOTE — Progress Notes (Signed)
Hypoglycemic Event  CBG: 37  Treatment: 8 oz juice/soda and D50 50 mL (25 gm)  Symptoms: None  Follow-up CBG: Time: 02:09 CBG Result: 111   Possible Reasons for Event: Inadequate meal intake  Comments/MD notified: Lindajo Royal, MD    Cherlyn Roberts

## 2023-04-12 NOTE — Progress Notes (Addendum)
  Progress Note   Patient: [name] [name] WUJ:[id number] DOB: [birth year] DOA: 04/10/2023     2 DOS: the patient was seen and examined on 04/12/2023   Brief hospital course:  Sholem Venhuizen is a 82 y.o. 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  Hemoglobin was 10, he was given D50, placed on D10. Discussed with hospitalist, patient cannot go back to the facility until glucose is more stable.   Principal Problem:   Hypoglycemic coma (HCC) Active Problems:   HTN (hypertension)   Prostate cancer (HCC)   Comfort measures only status   Protein-calorie malnutrition, severe (HCC)   Assessment and Plan:  Hypoglycemic coma (HCC) Comfort measures only Metastatic prostate cancer Had severe hypoglycemia due to poor p.o. intake.  Currently placed on D10.  Discussed with hospice, the facility of hospice cannot take him until glucose is better. I have started prednisone 10 mg twice a day, continued on D10 at 50 mL/h. It appears that patient still has a very poor appetite, glucose still running low. Given patient terminal cancer condition, maintaining normal glucose may becoming possible. Had a discussion with the patient son, given patient terminal status, infused glucose will be primarily consumed by cancer.  Given steroids to raise the glucose will accelerate the catabolic process, but may not be effective enough for the patient.  I will continue current treatment for 2 -3 days, at that time we have to decide if will transfer patient to inpatient hospice, otherwise patient might have to die in the hospital if we continue IV infusion of D10.   Severe protein calorie malnutrition. Continue comfort care.     HTN (hypertension) No meds as patient is comfort care        Subjective:  Patient has no confusion  today.  Physical Exam: Vitals:   04/11/23 1300 04/11/23 1354 04/11/23 2212 04/12/23 0925  BP: (!) 85/62 (!) 86/62 (!) 82/60 93/63  Pulse: 99 98 (!) 110 100  Resp: 20 18 20 18   Temp: 97.6 F (36.4 C) 97.6 F (36.4 C) 98.1 F (36.7 C) 97.8 F (36.6 C)  TempSrc: Oral Oral  Oral  SpO2: 97% 97% 100% 100%  Weight:       General exam: Appears calm and cachectic. Respiratory system: Clear to auscultation. Respiratory effort normal. Cardiovascular system: S1 & S2 heard, RRR. No JVD, murmurs, rubs, gallops or clicks. No pedal edema. Gastrointestinal system: Abdomen is nondistended, soft and nontender. No organomegaly or masses felt. Normal bowel sounds heard. Central nervous system: Alert and oriented. No focal neurological deficits. Extremities: Severe muscle atrophy. Skin: No rashes, lesions or ulcers Psychiatry: Judgement and insight appear normal. Mood & affect appropriate.    Data Reviewed:  Lab results reviewed.  Family Communication: Son updated. Disposition: Status is: Inpatient Remains inpatient appropriate because: Unsafe discharge with severe hypoglycemia.     Time spent: 35 minutes  Author: Marrion Coy, MD 04/12/2023 11:36 AM  For on call review [email or web address].

## 2023-04-12 NOTE — Progress Notes (Signed)
[facility] Liaison Note?   Mr. Bertran Girard is a current hospice patient followed at home for terminal diagnosis of metastatic prostate cancer. Patient was admitted to [facility] on 8.18.24 with hypoglycemia.  Per Dr. Alphonsus Sias, hospice physician, this is a related hospitalization. ?   Wife notified our agency that she called EMS because patient was unresponsive.  BS for EMS was 29 and they gave patient glucagon and also dosed with narcan.  On arrival to the ED, patient remained unresponsive with minimal respiratory effort.  Patient was given 2 AMPs of D50 and started on D5 gtt, which was switched to D10 gtt due to continued low blood sugars.  Family wanted to focus on comfort with DNR in place and no testing other than correcting blood sugars and continued monitoring of sugars.  Wife also states she can no longer care for him at home.  Hospice SW has been working with the family on options for private caregivers to help assist in increased caregiver burden.   Patient remains GIP appropriate for symptom mangement of uncontrolled hypoglycemia requiring medication intervention and frequent skilled assessment to monitor effectiveness of treatment regimen.   Met with patient at bedside, no family is present. Mr. [place] endorses "hurting all over". He shares that he has low blood sugars and that he has turned it over to the [place].  Called and spoke with son, [name], to discuss goals of care and disease process. We discussed what Dr. Chipper Herb had recommended, continuing to treat for the next couple of days and possibility for end of life care at [facility]. ?   Vital Signs:?Temp  97.8  BP 93/63, P-100, R-18, Ox 100% on 3L/min    I/O:  sips and bites?   Abnormal labs:  Glucose: 119 ?   Diagnostics:? none ?   IV/PRN Meds:  Dextrose 10% 60ml/hr cont IV  Dextrose 50% sol 25 mg prn for low bold sugar x2,  Morphine 1mg  q 2 hr prn IV X3,  oxycodone 5mg   q 4 hr prn po X 1 ?   Problem list:?   Assessment and Plan:  Hypoglycemic coma (HCC) Comfort measures only Metastatic prostate cancer Had severe hypoglycemia due to poor p.o. intake.  Currently placed on D10.  Discussed with hospice, the facility of hospice cannot take him until glucose is better. I have started prednisone 10 mg twice a day, continued on D10 at 50 mL/h. It appears that patient still has a very poor appetite, glucose still running low. Given patient terminal cancer condition, maintaining normal glucose may becoming possible. Had a discussion with the patient son, given patient terminal status, infused glucose will be primarily consumed by cancer.  Given steroids to raise the glucose will accelerate the catabolic process, but may not be effective enough for the patient.  I will continue current treatment for 2 -3 days, at that time we have to decide if will transfer patient to inpatient hospice, otherwise patient might have to die in the hospital if we continue IV infusion of D10.   Severe protein calorie malnutrition. Continue comfort care.     HTN (hypertension) No meds as patient is comfort care?   Discharge Planning:? Ongoing  Family contact: as above  IDT:? Updated?    Goals of Care- clear as stated above.  Remains DNR   Please call with any hospice related questions or concerns.  Thank you, Haynes Bast, BSN, [facility] Liaison  [phone]

## 2023-04-12 NOTE — Plan of Care (Signed)
Comfort care pt being treated for hypoglycemia.  Problem: Coping: Goal: Ability to identify and develop effective coping behavior will improve Outcome: Progressing   Problem: Respiratory: Goal: Verbalizations of increased ease of respirations will increase Outcome: Progressing   Problem: Pain Management: Goal: Satisfaction with pain management regimen will improve Outcome: Progressing

## 2023-04-12 NOTE — TOC Progression Note (Signed)
Transition of Care [facility]) - Progression Note    Patient Details  Name: [name] [name] MRN: [id number] Date of Birth: 01-Jan-1941  [facility] CM/SW Contact  Allena Katz, LCSW Phone Number: 04/12/2023, 11:59 AM  Clinical Narrative:   Pt admitted from [facility] hospice. Pt cannot admit back to facility until glucose is stable.          Expected Discharge Plan and Services                                               Social Determinants of Health (SDOH) Interventions SDOH Screenings   Food Insecurity: Patient Declined (04/11/2023)  Housing: Patient Declined (04/11/2023)  Transportation Needs: Patient Declined (04/11/2023)  Utilities: Patient Declined (04/11/2023)  Physical Activity: Insufficiently Active (07/25/2019)  Social Connections: Moderately Integrated (07/25/2019)  Stress: No Stress Concern Present (07/25/2019)  Tobacco Use: Low Risk  (07/06/2022)   Received from [facility], [facility]    Readmission Risk Interventions     No data to display

## 2023-04-13 DIAGNOSIS — E43 Unspecified severe protein-calorie malnutrition: Secondary | ICD-10-CM | POA: Diagnosis not present

## 2023-04-13 DIAGNOSIS — E15 Nondiabetic hypoglycemic coma: Secondary | ICD-10-CM | POA: Diagnosis not present

## 2023-04-13 DIAGNOSIS — C61 Malignant neoplasm of prostate: Secondary | ICD-10-CM | POA: Diagnosis not present

## 2023-04-13 LAB — GLUCOSE, CAPILLARY
Glucose-Capillary: 120 mg/dL — ABNORMAL HIGH (ref 70–99)
Glucose-Capillary: 140 mg/dL — ABNORMAL HIGH (ref 70–99)
Glucose-Capillary: 148 mg/dL — ABNORMAL HIGH (ref 70–99)
Glucose-Capillary: 164 mg/dL — ABNORMAL HIGH (ref 70–99)
Glucose-Capillary: 57 mg/dL — ABNORMAL LOW (ref 70–99)
Glucose-Capillary: 80 mg/dL (ref 70–99)
Glucose-Capillary: 87 mg/dL (ref 70–99)
Glucose-Capillary: 88 mg/dL (ref 70–99)

## 2023-04-13 MED ORDER — DEXTROSE 10 % IV SOLN: INTRAVENOUS | Status: DC

## 2023-04-13 MED ORDER — DEXTROSE 50 % IV SOLN
12.5000 g | INTRAVENOUS | Status: AC
  Administered 2023-04-13: 12.5 g via INTRAVENOUS
  Filled 2023-04-13: qty 50

## 2023-04-13 NOTE — Progress Notes (Signed)
[facility] 113 [name] [name] Liaison Note?    [name] is a current hospice patient followed at home for terminal diagnosis of metastatic prostate cancer. Patient was admitted to [name] on 8.18.24 with hypoglycemia.  Per Dr. Alphonsus [name], hospice physician, this is a related hospitalization. ?    Wife notified our agency that she called EMS because patient was unresponsive.  BS for EMS was 29 and they gave patient glucagon and also dosed with narcan.  On arrival to the ED, patient remained unresponsive with minimal respiratory effort.  Patient was given 2 AMPs of D50 and started on D5 gtt, which was switched to D10 gtt due to continued low blood sugars.  Family wanted to focus on comfort with DNR in place and no testing other than correcting blood sugars and continued monitoring of sugars.  Wife also states she can no longer care for him at home.  Hospice SW has been working with the family on options for private caregivers to help assist in increased caregiver burden.    Patient remains GIP appropriate for symptom management of uncontrolled hypoglycemia requiring medication intervention and frequent skilled assessment to monitor effectiveness of treatment regimen.    Met with patient at bedside.  No family present.  Patient lying in bed, with 02 in place.  Patient did not respond to HL calling his name.     . ?    Vital Signs:?Temp  97.2  BP 150/137, P-92, R-17, Ox 94% on 3L/min     I/O:  not recorded/900   Abnormal labs:  Glucose: 148   Diagnostics:? none ?    IV/PRN Meds:  Dextrose 50% sol 25 mg prn for low bold sugar x1,  Morphine 1mg  q 2 hr prn IV X2,  oxycodone 5mg  q 4 hr prn po X 1, Ativan 1mg  q 4 hr prn po X 1 ?    Problem list:?   Assessment and Plan:  Hypoglycemic coma (HCC) Comfort measures only Metastatic prostate cancer Had severe hypoglycemia due to poor p.o. intake.  Currently placed on D10.  Discussed with hospice, the  facility of hospice cannot take him until glucose is better. I have started prednisone 10 mg twice a day, continued on D10 at 50 mL/h. It appears that patient still has a very poor appetite, glucose still running low. Given patient terminal cancer condition, maintaining normal glucose may becoming possible. Had a discussion with the patient son, given patient terminal status, infused glucose will be primarily consumed by cancer.  Given steroids to raise the glucose will accelerate the catabolic process, but may not be effective enough for the patient.  I will continue current treatment for 2 -3 days, at that time we have to decide if will transfer patient to inpatient hospice, otherwise patient might have to die in the [name] if we continue IV infusion of D10.   Severe protein calorie malnutrition. Continue comfort care.     HTN (hypertension) No meds as patient is comfort care?    Discharge Planning:? Ongoing   Family contact: met with patient at bedside.     IDT:? Updated?     Goals of Care- clear as stated above.  Remains DNR    Please call with any hospice related questions or concerns.  [facility] Liaison [phone]

## 2023-04-13 NOTE — Progress Notes (Signed)
  Progress Note   Patient: [name] [name] VWU:[id number] DOB: [birth year] DOA: 04/10/2023     3 DOS: the patient was seen and examined on 04/13/2023   Brief hospital course:  [name] [name] is a 82 y.o. 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  Hemoglobin was 10, he was given D50, placed on D10. Discussed with hospitalist, patient cannot go back to the facility until glucose is more stable.   Principal Problem:   Hypoglycemic coma (HCC) Active Problems:   HTN (hypertension)   Prostate cancer (HCC)   Comfort measures only status   Protein-calorie malnutrition, severe (HCC)   Assessment and Plan: Hypoglycemic coma (HCC) Comfort measures only Metastatic prostate cancer Had severe hypoglycemia due to poor p.o. intake.  Currently placed on D10.  Discussed with hospice, the facility of hospice cannot take him until glucose is better. Patient is placed on steroids with prednisone 10 mg twice a day while reduce a D10 dose. Glucose appears to be better this morning, I discontinued D10, but the patient glucose could not sustain.  Received D50 again, restarted D10.  Will continue prednisone and D10 for the next 48 hours, then discuss about options if patient cannot stop D10.   Severe protein calorie malnutrition. Continue comfort care.     HTN (hypertension) No meds as patient is comfort care        Subjective:  Patient became very drowsy again, glucose dropped down to 75, received D50, restarted D10.  Physical Exam: Vitals:   04/11/23 1354 04/11/23 2212 04/12/23 0925 04/12/23 1641  BP: (!) 86/62 (!) 82/60 93/63 (!) 150/137  Pulse: 98 (!) 110 100 92  Resp: 18 20 18 17   Temp: 97.6 F (36.4 C) 98.1 F (36.7 C) 97.8 F (36.6 C) 98.2 F (36.8 C)  TempSrc: Oral  Oral   SpO2: 97% 100% 100% 94%   Weight:       General exam: Drowsy and cachectic. Respiratory system: Clear to auscultation. Respiratory effort normal. Cardiovascular system: S1 & S2 heard, RRR. No JVD, murmurs, rubs, gallops or clicks. No pedal edema. Gastrointestinal system: Abdomen is nondistended, soft and nontender. No organomegaly or masses felt. Normal bowel sounds heard. Central nervous system: Confused. Extremities: Severe muscle atrophy. Skin: No rashes, lesions or ulcers Psychiatry: flat affect  Data Reviewed:  Lab results reviewed.  Family Communication: None  Disposition: Status is: Inpatient Remains inpatient appropriate because: Severity of disease, IV treatment.     Time spent: 35 minutes  Author: Marrion Coy, MD 04/13/2023 11:27 AM  For on call review [email or web address].

## 2023-04-13 NOTE — Plan of Care (Signed)
Comfort care pt, being treated for hypoglycemia   Problem: Coping: Goal: Ability to identify and develop effective coping behavior will improve Outcome: Progressing   Problem: Respiratory: Goal: Verbalizations of increased ease of respirations will increase Outcome: Progressing   Problem: Role Relationship: Goal: Family's ability to cope with current situation will improve Outcome: Progressing Goal: Ability to verbalize concerns, feelings, and thoughts to partner or family member will improve Outcome: Progressing   Problem: Pain Management: Goal: Satisfaction with pain management regimen will improve Outcome: Progressing

## 2023-04-14 DIAGNOSIS — E15 Nondiabetic hypoglycemic coma: Secondary | ICD-10-CM | POA: Diagnosis not present

## 2023-04-14 DIAGNOSIS — C61 Malignant neoplasm of prostate: Secondary | ICD-10-CM | POA: Diagnosis not present

## 2023-04-14 DIAGNOSIS — E43 Unspecified severe protein-calorie malnutrition: Secondary | ICD-10-CM | POA: Diagnosis not present

## 2023-04-14 LAB — GLUCOSE, CAPILLARY
Glucose-Capillary: 109 mg/dL — ABNORMAL HIGH (ref 70–99)
Glucose-Capillary: 110 mg/dL — ABNORMAL HIGH (ref 70–99)
Glucose-Capillary: 131 mg/dL — ABNORMAL HIGH (ref 70–99)
Glucose-Capillary: 77 mg/dL (ref 70–99)
Glucose-Capillary: 80 mg/dL (ref 70–99)
Glucose-Capillary: 85 mg/dL (ref 70–99)

## 2023-04-14 NOTE — Progress Notes (Addendum)
[facility] Liaison Note?    [name] [name] is a current hospice patient followed at home for terminal diagnosis of metastatic prostate cancer. Patient was admitted to [facility] on 8.18.24 with hypoglycemia.  Per Dr. Alphonsus Sias, hospice physician, this is a related hospitalization. Wife notified our agency that she called EMS because patient was unresponsive.  BS for EMS was 29 and they gave patient glucagon and also dosed with narcan.  On arrival to the ED, patient remained unresponsive with minimal respiratory effort.  Patient was given 2 AMPs of D50 and started on D5 gtt, which was switched to D10 gtt due to continued low blood sugars.  Family wanted to focus on comfort with DNR in place and no testing other than correcting blood sugars and continued monitoring of sugars.  Wife also states she can no longer care for him at home.  Hospice SW has been working with the family on options for private caregivers to help assist in increased caregiver burden.   Met with patient and wife at bedside. Patient is awake and alert, not talking very much but does answer "No" when asked if he is ready to stop treatment and transition to the hospice home. Wife reports it is her desire for him to be moved as soon as possible. Reached out to patient's son to discuss situation. He reports that they were trying the glucose again for another day and then at that point would make the decision to transition to comfort.    Patient remains GIP appropriate for symptom management of uncontrolled hypoglycemia requiring medication intervention and frequent skilled assessment to monitor effectiveness of treatment regimen.    Vital Signs:?98/70/16    90/60    100% 2L nasal cannula  I/O:  2030/1200  Abnormal labs: Glucose: 109 Diagnostics:? none new IV/PRN Meds: D10 @75ml /H,  Morphine 1mg  V X2   Problem list:?   Hypoglycemic coma (HCC) Comfort measures only Metastatic  prostate cancer Had severe hypoglycemia due to poor p.o. intake.  Currently placed on D10.  Discussed with hospice, the facility of hospice cannot take him until glucose is better. Patient is placed on steroids with prednisone 10 mg twice a day while reduce a D10 dose. I tried discontinue D10 while on prednisone, glucose could not sustain. Currently, I believe patient is actively dying.  If we cannot discontinue D10, patient probably will die while receiving D10.  Will discuss with hospice again, to see if we can take patient.  Attempt to reach patient's son is unsuccessful, did speak with patient wife.  Severe protein calorie malnutrition. Continue comfort care.  HTN (hypertension) No meds as patient is comfort care  Discharge Planning:? Ongoing Family contact: met with patient at bedside, discussed with son over by the phone.   IDT:? Updated?   Goals of Care- clear as stated above.  Remains DNR  Please call with any hospice related questions or concerns. Thea Gist, BSN RN Hospice hospital liaison [phone]

## 2023-04-14 NOTE — Progress Notes (Signed)
  Progress Note   Patient: [name] [name] GEX:[id number] DOB: 1941-04-13 DOA: 04/10/2023     4 DOS: the patient was seen and examined on 04/14/2023   Brief hospital course:  [name] [name] is a 82 y.o. 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  Hemoglobin was 10, he was given D50, placed on D10. Discussed with hospitalist, patient cannot go back to the facility until glucose is more stable.   Principal Problem:   Hypoglycemic coma (HCC) Active Problems:   HTN (hypertension)   Prostate cancer (HCC)   Comfort measures only status   Protein-calorie malnutrition, severe (HCC)   Assessment and Plan: Hypoglycemic coma (HCC) Comfort measures only Metastatic prostate cancer Had severe hypoglycemia due to poor p.o. intake.  Currently placed on D10.  Discussed with hospice, the facility of hospice cannot take him until glucose is better. Patient is placed on steroids with prednisone 10 mg twice a day while reduce a D10 dose. I tried discontinue D10 while on prednisone, glucose could not sustain. Currently, I believe patient is actively dying.  If we cannot discontinue D10, patient probably will die while receiving D10.  Will discuss with hospice again, to see if we can take patient.  Attempt to reach patient's son is unsuccessful, did speak with patient wife.   Severe protein calorie malnutrition. Continue comfort care.     HTN (hypertension) No meds as patient is comfort care       Subjective:  Patient has some confusion, very poor appetite.  Very weak.  Physical Exam: Vitals:   04/12/23 0925 04/12/23 1641 04/13/23 1631 04/14/23 1142  BP: 93/63 (!) 150/137 100/66 90/60  Pulse: 100 92 (!) 104 (!) 107  Resp: 18 17  16   Temp: 97.8 F (36.6 C) 98.2 F (36.8 C)  98 F (36.7 C)  TempSrc: Oral      SpO2: 100% 94% 97% 100%  Weight:       General exam: Ill-appearing, severely malnourished, cachectic. Respiratory system: Clear to auscultation. Respiratory effort normal. Cardiovascular system: S1 & S2 heard, RRR. No JVD, murmurs, rubs, gallops or clicks. No pedal edema. Gastrointestinal system: Abdomen is nondistended, soft and nontender. No organomegaly or masses felt. Normal bowel sounds heard. Central nervous system: Drowsy and oriented x2. No focal neurological deficits. Extremities: Severe muscle atrophy. Skin: No rashes, lesions or ulcers Psychiatry: Flat affect   Data Reviewed:  Lab results reviewed.  Family Communication: Wife updated at bedside, could not reach son.  Disposition: Status is: Inpatient Remains inpatient appropriate because: Severity of disease, comfort care.     Time spent: 35 minutes  Author: Marrion Coy, MD 04/14/2023 1:04 PM  For on call review [email or web address].

## 2023-04-14 NOTE — Plan of Care (Signed)
Comfort care being treated for hypoglycemia.   Problem: Coping: Goal: Ability to identify and develop effective coping behavior will improve Outcome: Progressing   Problem: Role Relationship: Goal: Family's ability to cope with current situation will improve Outcome: Progressing Goal: Ability to verbalize concerns, feelings, and thoughts to partner or family member will improve Outcome: Progressing   Problem: Pain Management: Goal: Satisfaction with pain management regimen will improve Outcome: Progressing

## 2023-04-15 DIAGNOSIS — Z515 Encounter for palliative care: Principal | ICD-10-CM

## 2023-04-15 DIAGNOSIS — C61 Malignant neoplasm of prostate: Secondary | ICD-10-CM | POA: Diagnosis not present

## 2023-04-15 DIAGNOSIS — E15 Nondiabetic hypoglycemic coma: Secondary | ICD-10-CM | POA: Diagnosis not present

## 2023-04-15 DIAGNOSIS — E43 Unspecified severe protein-calorie malnutrition: Secondary | ICD-10-CM | POA: Diagnosis not present

## 2023-04-15 LAB — GLUCOSE, CAPILLARY
Glucose-Capillary: 76 mg/dL (ref 70–99)
Glucose-Capillary: 77 mg/dL (ref 70–99)
Glucose-Capillary: 78 mg/dL (ref 70–99)
Glucose-Capillary: 80 mg/dL (ref 70–99)
Glucose-Capillary: 84 mg/dL (ref 70–99)
Glucose-Capillary: 87 mg/dL (ref 70–99)

## 2023-04-15 NOTE — Progress Notes (Signed)
  Progress Note   Patient: [name] [name] YHC:[id number] DOB: 02-Jan-1941 DOA: 03/29/2023     5 DOS: the patient was seen and examined on 04/15/2023   Brief hospital course:  Alver Loporto is a 82 y.o. 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  Hemoglobin was 10, he was given D50, placed on D10. Discussed with hospitalist, patient cannot go back to the facility until glucose is more stable.   Principal Problem:   Hypoglycemic coma (HCC) Active Problems:   HTN (hypertension)   Prostate cancer (HCC)   Comfort measures only status   Protein-calorie malnutrition, severe (HCC)   Assessment and Plan: Hypoglycemic coma (HCC) Comfort measures only Metastatic prostate cancer Patient now become unresponsive with stable glucose on D10.  Patient is actively dying.  Anticipating death in 24 to 48 hours in hospital.  Continue comfort measure.   Severe protein calorie malnutrition. Continue comfort care.     HTN (hypertension) No meds as patient is comfort care        Subjective:  Patient is unresponsive today.  Physical Exam: Vitals:   04/13/23 1631 04/14/23 1142 04/14/23 2054 04/15/23 0738  BP: 100/66 90/60 (!) 87/62 (!) 87/61  Pulse: (!) 104 (!) 107 (!) 108 91  Resp:  16 16 16   Temp:  98 F (36.7 C) 97.6 F (36.4 C) (!) 97.5 F (36.4 C)  TempSrc:   Oral   SpO2: 97% 100% 96% 97%  Weight:       General exam: Ill-appearing, cachectic. Respiratory system: Clear to auscultation. Respiratory effort normal. Cardiovascular system: S1 & S2 heard, RRR. No JVD, murmurs, rubs, gallops or clicks. No pedal edema. Gastrointestinal system: Abdomen is nondistended, soft and nontender. No organomegaly or masses felt. Normal bowel sounds heard. Central nervous system: Unresponsive. Extremities: Severe  muscle atrophy. Skin: No rashes, lesions or ulcers    Data Reviewed:  Lab reviewed.  Family Communication: None  Disposition: Status is: Inpatient Remains inpatient appropriate because: Comfort care.     Time spent: 35 minutes  Author: Marrion Coy, MD 04/15/2023 11:15 AM  For on call review [email or web address].

## 2023-04-15 NOTE — Progress Notes (Signed)
[facility] Liaison Note?    [name] is a current hospice patient followed at home for terminal diagnosis of metastatic prostate cancer. Patient was admitted to [facility] on 8.18.24 with hypoglycemia.  Per Dr. Alphonsus Sias, hospice physician, this is a related hospitalization. ?    Wife notified our agency that she called EMS because patient was unresponsive.  BS for EMS was 29 and they gave patient glucagon and also dosed with narcan.  On arrival to the ED, patient remained unresponsive with minimal respiratory effort.  Patient was given 2 AMPs of D50 and started on D5 gtt, which was switched to D10 gtt due to continued low blood sugars.  Family wanted to focus on comfort with DNR in place and no testing other than correcting blood sugars and continued monitoring of sugars.  Wife also states she can no longer care for him at home.  Hospice SW has been working with the family on options for private caregivers to help assist in increased caregiver burden.    Patient remains GIP appropriate for symptom management of uncontrolled hypoglycemia requiring medication intervention and frequent skilled assessment to monitor effectiveness of treatment regimen.    Visited patient at the bedside today.  Patient asleep with 02 in place.  Appeared to be resting comfortably.  HL spoke with the son, [name] [name], today.  [name] stated that he has been trying to call Dr. Chipper Herb today because he and the family are thinking about discontinuing the D 10 continuous infusion. HL informed him that if they indeed discontinued the D10 infusion, that the Hospice Home most likely could approve the patient for symptom management -which would cover the cost of his care at the Hospice Home.  [name] stated that the patient did not want to go to the Hospice Home when asked yesterday.  He said that they may want to consider transfer there if patient is no longer able to make the  decision for himself.  HL sent Dr. Chipper Herb a message, asking that he call the son.          Vital Signs:?Temp  97.5  BP 87/61, P-91, R-16, Ox 97% on 3L/min     I/O:  679.1/not recorded   Abnormal labs: none   Diagnostics:? none ?    IV/PRN Meds:  D10 35ml/hr cont IV,  Morphine 1mg  q 2 hr prn IV X3, Ativan 1mg  po q 4 hr prn poX 1 ?    Problem list:?   Assessment and Plan:  Hypoglycemic coma (HCC) Comfort measures only Metastatic prostate cancer Had severe hypoglycemia due to poor p.o. intake.  Currently placed on D10.  Discussed with hospice, the facility of hospice cannot take him until glucose is better. I have started prednisone 10 mg twice a day, continued on D10 at 50 mL/h. It appears that patient still has a very poor appetite, glucose still running low. Given patient terminal cancer condition, maintaining normal glucose may becoming possible. Had a discussion with the patient son, given patient terminal status, infused glucose will be primarily consumed by cancer.  Given steroids to raise the glucose will accelerate the catabolic process, but may not be effective enough for the patient.  I will continue current treatment for 2 -3 days, at that time we have to decide if will transfer patient to inpatient hospice, otherwise patient might have to die in the hospital if we continue IV infusion of D10.   Severe protein calorie malnutrition. Continue comfort care.  HTN (hypertension) No meds as patient is comfort care?    Discharge Planning:? Ongoing   Family contact: met with patient at bedside. Spoke with the son today via phone.     IDT:? Updated?     Goals of Care- clear as stated above.  Remains DNR    Please call with any hospice related questions or concerns.   [facility] Liaison [phone]

## 2023-04-15 NOTE — TOC Progression Note (Signed)
Transition of Care [facility]) - Progression Note    Patient Details  Name: [name] [name] MRN: [id number] Date of Birth: 1940/10/20  Transition of [facility]) CM/SW Contact  Allena Katz, LCSW Phone Number: 04/15/2023, 2:27 PM  Clinical Narrative:   MD anticipates hospital death. TOC following for needs.          Expected Discharge Plan and Services                                               Social Determinants of Health (SDOH) Interventions SDOH Screenings   Food Insecurity: Patient Declined (04/11/2023)  Housing: Patient Declined (04/11/2023)  Transportation Needs: Patient Declined (04/11/2023)  Utilities: Patient Declined (04/11/2023)  Physical Activity: Insufficiently Active (07/25/2019)  Social Connections: Moderately Integrated (07/25/2019)  Stress: No Stress Concern Present (07/25/2019)  Tobacco Use: Low Risk  (07/06/2022)   Received from [facility], [facility]    Readmission Risk Interventions     No data to display

## 2023-04-15 NOTE — Plan of Care (Signed)
  Problem: Education: Goal: Knowledge of the prescribed therapeutic regimen will improve Outcome: Progressing   Problem: Coping: Goal: Ability to identify and develop effective coping behavior will improve Outcome: Progressing   Problem: Clinical Measurements: Goal: Quality of life will improve Outcome: Progressing   Problem: Respiratory: Goal: Verbalizations of increased ease of respirations will increase Outcome: Progressing   Problem: Role Relationship: Goal: Family's ability to cope with current situation will improve Outcome: Progressing Goal: Ability to verbalize concerns, feelings, and thoughts to partner or family member will improve Outcome: Progressing   Problem: Pain Management: Goal: Satisfaction with pain management regimen will improve Outcome: Progressing   Problem: Education: Goal: Knowledge of General Education information will improve Description: Including pain rating scale, medication(s)/side effects and non-pharmacologic comfort measures Outcome: Progressing   Problem: Health Behavior/Discharge Planning: Goal: Ability to manage health-related needs will improve Outcome: Progressing   Problem: Clinical Measurements: Goal: Ability to maintain clinical measurements within normal limits will improve Outcome: Progressing Goal: Will remain free from infection Outcome: Progressing Goal: Diagnostic test results will improve Outcome: Progressing Goal: Respiratory complications will improve Outcome: Progressing Goal: Cardiovascular complication will be avoided Outcome: Progressing   Problem: Activity: Goal: Risk for activity intolerance will decrease Outcome: Progressing   Problem: Nutrition: Goal: Adequate nutrition will be maintained Outcome: Progressing   Problem: Coping: Goal: Level of anxiety will decrease Outcome: Progressing   Problem: Elimination: Goal: Will not experience complications related to bowel motility Outcome:  Progressing Goal: Will not experience complications related to urinary retention Outcome: Progressing   Problem: Pain Managment: Goal: General experience of comfort will improve Outcome: Progressing   Problem: Safety: Goal: Ability to remain free from injury will improve Outcome: Progressing   Problem: Skin Integrity: Goal: Risk for impaired skin integrity will decrease Outcome: Progressing   

## 2023-04-16 DIAGNOSIS — E15 Nondiabetic hypoglycemic coma: Secondary | ICD-10-CM | POA: Diagnosis not present

## 2023-04-16 DIAGNOSIS — C61 Malignant neoplasm of prostate: Secondary | ICD-10-CM | POA: Diagnosis not present

## 2023-04-16 DIAGNOSIS — Z515 Encounter for palliative care: Secondary | ICD-10-CM | POA: Diagnosis not present

## 2023-04-16 LAB — GLUCOSE, CAPILLARY
Glucose-Capillary: 46 mg/dL — ABNORMAL LOW (ref 70–99)
Glucose-Capillary: 52 mg/dL — ABNORMAL LOW (ref 70–99)
Glucose-Capillary: 60 mg/dL — ABNORMAL LOW (ref 70–99)
Glucose-Capillary: 74 mg/dL (ref 70–99)

## 2023-04-16 MED ORDER — DEXTROSE 50 % IV SOLN: 12.5000 g | Freq: Once | INTRAVENOUS | Status: DC

## 2023-04-24 NOTE — Progress Notes (Signed)
  Progress Note   Patient: [name] [name] ZOX:[id number] DOB: 1941-02-06 DOA: 04/18/2023     6 DOS: the patient was seen and examined on 04/12/2023   Brief hospital course:  [name] [name] is a 82 y.o. 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  Hemoglobin was 10, he was given D50, placed on D10. Discussed with hospice, patient cannot go back to the facility until glucose is more stable. However, patient condition is deteriorating, glucose cannot maintain even with D10 and prednisone.  Discussed with patient POA, his son in the evening of 8/23, OK to keep patient comfortable and discontinue all active treatment including D10 and steroids. Anticipating death in 24 to 48 hours.    Principal Problem:   Hypoglycemic coma (HCC) Active Problems:   HTN (hypertension)   Prostate cancer (HCC)   Comfort measures only status   Protein-calorie malnutrition, severe (HCC)   Assessment and Plan:  Hypoglycemic coma (HCC) Comfort measures only Metastatic prostate cancer Patient now is unresponsive, glucose cannot be maintained on steroids and D10.  After discussion with patient's son, decision was made to discontinue D10 and steroids.  Patient is actively dying.  Anticipating inpatient death.   Severe protein calorie malnutrition. Continue comfort care.     HTN (hypertension) No meds as patient is comfort care     Subjective: Patient is unresponsive.  Physical Exam: Vitals:   04/14/23 2054 04/15/23 0738 04/03/2023 0538 04/07/2023 0743  BP: (!) 87/62 (!) 87/61 95/64 (!) 82/51  Pulse: (!) 108 91 95 100  Resp: 16 16 (!) 22 15  Temp: 97.6 F (36.4 C) (!) 97.5 F (36.4 C)    TempSrc: Oral     SpO2: 96% 97% (!) 87% 100%  Weight:       General exam: Cachectic Respiratory system: Clear to auscultation.  Respiratory effort normal. Cardiovascular system: Regular. No JVD, murmurs, rubs, gallops or clicks. No pedal edema. Gastrointestinal system: Abdomen is nondistended, soft and nontender. No organomegaly or masses felt. Normal bowel sounds heard. Central nervous system: Unresponsive. Extremities: Severe muscle atrophy Skin: No rashes, lesions or ulcers    Data Reviewed:  Reviewed glucose levels.  Family Communication: Discussed with the son yesterday evening.  Disposition: Status is: Inpatient Remains inpatient appropriate because: Comfort care.     Time spent: 35 minutes  Author: Marrion Coy, MD 04/03/2023 10:46 AM  For on call review [email or web address].

## 2023-04-24 NOTE — Progress Notes (Signed)
[facility] Hospitalized Hospice Patient  [name] is a current hospice patient followed at home for terminal diagnosis of metastatic prostate cancer. Patient was admitted to [facility] on 8.18.24 with hypoglycemia.  Per Dr. Alphonsus Sias, hospice physician, this is a related hospitalization. ?   Visit at bedside.  Patient unresponsive. Decision was made this morning to transition patient to full comfort measures stopping D10 infusion and steroids.  Patient is actively dying and expect hospital death.  Patient remains appropriate for GIP LOC requiring frequent skilled nursing assessment to monitor for signs of distress or symptom management.  Skilled teaching on end of life to family.  Vital Signs:?Temp  97.5  BP 82/51, P-100, R-15, Oxi 100% 2L/[place]    I/O:   Input:  IV : 679 ml Output: urine   Abnormal labs: none   Diagnostics:? none ?    IV/PRN Meds:     Problem list:?   Assessment and Plan:  Hypoglycemic coma (HCC) Comfort measures only Metastatic prostate cancer Goals of care discussion with patient's son by MD.  Glucose cannot be maintained with current regimen of steroids and D10 infusion.  Patient's son decided to discontinue D10 and steroids.  Patient is actively dying and expect hospital death.   Severe protein calorie malnutrition. Continue comfort care.     HTN (hypertension) No meds as patient is comfort care.  Patient is actually hypotensive   Discharge Planning:? Expect hospital death   Family contact: At bedside.   IDT:? Updated?     Goals of Care- clear as stated above.  Remains DNR    Please call with any hospice related questions or concerns.  Norris Cross, RN Nurse Liaison [phone]

## 2023-04-24 NOTE — Plan of Care (Signed)
  Problem: Clinical Measurements: Goal: Quality of life will improve Outcome: Progressing   Problem: Pain Management: Goal: Satisfaction with pain management regimen will improve Outcome: Progressing   

## 2023-04-24 NOTE — Plan of Care (Signed)
Called into pt room by primary RN, Wyatt Portela, to verify pt had passed.  Confirmed death at 19:45.  Spoke to pt's son [name] and pt's wife Luster Landsberg and offered sympathy.  They chose not to come in. Pt is not a tissue donor b/c of metastatic ca.  Informed on call NP and AC.  Funeral home is [facility].

## 2023-04-24 NOTE — Death Summary Note (Signed)
   DEATH SUMMARY   Patient Details  Name: [name] [name] MRN: [id number] DOB: 1941-05-09 [id number], No Pcp Per Admission/Discharge Information   Admit Date:  05/09/23  Date of Death: Date of Death: 15-May-2023  Time of Death: Time of Death: 1945  Length of Stay: 6   Principle Cause of death: Metastatic prostate cancer  Hospital Diagnoses: Principal Problem:   Hypoglycemic coma (HCC) Active Problems:   HTN (hypertension)   Prostate cancer (HCC)   Comfort measures only status   Protein-calorie malnutrition, severe [facility])   Hospital Course:  [name] [name] is a 82 y.o. 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  Hemoglobin was 10, he was given D50, placed on D10. Discussed with hospice, patient cannot go back to the facility until glucose is more stable. However, patient condition is deteriorating, glucose cannot maintain even with D10 and prednisone.  Discussed with patient POA, his son in the evening of 8/23, OK to keep patient comfortable and discontinue all active treatment including D10 and steroids. Anticipating death in 24 to 48 hours. Patient died in peace.  Assessment and Plan: Hypoglycemic coma (HCC) Comfort measures only Metastatic prostate cancer Patient now is unresponsive, glucose cannot be maintained on steroids and D10.  After discussion with patient's son, decision was made to discontinue D10 and steroids, continued comfort measures.   Severe protein calorie malnutrition.      HTN (hypertension) No meds as patient is comfort care       Procedures: none  Consultations: None  The results of significant diagnostics from this hospitalization (including imaging, microbiology, ancillary and laboratory) are listed below for reference.   Significant Diagnostic  Studies: No results found.  Microbiology: No results found for this or any previous visit (from the past 240 hour(s)).  Time spent:  minutes  Signed: Marrion Coy, MD 05-15-23

## 2023-04-24 DEATH — deceased
